# Patient Record
Sex: Male | Born: 1935 | Race: White | Hispanic: No | Smoking: Former smoker
Health system: Southern US, Community
[De-identification: ages and names within clinical notes are randomized; demographics above are authoritative.]

## PROBLEM LIST (undated history)

## (undated) DIAGNOSIS — R002 Palpitations: Secondary | ICD-10-CM

## (undated) DIAGNOSIS — Z9289 Personal history of other medical treatment: Secondary | ICD-10-CM

## (undated) DIAGNOSIS — H3530 Unspecified macular degeneration: Secondary | ICD-10-CM

## (undated) DIAGNOSIS — D509 Iron deficiency anemia, unspecified: Secondary | ICD-10-CM

## (undated) DIAGNOSIS — I10 Essential (primary) hypertension: Secondary | ICD-10-CM

## (undated) DIAGNOSIS — N529 Male erectile dysfunction, unspecified: Secondary | ICD-10-CM

## (undated) DIAGNOSIS — F0390 Unspecified dementia without behavioral disturbance: Secondary | ICD-10-CM

## (undated) DIAGNOSIS — E039 Hypothyroidism, unspecified: Secondary | ICD-10-CM

## (undated) DIAGNOSIS — R0789 Other chest pain: Secondary | ICD-10-CM

## (undated) DIAGNOSIS — Z87442 Personal history of urinary calculi: Secondary | ICD-10-CM

## (undated) DIAGNOSIS — E785 Hyperlipidemia, unspecified: Secondary | ICD-10-CM

## (undated) DIAGNOSIS — R9389 Abnormal findings on diagnostic imaging of other specified body structures: Secondary | ICD-10-CM

## (undated) DIAGNOSIS — I509 Heart failure, unspecified: Secondary | ICD-10-CM

## (undated) HISTORY — DX: Hyperlipidemia, unspecified: E78.5

## (undated) HISTORY — DX: Unspecified macular degeneration: H35.30

## (undated) HISTORY — DX: Palpitations: R00.2

## (undated) HISTORY — DX: Personal history of urinary calculi: Z87.442

## (undated) HISTORY — DX: Other chest pain: R07.89

## (undated) HISTORY — DX: Personal history of other medical treatment: Z92.89

## (undated) HISTORY — PX: CATARACT EXTRACTION: SUR2

## (undated) HISTORY — DX: Essential (primary) hypertension: I10

## (undated) HISTORY — DX: Male erectile dysfunction, unspecified: N52.9

---

## 1969-03-19 HISTORY — PX: THORACOTOMY: SUR1349

## 1969-03-19 HISTORY — PX: LOBECTOMY: SHX5089

## 1997-06-30 ENCOUNTER — Other Ambulatory Visit: Admission: RE | Admit: 1997-06-30 | Discharge: 1997-06-30 | Payer: Self-pay | Admitting: Cardiology

## 1998-08-01 ENCOUNTER — Emergency Department (HOSPITAL_COMMUNITY): Admission: EM | Admit: 1998-08-01 | Discharge: 1998-08-02 | Payer: Self-pay

## 1998-08-05 ENCOUNTER — Ambulatory Visit (HOSPITAL_COMMUNITY): Admission: RE | Admit: 1998-08-05 | Discharge: 1998-08-05 | Payer: Self-pay | Admitting: Gastroenterology

## 1998-08-05 ENCOUNTER — Encounter: Payer: Self-pay | Admitting: Gastroenterology

## 1998-08-08 ENCOUNTER — Encounter: Payer: Self-pay | Admitting: Urology

## 1998-08-08 ENCOUNTER — Ambulatory Visit (HOSPITAL_COMMUNITY): Admission: RE | Admit: 1998-08-08 | Discharge: 1998-08-08 | Payer: Self-pay | Admitting: Urology

## 1998-09-21 ENCOUNTER — Encounter: Payer: Self-pay | Admitting: Urology

## 1998-09-21 ENCOUNTER — Ambulatory Visit (HOSPITAL_COMMUNITY): Admission: RE | Admit: 1998-09-21 | Discharge: 1998-09-21 | Payer: Self-pay | Admitting: Urology

## 1998-12-20 ENCOUNTER — Ambulatory Visit (HOSPITAL_COMMUNITY): Admission: RE | Admit: 1998-12-20 | Discharge: 1998-12-20 | Payer: Self-pay | Admitting: Ophthalmology

## 2002-10-08 HISTORY — PX: CARDIOVASCULAR STRESS TEST: SHX262

## 2003-07-22 ENCOUNTER — Encounter: Admission: RE | Admit: 2003-07-22 | Discharge: 2003-07-22 | Payer: Self-pay | Admitting: Critical Care Medicine

## 2007-03-20 HISTORY — PX: COLONOSCOPY: SHX174

## 2007-11-18 ENCOUNTER — Ambulatory Visit: Payer: Self-pay | Admitting: Internal Medicine

## 2007-12-02 ENCOUNTER — Ambulatory Visit: Payer: Self-pay | Admitting: Internal Medicine

## 2007-12-02 ENCOUNTER — Encounter: Payer: Self-pay | Admitting: Internal Medicine

## 2007-12-03 ENCOUNTER — Encounter: Payer: Self-pay | Admitting: Internal Medicine

## 2009-03-19 DIAGNOSIS — F0390 Unspecified dementia without behavioral disturbance: Secondary | ICD-10-CM

## 2009-03-19 HISTORY — DX: Unspecified dementia, unspecified severity, without behavioral disturbance, psychotic disturbance, mood disturbance, and anxiety: F03.90

## 2009-12-12 ENCOUNTER — Ambulatory Visit: Payer: Self-pay | Admitting: Cardiology

## 2010-04-17 ENCOUNTER — Ambulatory Visit: Payer: Self-pay | Admitting: Cardiology

## 2010-08-08 ENCOUNTER — Other Ambulatory Visit: Payer: Self-pay | Admitting: *Deleted

## 2010-08-08 DIAGNOSIS — E039 Hypothyroidism, unspecified: Secondary | ICD-10-CM

## 2010-08-08 MED ORDER — LEVOTHYROXINE SODIUM 25 MCG PO TABS: ORAL_TABLET | ORAL | Status: DC

## 2010-08-08 NOTE — Telephone Encounter (Signed)
Refilled meds per fax request.  

## 2010-11-29 ENCOUNTER — Encounter: Payer: Self-pay | Admitting: Cardiology

## 2010-12-07 ENCOUNTER — Other Ambulatory Visit: Payer: Self-pay | Admitting: Cardiology

## 2010-12-07 ENCOUNTER — Other Ambulatory Visit (INDEPENDENT_AMBULATORY_CARE_PROVIDER_SITE_OTHER): Payer: Medicare Other | Admitting: *Deleted

## 2010-12-07 ENCOUNTER — Ambulatory Visit (INDEPENDENT_AMBULATORY_CARE_PROVIDER_SITE_OTHER): Payer: Medicare Other | Admitting: Cardiology

## 2010-12-07 ENCOUNTER — Encounter: Payer: Self-pay | Admitting: Cardiology

## 2010-12-07 VITALS — BP 120/76 | HR 70 | Wt 178.0 lb

## 2010-12-07 DIAGNOSIS — R0609 Other forms of dyspnea: Secondary | ICD-10-CM

## 2010-12-07 DIAGNOSIS — D3A090 Benign carcinoid tumor of the bronchus and lung: Secondary | ICD-10-CM | POA: Insufficient documentation

## 2010-12-07 DIAGNOSIS — R51 Headache: Secondary | ICD-10-CM

## 2010-12-07 DIAGNOSIS — R413 Other amnesia: Secondary | ICD-10-CM

## 2010-12-07 DIAGNOSIS — E785 Hyperlipidemia, unspecified: Secondary | ICD-10-CM

## 2010-12-07 DIAGNOSIS — F0390 Unspecified dementia without behavioral disturbance: Secondary | ICD-10-CM | POA: Insufficient documentation

## 2010-12-07 DIAGNOSIS — E7800 Pure hypercholesterolemia, unspecified: Secondary | ICD-10-CM | POA: Insufficient documentation

## 2010-12-07 DIAGNOSIS — R0602 Shortness of breath: Secondary | ICD-10-CM | POA: Insufficient documentation

## 2010-12-07 DIAGNOSIS — I10 Essential (primary) hypertension: Secondary | ICD-10-CM

## 2010-12-07 DIAGNOSIS — I119 Hypertensive heart disease without heart failure: Secondary | ICD-10-CM | POA: Insufficient documentation

## 2010-12-07 LAB — BASIC METABOLIC PANEL
CO2: 27 mEq/L (ref 19–32)
Chloride: 106 mEq/L (ref 96–112)
Creatinine, Ser: 1.1 mg/dL (ref 0.4–1.5)
Glucose, Bld: 91 mg/dL (ref 70–99)

## 2010-12-07 LAB — HEPATIC FUNCTION PANEL
Albumin: 4.2 g/dL (ref 3.5–5.2)
Alkaline Phosphatase: 72 U/L (ref 39–117)
Total Bilirubin: 0.7 mg/dL (ref 0.3–1.2)

## 2010-12-07 LAB — LIPID PANEL
LDL Cholesterol: 80 mg/dL (ref 0–99)
Total CHOL/HDL Ratio: 4
Triglycerides: 132 mg/dL (ref 0.0–149.0)

## 2010-12-07 NOTE — Progress Notes (Signed)
Michael Bradley Date of Birth:  Jul 12, 1925 [facility] [address]   [address] [place], [place]  [address] [phone]           Fax   [phone]  History of Present Illness: This pleasant elderly gentleman is seen for a scheduled six-month followup office visit he has a history of essential hypertension and history of hypercholesterolemia.  He does not have any history of coronary disease.  He had a normal Cardiolite stress test in 2004.  He does have some mild exertional dyspnea per his main problem has been increasing forgetfulness and his wife is very concerned about his memory.  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  He has a history of diverticulosis and had a recent colonoscopy by Dr. Lina Sar.  Current Outpatient Prescriptions  Medication Sig Dispense Refill  . aspirin 81 MG tablet Take 81 mg by mouth daily.        . cyclobenzaprine (FLEXERIL) 10 MG tablet Take 10 mg by mouth 2 (two) times daily as needed.        . galantamine (RAZADYNE) 8 MG tablet Take 8 mg by mouth daily.        . hydrochlorothiazide 25 MG tablet Take 12.5 mg by mouth daily.        Marland Kitchen levothyroxine (LEVOTHROID) 25 MCG tablet 1 tablet daily except 2 tablets on wed and sun  50 tablet  11  . lisinopril (PRINIVIL,ZESTRIL) 20 MG tablet Take 10 mg by mouth daily.        Marland Kitchen LORazepam (ATIVAN) 0.5 MG tablet Take 0.5 mg by mouth every 8 (eight) hours.        . meclizine (ANTIVERT) 25 MG tablet Take 25 mg by mouth daily. As needed      . Multiple Vitamin (MULTIVITAMIN) tablet Take 1 tablet by mouth daily.        . nitroGLYCERIN (NITROSTAT) 0.4 MG SL tablet Place 0.4 mg under the tongue every 5 (five) minutes as needed.        Marland Kitchen omeprazole (PRILOSEC) 20 MG capsule Take 20 mg by mouth daily.        . simvastatin (ZOCOR) 80 MG tablet Take 80 mg by mouth at bedtime.        Marland Kitchen terazosin (HYTRIN) 2 MG  capsule Take 2 mg by mouth at bedtime.        . vitamin C (ASCORBIC ACID) 500 MG tablet Take 500 mg by mouth daily.        . vitamin E 200 UNIT capsule Take 200 Units by mouth daily.          Allergies  Allergen Reactions  . Lisinopril Cough  . Quinolones     There is no problem list on file for this patient.   History  Smoking status  . Former Smoker  . Quit date: 11/28/1980  Smokeless tobacco  . Not on file    History  Alcohol Use No    Family History  Problem Relation Age of Onset  . Lung cancer Father   . Lung cancer Brother     Review of Systems: Constitutional: no fever chills diaphoresis or fatigue or change in weight.  Head and neck: no hearing loss, no epistaxis, no photophobia or visual disturbance. Respiratory: No cough, shortness of breath or wheezing. Cardiovascular: No chest pain peripheral edema, palpitations. Gastrointestinal: No  abdominal distention, no abdominal pain, no change in bowel habits hematochezia or melena. Genitourinary: No dysuria, no frequency, no urgency, no nocturia. Musculoskeletal:No arthralgias, no back pain, no gait disturbance or myalgias. Neurological: No dizziness, no headaches, no numbness, no seizures, no syncope, no weakness, no tremors. Hematologic: No lymphadenopathy, no easy bruising. Psychiatric: No confusion, no hallucinations, no sleep disturbance.    Physical Exam: Filed Vitals:   12/07/10 1337  BP: 120/76  Pulse: 70  The general appearance reveals a pleasant elderly gentleman in no distress.Pupils equal and reactive.   Extraocular Movements are full.  There is no scleral icterus.  The mouth and pharynx are normal.  The neck is supple.  The carotids reveal no bruits.  The jugular venous pressure is normal.  The thyroid is not enlarged.  There is no lymphadenopathy.    The chest reveals diminished breath sounds at the right base.  The heart reveals a soft systolic ejection murmur at the base.  No diastolic murmur.   No gallop or rub.The abdomen is soft and nontender. Bowel sounds are normal. The liver and spleen are not enlarged. There Are no abdominal masses. There are no bruits.  The pedal pulses are good.  There is no phlebitis or edema.  There is no cyanosis or clubbing.  Strength is normal and symmetrical in all extremities.  There is no lateralizing weakness.  There are no sensory deficits.  The skin is warm and dry.  There is no rash.     Assessment / Plan: Continue same medication.  Keep neurology appointment as scheduled.  Recheck here in 4 months for followup office visit and fasting lab work

## 2010-12-07 NOTE — Assessment & Plan Note (Signed)
The patient has a history of dementia.  His wife is very concerned about him.  We have scheduled him for a neurology consultation with Dr. Avie Echevaria In the near future.

## 2010-12-07 NOTE — Assessment & Plan Note (Signed)
Patient has a history of exertional dyspnea.  He has not been experiencing any sputum production.  He does have a past history of resection of his right middle lobe and right lower lobe because of a carcinoid lung tumor in 1971.

## 2010-12-07 NOTE — Assessment & Plan Note (Signed)
The patient has not been experiencing any exertional chest pain.  He denies any dizzy spells.  He does have occasional right-sided headaches.

## 2010-12-07 NOTE — Assessment & Plan Note (Signed)
The patient has a history of hypercholesterolemia.  He is presently on full dose simvastatin 80 mg daily a dose that he has been on for several years.His LDL cholesterol today is 80

## 2010-12-08 ENCOUNTER — Telehealth: Payer: Self-pay | Admitting: Cardiology

## 2010-12-08 ENCOUNTER — Telehealth: Payer: Self-pay | Admitting: *Deleted

## 2010-12-08 NOTE — Telephone Encounter (Signed)
Advised patient of lab results and continue his same regimen

## 2010-12-08 NOTE — Progress Notes (Signed)
Routed lab results to [facility]

## 2010-12-08 NOTE — Progress Notes (Signed)
Advised patient of lab results  

## 2010-12-08 NOTE — Telephone Encounter (Signed)
Diane calling from [facility] needing most recent office visit notes faxed over.

## 2010-12-08 NOTE — Telephone Encounter (Signed)
Message copied by Royanne Foots on Fri Dec 08, 2010 10:01 AM ------      Message from: Cassell Clement      Created: Thu Dec 07, 2010  9:38 PM       Please report.  Blood work is all normal.  Continue present regimen.

## 2010-12-08 NOTE — Telephone Encounter (Signed)
Done

## 2010-12-13 ENCOUNTER — Other Ambulatory Visit: Payer: Self-pay | Admitting: Neurology

## 2010-12-13 DIAGNOSIS — F0390 Unspecified dementia without behavioral disturbance: Secondary | ICD-10-CM

## 2010-12-13 DIAGNOSIS — E785 Hyperlipidemia, unspecified: Secondary | ICD-10-CM

## 2010-12-16 ENCOUNTER — Ambulatory Visit
Admission: RE | Admit: 2010-12-16 | Discharge: 2010-12-16 | Disposition: A | Discharge status: Home / Self Care | Payer: Medicare Other | Source: Ambulatory Visit | Attending: Neurology | Admitting: Neurology

## 2010-12-16 DIAGNOSIS — E785 Hyperlipidemia, unspecified: Secondary | ICD-10-CM

## 2010-12-16 DIAGNOSIS — F0390 Unspecified dementia without behavioral disturbance: Secondary | ICD-10-CM

## 2011-03-20 DIAGNOSIS — E785 Hyperlipidemia, unspecified: Secondary | ICD-10-CM

## 2011-03-20 HISTORY — DX: Hyperlipidemia, unspecified: E78.5

## 2011-04-03 DIAGNOSIS — F068 Other specified mental disorders due to known physiological condition: Secondary | ICD-10-CM | POA: Diagnosis not present

## 2011-04-03 DIAGNOSIS — H8110 Benign paroxysmal vertigo, unspecified ear: Secondary | ICD-10-CM | POA: Diagnosis not present

## 2011-04-10 ENCOUNTER — Encounter: Payer: Self-pay | Admitting: Cardiology

## 2011-04-10 ENCOUNTER — Other Ambulatory Visit: Payer: Medicare Other | Admitting: *Deleted

## 2011-04-10 ENCOUNTER — Ambulatory Visit (INDEPENDENT_AMBULATORY_CARE_PROVIDER_SITE_OTHER): Payer: Medicare Other | Admitting: Cardiology

## 2011-04-10 VITALS — BP 140/84 | HR 70 | Ht 62.0 in | Wt 178.0 lb

## 2011-04-10 DIAGNOSIS — F0390 Unspecified dementia without behavioral disturbance: Secondary | ICD-10-CM | POA: Diagnosis not present

## 2011-04-10 DIAGNOSIS — E7800 Pure hypercholesterolemia, unspecified: Secondary | ICD-10-CM | POA: Diagnosis not present

## 2011-04-10 DIAGNOSIS — I119 Hypertensive heart disease without heart failure: Secondary | ICD-10-CM | POA: Diagnosis not present

## 2011-04-10 LAB — HEPATIC FUNCTION PANEL
Bilirubin, Direct: 0.1 mg/dL (ref 0.0–0.3)
Total Bilirubin: 0.7 mg/dL (ref 0.3–1.2)

## 2011-04-10 LAB — LIPID PANEL: Total CHOL/HDL Ratio: 4

## 2011-04-10 LAB — BASIC METABOLIC PANEL
BUN: 16 mg/dL (ref 6–23)
Chloride: 109 mEq/L (ref 96–112)
Glucose, Bld: 82 mg/dL (ref 70–99)
Potassium: 3.7 mEq/L (ref 3.5–5.1)

## 2011-04-10 NOTE — Assessment & Plan Note (Signed)
The patient is on a large dose of simvastatin 80 mg daily which she has been on  several years without side effects.  Blood work today is pending.  He is not having any myalgias or other side effects.

## 2011-04-10 NOTE — Progress Notes (Signed)
Michael Bradley Date of Birth:  08-03-1925 [facility] [address] [place], [place]  [address] [phone]         Fax   [phone]  History of Present Illness: This pleasant 78 year old gentleman is seen for a scheduled four-month followup office visit.  He has a history of essential hypertension, hypercholesterolemia, and problems with his memory.  He has been seen neurology about his memory problems and has improved significantly since being placed on Razadyne and he is up to 24 mg daily.  The patient reports that he had a recent MRI of the brain which was satisfactory according to the patient.  The patient also has a history of carcinoid tumor of the wound which was removed from the right middle lobe and right lower lobe in 1971.  He has been experiencing some right flank pain which may be related to postoperative adhesions.  Current Outpatient Prescriptions  Medication Sig Dispense Refill  . aspirin 81 MG tablet Take 81 mg by mouth daily.        . cyclobenzaprine (FLEXERIL) 10 MG tablet Take 10 mg by mouth 2 (two) times daily as needed.        . galantamine (RAZADYNE) 8 MG tablet Take 8 mg by mouth daily.        . hydrochlorothiazide 25 MG tablet Take 12.5 mg by mouth daily.        Marland Kitchen levothyroxine (LEVOTHROID) 25 MCG tablet 1 tablet daily except 2 tablets on wed and sun  50 tablet  11  . lisinopril (PRINIVIL,ZESTRIL) 20 MG tablet Take 10 mg by mouth daily.        Marland Kitchen LORazepam (ATIVAN) 0.5 MG tablet Take 0.5 mg by mouth every 8 (eight) hours.        . meclizine (ANTIVERT) 25 MG tablet Take 25 mg by mouth daily. As needed      . nitroGLYCERIN (NITROSTAT) 0.4 MG SL tablet Place 0.4 mg under the tongue every 5 (five) minutes as needed.        Marland Kitchen omeprazole (PRILOSEC) 20 MG capsule Take 20 mg by mouth daily.        . simvastatin (ZOCOR) 80 MG tablet Take 80 mg by mouth at bedtime.        Marland Kitchen terazosin (HYTRIN) 2 MG capsule Take 2 mg by mouth at bedtime.           Allergies  Allergen Reactions  . Lisinopril Cough  . Quinolones     Patient Active Problem List  Diagnoses  . Dementia  . Hypercholesterolemia  . Benign hypertensive heart disease without heart failure  . Carcinoid tumor of lung  . Dyspnea on exertion    History  Smoking status  . Former Smoker  . Quit date: 11/28/1980  Smokeless tobacco  . Not on file    History  Alcohol Use No    Family History  Problem Relation Age of Onset  . Lung cancer Father   . Lung cancer Brother     Review of Systems: Constitutional: no fever chills diaphoresis or fatigue or change in weight.  Head and neck: no hearing loss, no epistaxis, no photophobia or visual disturbance. Respiratory: No cough, shortness of breath or wheezing. Cardiovascular: No chest pain peripheral edema, palpitations. Gastrointestinal: No abdominal distention, no abdominal pain, no change in bowel habits hematochezia or melena. Genitourinary: No dysuria, no frequency, no urgency, no nocturia. Musculoskeletal:No arthralgias, no back pain, no gait disturbance or myalgias. Neurological: No dizziness, no  headaches, no numbness, no seizures, no syncope, no weakness, no tremors. Hematologic: No lymphadenopathy, no easy bruising. Psychiatric: No confusion, no hallucinations, no sleep disturbance.    Physical Exam: Filed Vitals:   04/10/11 1154  BP: 140/84  Pulse: 70   the general appearance reveals a well-developed well-nourished elderly gentleman in no distress.The head and neck exam reveals pupils equal and reactive.  Extraocular movements are full.  There is no scleral icterus.  The mouth and pharynx are normal.  The neck is supple.  The carotids reveal no bruits.  The jugular venous pressure is normal.  The  thyroid is not enlarged.  There is no lymphadenopathy.  The chest is clear to percussion and auscultation.  There are no rales or rhonchi.  Expansion of the chest is symmetrical.  The breath sounds at the  right base are chronically diminished because of his previous surgery.   The precordium is quiet.  The first heart sound is normal.  The second heart sound is physiologically split.  There is no murmur gallop rub or click.  There is no abnormal lift or heave.  The abdomen is soft and nontender.  The bowel sounds are normal.  The liver and spleen are not enlarged.  There are no abdominal masses.  There are no abdominal bruits.  Extremities reveal good pedal pulses.  There is no phlebitis or edema.  There is no cyanosis or clubbing.  Strength is normal and symmetrical in all extremities.  There is no lateralizing weakness.  There are no sensory deficits.  The skin is warm and dry.  There is no rash.     Assessment / Plan: Continue same medication.  Try to increase aerobic activity.  He is going to take some acetaminophen for his right flank pain.  He will also try to increase his daily walk her intake.  He is concerned about kidney stones developing.  He'll be rechecked in 4 months for followup office visit and lab work.

## 2011-04-10 NOTE — Assessment & Plan Note (Signed)
The patient denies any chest pain or angina.  Is not having any increased dyspnea.  The patient has not been aware of any palpitations or tachycardia.

## 2011-04-10 NOTE — Patient Instructions (Signed)
Will obtain labs today and call you with the results  Your physician recommends that you continue on your current medications as directed. Please refer to the Current Medication list given to you today. Your physician wants you to follow-up in: 4 months  You will receive a reminder letter in the mail two months in advance. If you don't receive a letter, please call our office to schedule the follow-up appointment.  

## 2011-04-10 NOTE — Assessment & Plan Note (Signed)
The patient and his wife state that his dementia symptoms have improved.  He was able to drive by himself to the beach over the [employer] holiday weekend.

## 2011-04-13 ENCOUNTER — Telehealth: Payer: Self-pay | Admitting: *Deleted

## 2011-04-13 NOTE — Telephone Encounter (Signed)
Message copied by Burnell Blanks on Fri Apr 13, 2011  9:04 AM ------      Message from: Cassell Clement      Created: Wed Apr 11, 2011  9:22 AM       Please report.  The labs are stable.  Continue same meds.  Continue careful diet.

## 2011-04-13 NOTE — Telephone Encounter (Signed)
Advised wife and mailed copy 

## 2011-06-18 ENCOUNTER — Telehealth: Payer: Self-pay | Admitting: Cardiology

## 2011-06-18 NOTE — Telephone Encounter (Signed)
Please return call to patient wife Talbert Forest [phone]  Patient is experiencing nausea, wife would like to know what medication is safe to give him. Please return call to patient wife at [phone].

## 2011-06-18 NOTE — Telephone Encounter (Signed)
SPOKE WITH PT'S WIFE  PT HAS BEEN VOMMITTING  SINCE  3 PM YESTERDAY AND  STOMACH  IS GRUMBLING PER PT  WIFE WANTING TO KNOW IF  PT COULD TAKE  PROCHLORPERAZINE   THIS IS WIFE'S MED FOR DIVERTICULITIS INSTRUCTED  PT'S WIFE TO KEEP PT HYDRATED AND TO TRY OTC NAUSEA MED  AND LET IT RUN IT'S COURSE  WILL FORWARD TO DR Patty Sermons FOR REVIEW .Zack Seal

## 2011-06-18 NOTE — Telephone Encounter (Signed)
Agree with advice given

## 2011-07-18 DIAGNOSIS — H8110 Benign paroxysmal vertigo, unspecified ear: Secondary | ICD-10-CM | POA: Diagnosis not present

## 2011-08-16 ENCOUNTER — Ambulatory Visit (INDEPENDENT_AMBULATORY_CARE_PROVIDER_SITE_OTHER): Payer: Medicare Other | Admitting: Cardiology

## 2011-08-16 ENCOUNTER — Other Ambulatory Visit (INDEPENDENT_AMBULATORY_CARE_PROVIDER_SITE_OTHER): Payer: Medicare Other

## 2011-08-16 ENCOUNTER — Encounter: Payer: Self-pay | Admitting: Cardiology

## 2011-08-16 VITALS — BP 110/56 | HR 61 | Ht 62.0 in | Wt 176.0 lb

## 2011-08-16 DIAGNOSIS — I119 Hypertensive heart disease without heart failure: Secondary | ICD-10-CM | POA: Diagnosis not present

## 2011-08-16 DIAGNOSIS — R42 Dizziness and giddiness: Secondary | ICD-10-CM | POA: Diagnosis not present

## 2011-08-16 DIAGNOSIS — F0390 Unspecified dementia without behavioral disturbance: Secondary | ICD-10-CM

## 2011-08-16 DIAGNOSIS — E7800 Pure hypercholesterolemia, unspecified: Secondary | ICD-10-CM

## 2011-08-16 DIAGNOSIS — E039 Hypothyroidism, unspecified: Secondary | ICD-10-CM | POA: Diagnosis not present

## 2011-08-16 LAB — LIPID PANEL
HDL: 33.1 mg/dL — ABNORMAL LOW (ref >39.00)
Triglycerides: 137 mg/dL (ref 0.0–149.0)

## 2011-08-16 LAB — BASIC METABOLIC PANEL
CO2: 24 mEq/L (ref 19–32)
Calcium: 9.2 mg/dL (ref 8.4–10.5)
Creatinine, Ser: 1.5 mg/dL (ref 0.4–1.5)
Glucose, Bld: 96 mg/dL (ref 70–99)

## 2011-08-16 LAB — HEPATIC FUNCTION PANEL
Albumin: 4.2 g/dL (ref 3.5–5.2)
Alkaline Phosphatase: 67 U/L (ref 39–117)
Total Protein: 7.3 g/dL (ref 6.0–8.3)

## 2011-08-16 MED ORDER — MECLIZINE HCL 25 MG PO TABS: 25.0000 mg | ORAL_TABLET | Freq: Every day | ORAL | Status: DC

## 2011-08-16 NOTE — Patient Instructions (Signed)
Your physician recommends that you continue on your current medications as directed. Please refer to the Current Medication list given to you today. Your physician wants you to follow-up in: 4 months with fasting labs You will receive a reminder letter in the mail two months in advance. If you don't receive a letter, please call our office to schedule the follow-up appointment.  

## 2011-08-16 NOTE — Progress Notes (Signed)
Michael Bradley Date of Birth:  1925-11-29 [facility] [address] [place], [place]  [address] [phone]         Fax   [phone]  History of Present Illness: This pleasant 78 year old veteran who served in the [place] [employer] is seen for a scheduled followup office visit.  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  He has a history of progressive dementia.  Current Outpatient Prescriptions  Medication Sig Dispense Refill  . aspirin 81 MG tablet Take 81 mg by mouth daily.        . cyclobenzaprine (FLEXERIL) 10 MG tablet Take 10 mg by mouth 2 (two) times daily as needed.        . galantamine (RAZADYNE) 8 MG tablet Take 8 mg by mouth 2 (two) times daily.       . hydrochlorothiazide 25 MG tablet Take 12.5 mg by mouth daily.        Marland Kitchen levothyroxine (LEVOTHROID) 25 MCG tablet 1 tablet daily except 2 tablets on wed and sun  50 tablet  11  . lisinopril (PRINIVIL,ZESTRIL) 20 MG tablet Take 10 mg by mouth daily.        Marland Kitchen LORazepam (ATIVAN) 0.5 MG tablet Take 0.5 mg by mouth every 8 (eight) hours.        . meclizine (ANTIVERT) 25 MG tablet Take 1 tablet (25 mg total) by mouth daily. As needed  30 tablet  3  . nitroGLYCERIN (NITROSTAT) 0.4 MG SL tablet Place 0.4 mg under the tongue every 5 (five) minutes as needed.        Marland Kitchen omeprazole (PRILOSEC) 20 MG capsule Take 20 mg by mouth at bedtime.       . simvastatin (ZOCOR) 80 MG tablet Take 80 mg by mouth at bedtime.        Marland Kitchen terazosin (HYTRIN) 2 MG capsule Take 2 mg by mouth at bedtime.          Allergies  Allergen Reactions  . Lisinopril Cough  . Quinolones     Patient Active Problem List  Diagnoses  . Dementia  . Hypercholesterolemia  . Benign hypertensive heart disease without heart failure  . Carcinoid tumor of lung  . Dyspnea on exertion  . Hypothyroid    History  Smoking status    . Former Smoker  . Quit date: 11/28/1980  Smokeless tobacco  . Not on file    History  Alcohol Use No    Family History  Problem Relation Age of Onset  . Lung cancer Father   . Lung cancer Brother     Review of Systems: Constitutional: no fever chills diaphoresis or fatigue or change in weight.  Head and neck: no hearing loss, no epistaxis, no photophobia or visual disturbance. Respiratory: No cough, shortness of breath or wheezing. Cardiovascular: No chest pain peripheral edema, palpitations. Gastrointestinal: No abdominal distention, no abdominal pain, no change in bowel habits hematochezia or melena. Genitourinary: No dysuria, no frequency, no urgency, no nocturia. Musculoskeletal:No arthralgias, no back pain, no gait disturbance or myalgias. Neurological: No dizziness, no headaches, no numbness, no seizures, no syncope, no weakness, no tremors. Hematologic: No lymphadenopathy, no easy bruising. Psychiatric: No confusion, no hallucinations, no sleep disturbance.    Physical Exam: Filed Vitals:   08/16/11 1401  BP: 110/56  Pulse: 61   the general appearance reveals an  elderly gentleman in no acute distress.Pupils equal and reactive.   Extraocular Movements are full.  There is no scleral icterus.  The mouth and pharynx are normal.  The neck is supple.  The carotids reveal no bruits.  The jugular venous pressure is normal.  The thyroid is not enlarged.  There is no lymphadenopathy.  There is no nystagmus. The chest reveals decreased breath sounds at the right base. The precordium is quiet.  The first heart sound is normal.  The second heart sound is physiologically split.  There is no murmur gallop rub or click.  There is no abnormal lift or heave.  The abdomen is soft and nontender. Bowel sounds are normal. The liver and spleen are not enlarged. There Are no abdominal masses. There are no bruits.  The pedal pulses are good.  There is no phlebitis or edema.  There is no  cyanosis or clubbing. Strength is normal and symmetrical in all extremities.  There is no lateralizing weakness.  There are no sensory deficits.       Assessment / Plan: Continue same medication.  Add meclozine 25 mg 3 times a day when necessary.  Recheck in 4 months for a followup office visit lipid panel hepatic function panel and basal metabolic panel.  I gave him a written prescription for a rolling walker with a seat today

## 2011-08-16 NOTE — Assessment & Plan Note (Signed)
According to his wife his dementia is getting slowly progressively worse.  Fortunately he is still able to drive with his wife functioning as his copilot.

## 2011-08-16 NOTE — Assessment & Plan Note (Signed)
The patient has had chronic exertional dyspnea which is no worse.  He is not having any overt symptoms of CHF.  He does have decreased breath sounds at the right lower lobe secondary to his previous lung resection

## 2011-08-16 NOTE — Progress Notes (Signed)
Quick Note:  Please report to patient. The recent labs are stable. Continue same medication and careful diet. ______ 

## 2011-08-16 NOTE — Assessment & Plan Note (Signed)
Recently the patient has had worsening problems with balance and problems with vertigo.  He is requesting a prescription for meclizine which she has used successfully in the past.  He is also requesting a prescription for a rolling walker similar to what his wife has.  He has recently been in the habit of burrowing his wife's walker and he needs his own.  Patient has recently seen Dr. love, his neurologist, about his vertigo and has had some vertigo physical therapy maneuvers administered.

## 2011-08-17 ENCOUNTER — Telehealth: Payer: Self-pay | Admitting: *Deleted

## 2011-08-17 NOTE — Telephone Encounter (Signed)
Message copied by Burnell Blanks on Fri Aug 17, 2011 10:21 AM ------      Message from: Cassell Clement      Created: Thu Aug 16, 2011  8:38 PM       Please report to patient.  The recent labs are stable. Continue same medication and careful diet.

## 2011-08-17 NOTE — Telephone Encounter (Signed)
Advised wife of labs 

## 2011-08-27 ENCOUNTER — Other Ambulatory Visit (HOSPITAL_COMMUNITY): Payer: Self-pay | Admitting: *Deleted

## 2011-08-27 DIAGNOSIS — E039 Hypothyroidism, unspecified: Secondary | ICD-10-CM

## 2011-08-27 MED ORDER — LEVOTHYROXINE SODIUM 25 MCG PO TABS: ORAL_TABLET | ORAL | Status: DC

## 2011-08-27 NOTE — Telephone Encounter (Signed)
Refilled levothyroxine.

## 2011-09-13 DIAGNOSIS — H8110 Benign paroxysmal vertigo, unspecified ear: Secondary | ICD-10-CM | POA: Diagnosis not present

## 2011-09-13 DIAGNOSIS — F068 Other specified mental disorders due to known physiological condition: Secondary | ICD-10-CM | POA: Diagnosis not present

## 2011-12-14 DIAGNOSIS — Z23 Encounter for immunization: Secondary | ICD-10-CM | POA: Diagnosis not present

## 2011-12-26 DIAGNOSIS — H47329 Drusen of optic disc, unspecified eye: Secondary | ICD-10-CM | POA: Diagnosis not present

## 2011-12-26 DIAGNOSIS — H3530 Unspecified macular degeneration: Secondary | ICD-10-CM | POA: Diagnosis not present

## 2011-12-26 DIAGNOSIS — H35359 Cystoid macular degeneration, unspecified eye: Secondary | ICD-10-CM | POA: Diagnosis not present

## 2011-12-31 ENCOUNTER — Encounter (INDEPENDENT_AMBULATORY_CARE_PROVIDER_SITE_OTHER): Payer: Medicare Other | Admitting: Ophthalmology

## 2011-12-31 ENCOUNTER — Encounter: Payer: Self-pay | Admitting: Cardiology

## 2011-12-31 DIAGNOSIS — I10 Essential (primary) hypertension: Secondary | ICD-10-CM

## 2011-12-31 DIAGNOSIS — H3530 Unspecified macular degeneration: Secondary | ICD-10-CM

## 2011-12-31 DIAGNOSIS — H35039 Hypertensive retinopathy, unspecified eye: Secondary | ICD-10-CM

## 2011-12-31 DIAGNOSIS — H353290 Exudative age-related macular degeneration, unspecified eye, stage unspecified: Secondary | ICD-10-CM

## 2011-12-31 DIAGNOSIS — H43819 Vitreous degeneration, unspecified eye: Secondary | ICD-10-CM

## 2012-01-03 ENCOUNTER — Ambulatory Visit (INDEPENDENT_AMBULATORY_CARE_PROVIDER_SITE_OTHER): Payer: Medicare Other | Admitting: Ophthalmology

## 2012-01-03 DIAGNOSIS — I10 Essential (primary) hypertension: Secondary | ICD-10-CM

## 2012-01-03 DIAGNOSIS — H35039 Hypertensive retinopathy, unspecified eye: Secondary | ICD-10-CM

## 2012-01-03 DIAGNOSIS — H43819 Vitreous degeneration, unspecified eye: Secondary | ICD-10-CM

## 2012-01-03 DIAGNOSIS — H353290 Exudative age-related macular degeneration, unspecified eye, stage unspecified: Secondary | ICD-10-CM

## 2012-01-03 DIAGNOSIS — H3530 Unspecified macular degeneration: Secondary | ICD-10-CM

## 2012-01-14 DIAGNOSIS — H8110 Benign paroxysmal vertigo, unspecified ear: Secondary | ICD-10-CM | POA: Diagnosis not present

## 2012-01-14 DIAGNOSIS — F068 Other specified mental disorders due to known physiological condition: Secondary | ICD-10-CM | POA: Diagnosis not present

## 2012-01-14 DIAGNOSIS — H3530 Unspecified macular degeneration: Secondary | ICD-10-CM | POA: Diagnosis not present

## 2012-01-25 ENCOUNTER — Encounter (INDEPENDENT_AMBULATORY_CARE_PROVIDER_SITE_OTHER): Payer: Medicare Other | Admitting: Ophthalmology

## 2012-01-25 DIAGNOSIS — H43819 Vitreous degeneration, unspecified eye: Secondary | ICD-10-CM

## 2012-01-25 DIAGNOSIS — H353290 Exudative age-related macular degeneration, unspecified eye, stage unspecified: Secondary | ICD-10-CM

## 2012-01-25 DIAGNOSIS — H3530 Unspecified macular degeneration: Secondary | ICD-10-CM

## 2012-01-25 DIAGNOSIS — I10 Essential (primary) hypertension: Secondary | ICD-10-CM

## 2012-01-25 DIAGNOSIS — H35039 Hypertensive retinopathy, unspecified eye: Secondary | ICD-10-CM

## 2012-02-26 ENCOUNTER — Encounter (INDEPENDENT_AMBULATORY_CARE_PROVIDER_SITE_OTHER): Payer: Medicare Other | Admitting: Ophthalmology

## 2012-02-26 DIAGNOSIS — H35039 Hypertensive retinopathy, unspecified eye: Secondary | ICD-10-CM

## 2012-02-26 DIAGNOSIS — H3530 Unspecified macular degeneration: Secondary | ICD-10-CM

## 2012-02-26 DIAGNOSIS — I10 Essential (primary) hypertension: Secondary | ICD-10-CM

## 2012-02-26 DIAGNOSIS — H43819 Vitreous degeneration, unspecified eye: Secondary | ICD-10-CM

## 2012-02-26 DIAGNOSIS — H353290 Exudative age-related macular degeneration, unspecified eye, stage unspecified: Secondary | ICD-10-CM

## 2012-04-15 ENCOUNTER — Encounter (INDEPENDENT_AMBULATORY_CARE_PROVIDER_SITE_OTHER): Payer: Medicare Other | Admitting: Ophthalmology

## 2012-04-15 DIAGNOSIS — H43819 Vitreous degeneration, unspecified eye: Secondary | ICD-10-CM

## 2012-04-15 DIAGNOSIS — H35039 Hypertensive retinopathy, unspecified eye: Secondary | ICD-10-CM

## 2012-04-15 DIAGNOSIS — H353290 Exudative age-related macular degeneration, unspecified eye, stage unspecified: Secondary | ICD-10-CM

## 2012-04-15 DIAGNOSIS — I10 Essential (primary) hypertension: Secondary | ICD-10-CM

## 2012-04-15 DIAGNOSIS — H3530 Unspecified macular degeneration: Secondary | ICD-10-CM

## 2012-04-22 ENCOUNTER — Other Ambulatory Visit: Payer: Medicare Other

## 2012-04-22 ENCOUNTER — Other Ambulatory Visit (INDEPENDENT_AMBULATORY_CARE_PROVIDER_SITE_OTHER): Payer: Medicare Other

## 2012-04-22 DIAGNOSIS — I119 Hypertensive heart disease without heart failure: Secondary | ICD-10-CM

## 2012-04-22 DIAGNOSIS — E7800 Pure hypercholesterolemia, unspecified: Secondary | ICD-10-CM

## 2012-04-22 LAB — HEPATIC FUNCTION PANEL
Alkaline Phosphatase: 70 U/L (ref 39–117)
Bilirubin, Direct: 0.1 mg/dL (ref 0.0–0.3)
Total Protein: 7.4 g/dL (ref 6.0–8.3)

## 2012-04-22 LAB — BASIC METABOLIC PANEL
BUN: 18 mg/dL (ref 6–23)
CO2: 28 mEq/L (ref 19–32)
Chloride: 109 mEq/L (ref 96–112)
Creatinine, Ser: 1.5 mg/dL (ref 0.4–1.5)

## 2012-04-22 LAB — LIPID PANEL
Cholesterol: 130 mg/dL (ref 0–200)
LDL Cholesterol: 83 mg/dL (ref 0–99)

## 2012-04-22 NOTE — Progress Notes (Signed)
Quick Note:  Please make copy of labs for patient visit. ______ 

## 2012-04-23 ENCOUNTER — Other Ambulatory Visit: Payer: Medicare Other

## 2012-04-25 ENCOUNTER — Encounter: Payer: Self-pay | Admitting: Cardiology

## 2012-04-25 ENCOUNTER — Ambulatory Visit (INDEPENDENT_AMBULATORY_CARE_PROVIDER_SITE_OTHER): Payer: Medicare Other | Admitting: Cardiology

## 2012-04-25 VITALS — BP 130/80 | HR 88 | Resp 18 | Ht 66.0 in | Wt 173.8 lb

## 2012-04-25 DIAGNOSIS — I119 Hypertensive heart disease without heart failure: Secondary | ICD-10-CM | POA: Diagnosis not present

## 2012-04-25 DIAGNOSIS — D3A090 Benign carcinoid tumor of the bronchus and lung: Secondary | ICD-10-CM

## 2012-04-25 DIAGNOSIS — E7800 Pure hypercholesterolemia, unspecified: Secondary | ICD-10-CM | POA: Diagnosis not present

## 2012-04-25 NOTE — Assessment & Plan Note (Signed)
The patient is currently on simvastatin 80 mg daily which he has been on for a long time.  We are checking lab work.  He is not having any myalgias or side effects from the simvastatin

## 2012-04-25 NOTE — Assessment & Plan Note (Signed)
The patient has not been expressing any symptoms to suggest congestive heart failure.  He's not having any dizziness or syncope.

## 2012-04-25 NOTE — Progress Notes (Signed)
Michael Bradley Date of Birth:  22-Jun-1925 [facility] [address] [place], [place]  [address] [phone]         Fax   [phone]  History of Present Illness: This pleasant 79 year old veteran who served in the [place] [employer] is seen for a scheduled followup office visit. 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 He has a history of mild dementia.  He has a past history of hypercholesterolemia and a history of hypothyroidism.  Since his last visit he has had no new medical problems or complaints   Current Outpatient Prescriptions  Medication Sig Dispense Refill  . aspirin 81 MG tablet Take 81 mg by mouth daily.        . cyclobenzaprine (FLEXERIL) 10 MG tablet Take 10 mg by mouth 2 (two) times daily as needed.        . galantamine (RAZADYNE) 8 MG tablet Take 8 mg by mouth 2 (two) times daily.       . hydrochlorothiazide 25 MG tablet Take 12.5 mg by mouth daily.        Marland Kitchen levothyroxine (LEVOTHROID) 25 MCG tablet 1 tablet daily except 2 tablets on wed and sun  50 tablet  11  . lisinopril (PRINIVIL,ZESTRIL) 20 MG tablet Take 10 mg by mouth daily.        Marland Kitchen LORazepam (ATIVAN) 0.5 MG tablet Take 0.5 mg by mouth every 8 (eight) hours.        . meclizine (ANTIVERT) 25 MG tablet Take 1 tablet (25 mg total) by mouth daily. As needed  30 tablet  3  . nitroGLYCERIN (NITROSTAT) 0.4 MG SL tablet Place 0.4 mg under the tongue every 5 (five) minutes as needed.        Marland Kitchen omeprazole (PRILOSEC) 20 MG capsule Take 20 mg by mouth at bedtime.       . simvastatin (ZOCOR) 80 MG tablet Take 80 mg by mouth at bedtime.        Marland Kitchen terazosin (HYTRIN) 2 MG capsule Take 2 mg by mouth at bedtime.          Allergies  Allergen Reactions  . Lisinopril Cough  . Quinolones     Patient Active Problem List  Diagnosis  . Dementia  . Hypercholesterolemia  . Benign  hypertensive heart disease without heart failure  . Carcinoid tumor of lung  . Dyspnea on exertion  . Hypothyroid  . Vertigo    History  Smoking status  . Former Smoker  . Quit date: 11/28/1980  Smokeless tobacco  . Not on file    History  Alcohol Use No    Family History  Problem Relation Age of Onset  . Lung cancer Father   . Lung cancer Brother     Review of Systems: Constitutional: no fever chills diaphoresis or fatigue or change in weight.  Head and neck: no hearing loss, no epistaxis, no photophobia or visual disturbance. Respiratory: No cough, shortness of breath or wheezing. Cardiovascular: No chest pain peripheral edema, palpitations. Gastrointestinal: No abdominal distention, no abdominal pain, no change in bowel habits hematochezia or melena. Genitourinary: No dysuria, no frequency, no urgency, no nocturia. Musculoskeletal:No arthralgias, no back pain, no gait disturbance or myalgias. Neurological: No dizziness, no headaches, no numbness, no seizures, no syncope, no weakness, no tremors. Hematologic: No lymphadenopathy, no easy bruising. Psychiatric: No confusion, no  hallucinations, no sleep disturbance.    Physical Exam: Filed Vitals:   04/25/12 1551  BP: 130/80  Pulse: 88  Resp: 18   the general appearance reveals a well-developed well-nourished alert gentleman in no distress.The head and neck exam reveals pupils equal and reactive.  Extraocular movements are full.  There is no scleral icterus.  The mouth and pharynx are normal.  The neck is supple.  The carotids reveal no bruits.  The jugular venous pressure is normal.  The  thyroid is not enlarged.  There is no lymphadenopathy.  The chest is clear to percussion and auscultation.  There are no rales or rhonchi.  Expansion of the chest is symmetrical.  There are decreased breath sounds at the right base because of his previous thoracotomy.The precordium is quiet.  The first heart sound is normal.  The second  heart sound is physiologically split.  There is no murmur gallop rub or click.  There is no abnormal lift or heave.  The abdomen is soft and nontender.  The bowel sounds are normal.  The liver and spleen are not enlarged.  There are no abdominal masses.  There are no abdominal bruits.  Extremities reveal good pedal pulses.  There is no phlebitis or edema.  There is no cyanosis or clubbing.  Strength is normal and symmetrical in all extremities.  There is no lateralizing weakness.  There are no sensory deficits.  The skin is warm and dry.  There is no rash.     Assessment / Plan: Continue same medication.  Lab work today is satisfactory including normal liver function studies and his LDL cholesterol is 83 and his renal function is normal.  Today we filled out papers for him to enter [employer]. Recheck in 6 months for followup office visit and fasting lab work

## 2012-04-25 NOTE — Patient Instructions (Addendum)
Your physician recommends that you continue on your current medications as directed. Please refer to the Current Medication list given to you today.  Your physician wants you to follow-up in: 6 months with fasting labs (lp/bmet/hfp)  You will receive a reminder letter in the mail two months in advance. If you don't receive a letter, please call our office to schedule the follow-up appointment.  

## 2012-04-25 NOTE — Assessment & Plan Note (Signed)
He has had a remote history of carcinoid tumor of the right lower lobe resulting in her colostomy and surgery in the 1970s.  He is not having any significant dyspnea or residual from the previous surgery.

## 2012-05-16 ENCOUNTER — Ambulatory Visit: Payer: Medicare Other | Admitting: Cardiology

## 2012-06-10 ENCOUNTER — Encounter (INDEPENDENT_AMBULATORY_CARE_PROVIDER_SITE_OTHER): Payer: Medicare Other | Admitting: Ophthalmology

## 2012-06-10 DIAGNOSIS — H353290 Exudative age-related macular degeneration, unspecified eye, stage unspecified: Secondary | ICD-10-CM

## 2012-06-10 DIAGNOSIS — H35039 Hypertensive retinopathy, unspecified eye: Secondary | ICD-10-CM

## 2012-06-10 DIAGNOSIS — H3530 Unspecified macular degeneration: Secondary | ICD-10-CM

## 2012-06-10 DIAGNOSIS — H43819 Vitreous degeneration, unspecified eye: Secondary | ICD-10-CM

## 2012-06-10 DIAGNOSIS — I10 Essential (primary) hypertension: Secondary | ICD-10-CM

## 2012-07-11 ENCOUNTER — Telehealth: Payer: Self-pay | Admitting: Cardiology

## 2012-07-11 DIAGNOSIS — F0390 Unspecified dementia without behavioral disturbance: Secondary | ICD-10-CM

## 2012-07-11 NOTE — Telephone Encounter (Signed)
Spoke with wife and she has tried multiple times to get an appointment since Dr Sandria Manly retired. Sent over another referral and advised if she didn't hear from them to call back

## 2012-07-11 NOTE — Telephone Encounter (Signed)
New Problem:    Patient's wife called in because Dr. Sandria Manly retired and they need a referral to a new neurologist.  Please call back.

## 2012-07-24 ENCOUNTER — Encounter: Payer: Self-pay | Admitting: Neurology

## 2012-07-24 ENCOUNTER — Ambulatory Visit (INDEPENDENT_AMBULATORY_CARE_PROVIDER_SITE_OTHER): Payer: Medicare Other | Admitting: Neurology

## 2012-07-24 ENCOUNTER — Telehealth: Payer: Self-pay | Admitting: Neurology

## 2012-07-24 VITALS — BP 140/82 | HR 58 | Temp 98.0°F | Ht 64.5 in | Wt 170.0 lb

## 2012-07-24 DIAGNOSIS — F329 Major depressive disorder, single episode, unspecified: Secondary | ICD-10-CM | POA: Diagnosis not present

## 2012-07-24 DIAGNOSIS — F3289 Other specified depressive episodes: Secondary | ICD-10-CM

## 2012-07-24 DIAGNOSIS — F0390 Unspecified dementia without behavioral disturbance: Secondary | ICD-10-CM

## 2012-07-24 MED ORDER — ESCITALOPRAM OXALATE 5 MG PO TABS: 5.0000 mg | ORAL_TABLET | Freq: Every day | ORAL | Status: DC

## 2012-07-24 NOTE — Patient Instructions (Addendum)
  I think overall you are doing fairly well but I do want to suggest a few things today:  Remember to drink plenty of fluid, eat healthy meals and do not skip any meals. Try to eat protein with a every meal and eat a healthy snack such as fruit or nuts in between meals. Try to keep a regular sleep-wake schedule and try to exercise daily, particularly in the form of walking, 20-30 minutes a day, if you can.   Engage in social activities in your community and with your family and try to keep up with current events by reading the newspaper or watching the news.   As far as your medications are concerned, I would like to suggest we may consider a second drug next time. For now I am starting you on a low dose antidepressant.    As far as diagnostic testing: no new test  I would like to see you back in 3 months, sooner if we need to. Please call us with any interim questions, concerns, problems, updates or refill requests.  Please also call us for any test results so we can go over those with you on the phone. Michael Bradley is my clinical assistant and will answer any of your questions and relay your messages to me and also relay most of my messages to you.  Our phone number is [phone]. We also have an after hours call service for urgent matters and there is a physician on-call for urgent questions. For any emergencies you know to call 911 or go to the nearest emergency room.

## 2012-07-24 NOTE — Progress Notes (Signed)
Subjective:    Patient ID: Michael Bradley is a 79 y.o. male.  HPI  Interim history:   Michael Bradley is a very pleasant 79 year old right-handed gentleman who presents for followup consultation of his dementia. The patient is accompanied by his wife today. This is his first visit with me in a previously followed with Dr. Fayrene Fearing love and was last seen by him on 01/14/2012, at which time Dr. love talked to him and his wife about his driving. He had concerned about his abilities to drive and he suggested followup for his macular degeneration. The patient's fall assessment full sore at the time is 13. 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 His current medications are simvastatin, nitroglycerin as needed, meclizine as needed, cyclobenzaprine as needed, Razadyne ER 24 mg once daily, hydrochlorothiazide 25 mg strength half a tablet daily, lorazepam 0.5 mg 3 times a day, lisinopril 20 mg half a tablet daily, low-dose aspirin, generic Synthroid, omeprazole, terazosin. I reviewed Dr. Imagene Gurney notes and the patient's records and below is a summary of that review:  43 year old right-handed gentleman with a 7 year history of memory loss, initially [facility] at the [facility] with Aricept and then change to Razadyne. There is no family history of memory loss. He has no history of head trauma drug or alcohol abuse but does drink alcohol on a daily basis. He has never had a stroke. In September 2012 his MMSE was 23, clock drawing was 2, and normal fluency was 10. His wife is under hospice care. He continues to drive. In January 2013 his MMSE was 22, clock drawing was 2, and normal fluency was 8. In September 2012 TSH was 5.26, RPR nonreactive, B12 334. MRI brain without contrast in September 2012 showed atrophy and chronic microvascular ischemia. He started having dizziness  in April 2013 and noted loss of hearing. He was diagnosed with positional vertigo. In May 2013 his MMSE was 23, clock drawing was 4, animal fluency was 10.   He reports getting a little worse with his memory. He gets agitated easily and frustrated because he cannot remember. He sleeps well. He is not frankly depressed, but there is some anxiety for which Lorazepam is helpful. No hallucinations or delusions. They moved to [place] [employer] in indep. Living.   His Past Medical History Is Significant For: Past Medical History  Diagnosis Date  . Hypertension   . Hyperlipidemia   . Chest pain, atypical   . Fatigue   . Blurred vision   . Palpitations   . Forgetfulness   . ED (erectile dysfunction)   . History of kidney stones   . 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    His Past Surgical History Is Significant For: Past Surgical History  Procedure Laterality Date  . Colonoscopy    . 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 Cardiovascular stress test  10/08/2002    EF 63%  . Lobectomy  1979  . Cataract extraction      os 2006-od 2010    His Family History Is Significant For: Family History  Problem Relation Age of Onset  . Lung cancer Father   . Emphysema Father   . Lung cancer Brother   . Leukemia Mother   . Heart attack Brother   .  Kidney cancer Brother     His Social History Is Significant For: History   Social History  . Marital Status: Married    Spouse Name: N/A    Number of Children: 0  . Years of Education: 12   Occupational History  .     Social History Main Topics  . Smoking status: Former Smoker    Quit date: 11/28/1980  . Smokeless tobacco: None  . Alcohol Use: No     Comment: 2 alcohol beverages nightly  . Drug Use: No  . Sexually Active: None   Other Topics Concern  . None   Social History Narrative   Pt lives at home with his spouse.   Caffeine Use: 2 cups of caffeine per day.      His Allergies Are:  Allergies  Allergen Reactions  . Lisinopril Cough  . Quinolones   :   His Current Medications Are:  Outpatient Encounter Prescriptions as of 07/24/2012  Medication Sig Dispense Refill  . aspirin 81 MG tablet Take 81 mg by mouth daily.        . cyclobenzaprine (FLEXERIL) 10 MG tablet Take 10 mg by mouth 2 (two) times daily as needed.        . galantamine (RAZADYNE) 8 MG tablet Take 8 mg by mouth 2 (two) times daily.       . hydrochlorothiazide 25 MG tablet Take 12.5 mg by mouth daily.        Marland Kitchen levothyroxine (LEVOTHROID) 25 MCG tablet 1 tablet daily except 2 tablets on wed and sun  50 tablet  11  . lisinopril (PRINIVIL,ZESTRIL) 20 MG tablet Take 10 mg by mouth daily.        Marland Kitchen LORazepam (ATIVAN) 0.5 MG tablet Take 0.5 mg by mouth every 8 (eight) hours.        . meclizine (ANTIVERT) 25 MG tablet Take 1 tablet (25 mg total) by mouth daily. As needed  30 tablet  3  . nitroGLYCERIN (NITROSTAT) 0.4 MG SL tablet Place 0.4 mg under the tongue every 5 (five) minutes as needed.        Marland Kitchen omeprazole (PRILOSEC) 20 MG capsule Take 20 mg by mouth at bedtime.       . simvastatin (ZOCOR) 80 MG tablet Take 80 mg by mouth at bedtime.        Marland Kitchen terazosin (HYTRIN) 2 MG capsule Take 2 mg by mouth at bedtime.        Marland Kitchen escitalopram (LEXAPRO) 5 MG tablet Take 1 tablet (5 mg total) by mouth daily.  30 tablet  5   No facility-administered encounter medications on file as of 07/24/2012.    Review of Systems  HENT: Positive for hearing loss.   Musculoskeletal:       Cramps  Neurological:       Memory loss, confusion    Objective:  Neurologic Exam  Physical Exam Physical Examination:   Filed Vitals:   07/24/12 1333  BP: 140/82  Pulse: 58  Temp: 98 F (36.7 C)    General Examination: The patient is a very pleasant 79 y.o. male in no acute distress. He is calm and cooperative with the exam. He denies Auditory Hallucinations and Visual Hallucinations.   HEENT: Normocephalic,  atraumatic, pupils are equal, round and reactive to light and accommodation. Funduscopic exam is normal with sharp disc margins noted. Extraocular tracking shows mild saccadic breakdown without nystagmus noted. Hearing is impaired. Tympanic membranes are clear bilaterally. Face is symmetric with no facial  masking and normal facial sensation. There is no lip, neck or jaw tremor. Neck is not rigid with intact passive ROM. There are no carotid bruits on auscultation. Oropharynx exam reveals mild mouth dryness. No significant airway crowding is noted. Mallampati is class II. Tongue protrudes centrally and palate elevates symmetrically.    Chest: is clear to auscultation without wheezing, rhonchi or crackles noted.  Heart: sounds are regular and normal without murmurs, rubs or gallops noted.   Abdomen: is soft, non-tender and non-distended with normal bowel sounds appreciated on auscultation.  Extremities: There is no pitting edema in the distal lower extremities bilaterally. Pedal pulses are intact.  Skin: is warm and dry with no trophic changes noted.  Musculoskeletal: exam reveals no obvious joint deformities, tenderness or joint swelling or erythema.  Neurologically:  Mental status: The patient is awake and alert, paying fair  attention. He is able to to partially provide the history. His wife provides details. He is oriented to: person, place, situation and day of week. His memory, attention, language and knowledge are impaired. There is no aphasia, agnosia, apraxia or anomia. There is a mild degree of bradyphrenia. Speech is mildly hypophonic with no dysarthria noted. Mood is congruent and affect is blunted and constricted.  His MMSE score is 19/30. CDT is 2/4. AFT (Animal Fluency Test) score is 7.  Cranial nerves are as described above under HEENT exam. In addition, shoulder shrug is normal with equal shoulder height noted.  Motor exam: Normal bulk, and strength for age is noted. Tone is not  rigid with absence of cogwheeling in the extremities. There is overall no significant bradykinesia. There is no drift or rebound. There is no tremor. Romberg is negative. Reflexes are 1+ in the upper extremities and 1+ in the lower extremities. Toes are downgoing bilaterally. Fine motor skills: Finger taps, hand movements, and rapid alternating patting are not significantly impaired bilaterally. Foot taps and foot agility are mildly impaired bilaterally.   Cerebellar testing shows no dysmetria or intention tremor on finger to nose testing. There is no truncal or gait ataxia.   Sensory exam is intact to light touch, pinprick, vibration, temperature sense and proprioception in the upper and lower extremities.   Gait, station and balance: He stands up from the seated position with mild difficulty and needs to push himself up. No veering to one side is noted. No leaning to one side. Posture is mildly stooped, but age appropriate. Stance is wide-based. He turns in 3 steps. Tandem walk is not possible. Balance is mildly impaired.     Assessment and Plan:    In summary, Michael Bradley is a very pleasant 79 y.o.-year old male with a history of advanced dementia. He has a MMSE of 19/30 today and has had more mood related issues. His wife tells me he no longer drives. He is hard of hearing and had that checked and his wife says they told him a hearing aid will not help him. I will keep his Galantamine the same but will add low dose Lexapro. At his next visit I will consider adding Namenda.I had a long chat with the patient and his wife about my findings and the diagnosis of dementia, the prognosis and treatment options. We talked about medical treatments and non-pharmacological approaches. We talked about maintaining a healthy lifestyle in general. I encouraged the patient to eat healthy, exercise daily and keep well hydrated, to keep a scheduled bedtime and wake time routine, to not skip any meals and  eat  healthy snacks in between meals and to have protein with every meal.  As far as medications are concerned, I recommended the following at this time: Continue with galantamine and add Lexapro 5 mg strength once daily.  I answered all their questions today and the patient and his wife were in agreement with the above outlined plan. I would like to see the patient back in 3 months, sooner if the need arises and encouraged them to call with any interim questions, concerns, problems or updates and refill requests.

## 2012-09-02 ENCOUNTER — Encounter (INDEPENDENT_AMBULATORY_CARE_PROVIDER_SITE_OTHER): Payer: Medicare Other | Admitting: Ophthalmology

## 2012-09-02 DIAGNOSIS — H3530 Unspecified macular degeneration: Secondary | ICD-10-CM

## 2012-09-02 DIAGNOSIS — H43819 Vitreous degeneration, unspecified eye: Secondary | ICD-10-CM

## 2012-09-02 DIAGNOSIS — H35039 Hypertensive retinopathy, unspecified eye: Secondary | ICD-10-CM

## 2012-09-02 DIAGNOSIS — H353290 Exudative age-related macular degeneration, unspecified eye, stage unspecified: Secondary | ICD-10-CM

## 2012-09-02 DIAGNOSIS — I10 Essential (primary) hypertension: Secondary | ICD-10-CM

## 2012-09-24 ENCOUNTER — Telehealth: Payer: Self-pay | Admitting: Cardiology

## 2012-09-24 DIAGNOSIS — E039 Hypothyroidism, unspecified: Secondary | ICD-10-CM

## 2012-09-24 MED ORDER — LEVOTHYROXINE SODIUM 25 MCG PO TABS: ORAL_TABLET | ORAL | Status: DC

## 2012-09-24 NOTE — Telephone Encounter (Signed)
°

## 2012-09-24 NOTE — Telephone Encounter (Signed)
Refilled thyroid medication as requested  

## 2012-10-03 ENCOUNTER — Telehealth: Payer: Self-pay | Admitting: Neurology

## 2012-10-06 NOTE — Telephone Encounter (Signed)
It was my understanding that the patient has not been driving and we talked about it during our visit. Pls find out what the question is. What is Silver Alerts?

## 2012-10-06 NOTE — Telephone Encounter (Signed)
Returned call. No answer. Left vmail. Will fwd messge. To Dr. Frances Furbish.

## 2012-10-07 NOTE — Telephone Encounter (Signed)
Spoke to [employer] (niece). Advised to contact PCP ([facility]), DMV, or local Police Station who advised family would report Silver Alerts to [facility]. Niece agreed.

## 2012-10-21 ENCOUNTER — Ambulatory Visit (INDEPENDENT_AMBULATORY_CARE_PROVIDER_SITE_OTHER): Payer: Medicare Other | Admitting: Cardiology

## 2012-10-21 ENCOUNTER — Other Ambulatory Visit: Payer: Medicare Other

## 2012-10-21 ENCOUNTER — Encounter: Payer: Self-pay | Admitting: Cardiology

## 2012-10-21 VITALS — BP 122/68 | HR 60 | Ht 65.0 in | Wt 169.1 lb

## 2012-10-21 DIAGNOSIS — F0390 Unspecified dementia without behavioral disturbance: Secondary | ICD-10-CM | POA: Diagnosis not present

## 2012-10-21 DIAGNOSIS — I119 Hypertensive heart disease without heart failure: Secondary | ICD-10-CM | POA: Diagnosis not present

## 2012-10-21 DIAGNOSIS — E7800 Pure hypercholesterolemia, unspecified: Secondary | ICD-10-CM

## 2012-10-21 DIAGNOSIS — E039 Hypothyroidism, unspecified: Secondary | ICD-10-CM

## 2012-10-21 LAB — T4, FREE: Free T4: 0.84 ng/dL (ref 0.60–1.60)

## 2012-10-21 LAB — TSH: TSH: 3.23 u[IU]/mL (ref 0.35–5.50)

## 2012-10-21 NOTE — Progress Notes (Signed)
Caren Griffins Date of Birth:  1926-03-02 [facility] [address] [place], [place]  [address] [phone]         Fax   [phone]  History of Present Illness: This pleasant 79 year old veteran who served in the [place] [employer] is seen for a scheduled followup office visit. 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 He has a history of mild dementia. He has a past history of hypercholesterolemia and a history of hypothyroidism. Since his last visit he has had no new medical problems or complaints   Current Outpatient Prescriptions  Medication Sig Dispense Refill  . aspirin 81 MG tablet Take 81 mg by mouth daily.        Marland Kitchen atorvastatin (LIPITOR) 80 MG tablet Take 40 mg by mouth daily.      . chlorpheniramine (CHLOR-TRIMETON) 4 MG tablet Take 4 mg by mouth 2 (two) times daily as needed for allergies.      . cyclobenzaprine (FLEXERIL) 10 MG tablet Take 10 mg by mouth 2 (two) times daily as needed.        Marland Kitchen escitalopram (LEXAPRO) 5 MG tablet Take 1 tablet (5 mg total) by mouth daily.  30 tablet  5  . galantamine (RAZADYNE ER) 24 MG 24 hr capsule Take 24 mg by mouth daily with breakfast.      . hydrochlorothiazide 25 MG tablet Take 12.5 mg by mouth daily.        Marland Kitchen levothyroxine (LEVOTHROID) 25 MCG tablet 1 tablet daily except 2 tablets on wed and sun  50 tablet  11  . lisinopril (PRINIVIL,ZESTRIL) 20 MG tablet Take 10 mg by mouth daily.        Marland Kitchen LORazepam (ATIVAN) 0.5 MG tablet Take 0.5 mg by mouth every 8 (eight) hours.        . nitroGLYCERIN (NITROSTAT) 0.4 MG SL tablet Place 0.4 mg under the tongue every 5 (five) minutes as needed.        Marland Kitchen omeprazole (PRILOSEC) 20 MG capsule Take 20 mg by mouth at bedtime.       Marland Kitchen terazosin (HYTRIN) 2 MG capsule Take 2 mg by mouth at bedtime.         No current facility-administered medications for this  visit.    Allergies  Allergen Reactions  . Lisinopril Cough  . Quinolones     Patient Active Problem List   Diagnosis Date Noted  . Hypothyroid 08/16/2011  . Vertigo 08/16/2011  . Dementia 12/07/2010  . Hypercholesterolemia 12/07/2010  . Benign hypertensive heart disease without heart failure 12/07/2010  . 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 Dyspnea on exertion 12/07/2010    History  Smoking status  . Former Smoker  . Quit date: 11/28/1980  Smokeless tobacco  . Not on file    History  Alcohol Use No    Comment: 2 alcohol beverages nightly    Family History  Problem Relation Age of Onset  . Lung cancer Father   . Emphysema Father   . Lung cancer Brother   . Leukemia Mother   . Heart attack Brother   . Kidney cancer Brother     Review of Systems: Constitutional: no fever chills diaphoresis or fatigue or change in weight.  Head and neck: no hearing loss, no epistaxis, no photophobia or visual disturbance. Respiratory: No cough, shortness of breath or  wheezing. Cardiovascular: No chest pain peripheral edema, palpitations. Gastrointestinal: No abdominal distention, no abdominal pain, no change in bowel habits hematochezia or melena. Genitourinary: No dysuria, no frequency, no urgency, no nocturia. Musculoskeletal:No arthralgias, no back pain, no gait disturbance or myalgias. Neurological: No dizziness, no headaches, no numbness, no seizures, no syncope, no weakness, no tremors. Hematologic: No lymphadenopathy, no easy bruising. Psychiatric: No confusion, no hallucinations, no sleep disturbance.    Physical Exam: Filed Vitals:   10/21/12 1050  BP: 122/68  Pulse: 60   the general appearance reveals a well-developed well-nourished elderly gentleman in no distress.The head and neck exam reveals pupils equal and reactive.  Extraocular movements are full.  There is no scleral icterus.  The mouth and pharynx are normal.  The neck is supple.  The carotids reveal no  bruits.  The jugular venous pressure is normal.  The  thyroid is not enlarged.  There is no lymphadenopathy.  The chest is clear to percussion and auscultation.  There are no rales or rhonchi.  Expansion of the chest is symmetrical.  There are decreased breath sounds at the right base chronically .The precordium is quiet.  The first heart sound is normal.  The second heart sound is physiologically split.  There is no murmur gallop rub or click.  There is no abnormal lift or heave.  The abdomen is soft and nontender.  The bowel sounds are normal.  The liver and spleen are not enlarged.  There are no abdominal masses.  There are no abdominal bruits.  Extremities reveal good pedal pulses.  There is no phlebitis or edema.  There is no cyanosis or clubbing.  Strength is normal and symmetrical in all extremities.  There is no lateralizing weakness.  There are no sensory deficits.  The skin is warm and dry.  There is no rash.     Assessment / Plan: Continue on same medication.  Blood work today pending.  Recheck in 4 months for office visit lipid panel hepatic function panel and basal metabolic panel

## 2012-10-21 NOTE — Assessment & Plan Note (Signed)
The patient has a history of hypercholesterolemia.  He is on Lipitor 80 mg daily.  He is not having any myalgias.  We will check fasting lab work today.

## 2012-10-21 NOTE — Patient Instructions (Addendum)
Your physician recommends that you schedule a follow-up appointment in: 4 months   Your physician recommends that you return for lab work in: today   Your physician recommends that you continue on your current medications as directed. Please refer to the Current Medication list given to you today.

## 2012-10-21 NOTE — Assessment & Plan Note (Signed)
Blood pressure is remaining stable on current therapy.  No dizziness or syncope.  No palpitations. 

## 2012-10-21 NOTE — Assessment & Plan Note (Signed)
The patient's wife thinks that his dementia has improved since he was placed on his new galantamine medication by his neurologist.

## 2012-10-22 LAB — HEPATIC FUNCTION PANEL
AST: 28 U/L (ref 0–37)
Albumin: 4.3 g/dL (ref 3.5–5.2)
Alkaline Phosphatase: 69 U/L (ref 39–117)
Bilirubin, Direct: 0.1 mg/dL (ref 0.0–0.3)
Total Bilirubin: 0.9 mg/dL (ref 0.3–1.2)

## 2012-10-22 LAB — BASIC METABOLIC PANEL
CO2: 24 mEq/L (ref 19–32)
Chloride: 106 mEq/L (ref 96–112)
Creatinine, Ser: 1.5 mg/dL (ref 0.4–1.5)
Glucose, Bld: 90 mg/dL (ref 70–99)

## 2012-10-22 LAB — LIPID PANEL
LDL Cholesterol: 102 mg/dL — ABNORMAL HIGH (ref 0–99)
Total CHOL/HDL Ratio: 5
Triglycerides: 132 mg/dL (ref 0.0–149.0)

## 2012-10-22 NOTE — Progress Notes (Signed)
Quick Note:  Please report to patient. The recent labs are stable. Continue same medication and careful diet. Thyroid function is normal. ______ 

## 2012-10-27 ENCOUNTER — Telehealth: Payer: Self-pay | Admitting: Cardiology

## 2012-10-27 NOTE — Telephone Encounter (Signed)
New problem   Michael Bradley is needing  a prescription for pt to continue taking Ativan. Please call [facility]

## 2012-10-27 NOTE — Telephone Encounter (Signed)
Michael Bradley gone for the day, will call back tomorrow

## 2012-10-28 ENCOUNTER — Encounter: Payer: Self-pay | Admitting: Neurology

## 2012-10-28 ENCOUNTER — Ambulatory Visit (INDEPENDENT_AMBULATORY_CARE_PROVIDER_SITE_OTHER): Payer: Medicare Other | Admitting: Neurology

## 2012-10-28 ENCOUNTER — Ambulatory Visit: Payer: Medicare Other | Admitting: Neurology

## 2012-10-28 VITALS — BP 128/78 | HR 65 | Temp 97.2°F | Ht 64.5 in | Wt 171.0 lb

## 2012-10-28 DIAGNOSIS — F329 Major depressive disorder, single episode, unspecified: Secondary | ICD-10-CM | POA: Diagnosis not present

## 2012-10-28 DIAGNOSIS — F0390 Unspecified dementia without behavioral disturbance: Secondary | ICD-10-CM | POA: Diagnosis not present

## 2012-10-28 DIAGNOSIS — F411 Generalized anxiety disorder: Secondary | ICD-10-CM | POA: Diagnosis not present

## 2012-10-28 DIAGNOSIS — F3289 Other specified depressive episodes: Secondary | ICD-10-CM

## 2012-10-28 DIAGNOSIS — F419 Anxiety disorder, unspecified: Secondary | ICD-10-CM

## 2012-10-28 MED ORDER — ESCITALOPRAM OXALATE 5 MG PO TABS: 5.0000 mg | ORAL_TABLET | Freq: Every day | ORAL | Status: DC

## 2012-10-28 NOTE — Telephone Encounter (Signed)
Will fax to [facility] [phone]

## 2012-10-28 NOTE — Patient Instructions (Addendum)
I think overall you are doing fairly well and are stable at this point.   I do have some generic suggestions for you today:  Please make sure that you drink plenty of fluids. I would like for you to exercise daily for example in the form of walking 20-30 minutes every day, if you can. Please keep a regular sleep-wake schedule, keep regular meal times, do not skip any meals, eat  healthy snacks in between meals, such as fruit or nuts. Try to eat protein with every meal.   As far as your medications are concerned, I would like to suggest: continue galantamine, and start Lexapro.  As far as diagnostic testing, I recommend: no new test at this time.  Engage in social activities in your community and with your family and try to keep up with current events by reading the newspaper or watching the news.  Brett Canales is my clinical assistant and will answer any of your questions and relay your messages to me and will give you my messages.   Our phone number is [phone]. We also have an after hours call service for urgent matters and there is a physician on-call for urgent questions. For any emergencies you know to call 911 or go to the nearest emergency room.

## 2012-10-28 NOTE — Progress Notes (Signed)
Subjective:    Patient ID: Michael Bradley is a 79 y.o. male.  HPI  Interim history:   Mr. Michael Bradley is a very pleasant 79 year old right-handed gentleman who presents for followup consultation of his dementia. The patient is accompanied by his wife again today. I first met him on 07/24/2012, at which time his MMSE was 19, CDT was 2/4 and AFT was 7. He had more mood related issues recently. He no longer drives. I added low-dose Lexapro and continued his galantamine. I suggested adding Namenda at his next visit with me. He previously followed with Dr. Fayrene Fearing love and was last seen by him on 01/14/2012, at which time Dr. love talked to him and his wife about his driving, esp. in light of his macular degeneration. The patient's fall assessment full sore at the time is 13. 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 His current medications are simvastatin, nitroglycerin as needed, meclizine as needed, cyclobenzaprine as needed, Razadyne ER 24 mg once daily, hydrochlorothiazide 25 mg strength half a tablet daily, lorazepam 0.5 mg 3 times a day, lisinopril 20 mg half a tablet daily, low-dose aspirin, generic Synthroid, omeprazole, terazosin. He has a 7+ year history of memory loss, initially treated at the [facility] with Aricept and then changed to Razadyne. There is no family history of memory loss. He has no history of head trauma drug or alcohol abuse but does drink alcohol on a daily basis. He has never had a stroke. In September 2012 his MMSE was 23, clock drawing was 2, and animal fluency was 10. In January 2013 his MMSE was 22, clock drawing was 2, and AFT was 8. In September 2012 TSH was 5.26, RPR nonreactive, B12 334. MRI brain without contrast in September 2012 showed atrophy and chronic microvascular ischemia. He started having dizziness in April 2013 and  noted loss of hearing. He was diagnosed with positional vertigo. In May 2013 his MMSE was 23, clock drawing was 4, animal fluency was 10.   He reports being a little worse with his memory. His wife is still concerned about him staying "angry all the time". They reside at [facility] in the [facility]isted [facility] and they get their medicine dispensed. He is not sure if he has been given the new medicine I prescribed last time, namely Lexapro. I was able to review the [facility] and the lexapro 5 mg is not on the list.  His Past Medical History Is Significant For: Past Medical History  Diagnosis Date  . Hypertension   . Hyperlipidemia   . Chest pain, atypical   . Fatigue   . Blurred vision   . Palpitations   . Forgetfulness   . ED (erectile dysfunction)   . History of kidney stones   . 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    His Past Surgical History Is Significant For: Past Surgical History  Procedure Laterality Date  . Colonoscopy    . 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 Cardiovascular stress test  10/08/2002    EF 63%  . Lobectomy  1979  . Cataract extraction      os 2006-od 2010    His Family History Is Significant For: Family History  Problem Relation Age of Onset  . Lung cancer Father   .  Emphysema Father   . Lung cancer Brother   . Leukemia Mother   . Heart attack Brother   . Kidney cancer Brother     His Social History Is Significant For: History   Social History  . Marital Status: Married    Spouse Name: N/A    Number of Children: 0  . Years of Education: 12   Occupational History  .     Social History Main Topics  . Smoking status: Former Smoker    Quit date: 11/28/1980  . Smokeless tobacco: None  . Alcohol Use: No     Comment: 2 alcohol beverages nightly  . Drug Use: No  . Sexually Active: None   Other Topics Concern  . None   Social History Narrative   Pt lives at home with his spouse.    Caffeine Use: 2 cups of caffeine per day.     His Allergies Are:  Allergies  Allergen Reactions  . Lisinopril Cough  . Quinolones   :   His Current Medications Are:  Outpatient Encounter Prescriptions as of 10/28/2012  Medication Sig Dispense Refill  . aspirin 81 MG tablet Take 81 mg by mouth daily.        Marland Kitchen atorvastatin (LIPITOR) 80 MG tablet Take 40 mg by mouth daily.      . chlorpheniramine (CHLOR-TRIMETON) 4 MG tablet Take 4 mg by mouth 2 (two) times daily as needed for allergies.      . cyclobenzaprine (FLEXERIL) 10 MG tablet Take 10 mg by mouth 2 (two) times daily as needed.        Marland Kitchen escitalopram (LEXAPRO) 5 MG tablet Take 1 tablet (5 mg total) by mouth daily.  30 tablet  5  . galantamine (RAZADYNE ER) 24 MG 24 hr capsule Take 24 mg by mouth daily with breakfast.      . hydrochlorothiazide 25 MG tablet Take 12.5 mg by mouth daily.        Marland Kitchen levothyroxine (LEVOTHROID) 25 MCG tablet 1 tablet daily except 2 tablets on wed and sun  50 tablet  11  . lisinopril (PRINIVIL,ZESTRIL) 20 MG tablet Take 10 mg by mouth daily.        Marland Kitchen LORazepam (ATIVAN) 0.5 MG tablet Take 0.5 mg by mouth every 8 (eight) hours.        . nitroGLYCERIN (NITROSTAT) 0.4 MG SL tablet Place 0.4 mg under the tongue every 5 (five) minutes as needed.        Marland Kitchen omeprazole (PRILOSEC) 20 MG capsule Take 20 mg by mouth at bedtime.       Marland Kitchen terazosin (HYTRIN) 2 MG capsule Take 2 mg by mouth at bedtime.         No facility-administered encounter medications on file as of 10/28/2012.    Review of Systems  HENT: Positive for hearing loss.   Eyes: Positive for visual disturbance (macular degeneration).  Respiratory: Positive for cough.   Allergic/Immunologic: Positive for environmental allergies.  Neurological:       Memory loss  Psychiatric/Behavioral: Positive for confusion, sleep disturbance (insomnia) and dysphoric mood. The patient is nervous/anxious.     Objective:  Neurologic Exam  Physical Exam Physical  Examination:   Filed Vitals:   10/28/12 1201  BP: 128/78  Pulse: 65  Temp: 97.2 F (36.2 C)    General Examination: The patient is a very pleasant 79 y.o. male in no acute distress. He is calm and cooperative with the exam. He denies Auditory Hallucinations and Visual Hallucinations.  HEENT: Normocephalic, atraumatic, pupils are equal, round and reactive to light and accommodation. Extraocular tracking shows mild saccadic breakdown without nystagmus noted. Hearing is impaired. Face is symmetric with no facial masking and normal facial sensation. There is no lip, neck or jaw tremor. Neck is not rigid with intact passive ROM. There are no carotid bruits on auscultation. Oropharynx exam reveals mild mouth dryness. No significant airway crowding is noted. Mallampati is class II. Tongue protrudes centrally and palate elevates symmetrically.    Chest: is clear to auscultation without wheezing, rhonchi or crackles noted.  Heart: sounds are regular and normal without murmurs, rubs or gallops noted.   Abdomen: is soft, non-tender and non-distended with normal bowel sounds appreciated on auscultation.  Extremities: There is no pitting edema in the distal lower extremities bilaterally. Pedal pulses are intact.  Skin: is warm and dry with no trophic changes noted.  Musculoskeletal: exam reveals no obvious joint deformities, tenderness or joint swelling or erythema.  Neurologically:  Mental status: The patient is awake and alert, paying fair  attention. He is able to to partially provide the history. His wife provides details. He is oriented to: person, not place, but aware of the situation and day of week. His memory, attention, language and knowledge are moderately impaired. There is no aphasia, agnosia, apraxia or anomia. There is a mild degree of bradyphrenia. Speech is mildly hypophonic with no dysarthria noted. Mood is congruent and affect is blunted and constricted.  His last MMSE score from  07/19/12: 19/30, CDT is 2/4, AFT (Animal Fluency Test) score is 7.  Cranial nerves are as described above under HEENT exam. In addition, shoulder shrug is normal with equal shoulder height noted.  Motor exam: Normal bulk, and strength for age is noted. Tone is not rigid with absence of cogwheeling in the extremities. There is overall no significant bradykinesia. There is no drift or rebound. There is no tremor. Romberg is negative. Reflexes are 1+ in the upper extremities and 1+ in the lower extremities. Toes are downgoing bilaterally. Fine motor skills: Finger taps, hand movements, and rapid alternating patting are not significantly impaired bilaterally. Foot taps and foot agility are mildly impaired bilaterally.   Cerebellar testing shows no dysmetria or intention tremor on finger to nose testing. There is no truncal or gait ataxia.   Sensory exam is intact to light touch.   Gait, station and balance: He stands up from the seated position with mild difficulty and needs to push himself up. No veering to one side is noted. No leaning to one side. Posture is mildly stooped, but age appropriate. Stance is wide-based. He turns in 3 steps. Tandem walk is not possible. Balance is mildly impaired.    Assessment and Plan:    In summary, VRAJ DENARDO is a very pleasant 79 y.o.-year old male with a history of advanced dementia. He had a MMSE of 19/30 3 months ago and seems stable in that regard, but has not yet initiated Lexapro for his mood related issues. He no longer drives. He is hard of hearing. I will keep his Galantamine the same but will again try to add low dose Lexapro. At his next visit I will consider adding Namenda. I do not want to make too many changes at once. We talked about medical treatments and non-pharmacological approaches. We talked about maintaining a healthy lifestyle in general. I encouraged the patient to eat healthy, exercise daily and keep well hydrated, to keep a scheduled  bedtime and wake  time routine, to not skip any meals and eat healthy snacks in between meals and to have protein with every meal.  As far as medications are concerned, I recommended the following at this time: Continue with galantamine and add Lexapro 5 mg strength once daily. Consider Namenda at next visit.  I answered all their questions today and the patient and his wife were in agreement with the above outlined plan. I would like to see the patient back in 4 months, sooner if the need arises and encouraged them to call with any interim questions, concerns, problems or updates and refill requests.

## 2012-10-30 ENCOUNTER — Telehealth: Payer: Self-pay | Admitting: Neurology

## 2012-10-30 NOTE — Telephone Encounter (Signed)
Pt needs Dr. Teofilo Pod nurse call pt's wife back concerning some medication that pt is taking. Please call pt concerning this matter. Thanks

## 2012-10-31 ENCOUNTER — Telehealth: Payer: Self-pay | Admitting: *Deleted

## 2012-10-31 ENCOUNTER — Telehealth: Payer: Self-pay | Admitting: Neurology

## 2012-10-31 NOTE — Telephone Encounter (Signed)
Message copied by Burnell Blanks on Fri Oct 31, 2012  5:33 PM ------      Message from: Cassell Clement      Created: Wed Oct 22, 2012 12:34 PM       Please report to patient.  The recent labs are stable. Continue same medication and careful diet.  Thyroid function is normal. ------

## 2012-10-31 NOTE — Telephone Encounter (Signed)
Mailed copy of labs and left message to call if any questions  

## 2012-10-31 NOTE — Telephone Encounter (Signed)
Called patient his wife answered she stated her husband has stopped taking his Ativan 0.5 MG every 8 hours.  And started his Lexapro she states he says he feel better with this medication and wants to know if he should take both or D/C Lexapro.       

## 2012-11-03 NOTE — Telephone Encounter (Signed)
Called patient his wife answered she stated her husband has stopped taking his Ativan 0.5 MG every 8 hours.  And started his Lexapro she states he says he feel better with this medication and wants to know if he should take both or D/C Lexapro.

## 2012-11-03 NOTE — Telephone Encounter (Signed)
Please advise patient or his wife to continue to take the Lexapro which is the new medicine and continue to stay off the Ativan which she stopped altogether already. Both together could be too sedating. The Lexapro may take a while to kick in however. He can take several weeks to feel better when taking a new antidepressant. As long as he does not have any side effects he should continue that daily.

## 2012-11-04 NOTE — Telephone Encounter (Signed)
Called patient wife and and let her know to keep her husband off the Ativan. She states he has not been on it every since he started the last one. And lexapro is working for him and her.

## 2012-12-09 ENCOUNTER — Encounter (INDEPENDENT_AMBULATORY_CARE_PROVIDER_SITE_OTHER): Payer: Medicare Other | Admitting: Ophthalmology

## 2012-12-09 DIAGNOSIS — H353290 Exudative age-related macular degeneration, unspecified eye, stage unspecified: Secondary | ICD-10-CM | POA: Diagnosis not present

## 2012-12-09 DIAGNOSIS — H43819 Vitreous degeneration, unspecified eye: Secondary | ICD-10-CM

## 2012-12-09 DIAGNOSIS — H3530 Unspecified macular degeneration: Secondary | ICD-10-CM | POA: Diagnosis not present

## 2012-12-09 DIAGNOSIS — H35039 Hypertensive retinopathy, unspecified eye: Secondary | ICD-10-CM

## 2012-12-09 DIAGNOSIS — I10 Essential (primary) hypertension: Secondary | ICD-10-CM

## 2012-12-31 DIAGNOSIS — Z23 Encounter for immunization: Secondary | ICD-10-CM | POA: Diagnosis not present

## 2013-02-04 ENCOUNTER — Other Ambulatory Visit: Payer: Self-pay

## 2013-02-04 MED ORDER — TERAZOSIN HCL 2 MG PO CAPS: 2.0000 mg | ORAL_CAPSULE | Freq: Every day | ORAL | Status: DC

## 2013-02-23 ENCOUNTER — Ambulatory Visit (INDEPENDENT_AMBULATORY_CARE_PROVIDER_SITE_OTHER): Payer: Medicare Other | Admitting: Cardiology

## 2013-02-23 ENCOUNTER — Encounter: Payer: Self-pay | Admitting: Cardiology

## 2013-02-23 ENCOUNTER — Other Ambulatory Visit (INDEPENDENT_AMBULATORY_CARE_PROVIDER_SITE_OTHER): Payer: Medicare Other

## 2013-02-23 VITALS — BP 134/78 | HR 59 | Ht 64.5 in | Wt 173.1 lb

## 2013-02-23 DIAGNOSIS — I119 Hypertensive heart disease without heart failure: Secondary | ICD-10-CM

## 2013-02-23 DIAGNOSIS — E039 Hypothyroidism, unspecified: Secondary | ICD-10-CM

## 2013-02-23 DIAGNOSIS — F0390 Unspecified dementia without behavioral disturbance: Secondary | ICD-10-CM

## 2013-02-23 DIAGNOSIS — D1431 Benign neoplasm of right bronchus and lung: Secondary | ICD-10-CM

## 2013-02-23 DIAGNOSIS — R42 Dizziness and giddiness: Secondary | ICD-10-CM | POA: Diagnosis not present

## 2013-02-23 DIAGNOSIS — D3A090 Benign carcinoid tumor of the bronchus and lung: Secondary | ICD-10-CM

## 2013-02-23 DIAGNOSIS — E7800 Pure hypercholesterolemia, unspecified: Secondary | ICD-10-CM

## 2013-02-23 LAB — LIPID PANEL
Cholesterol: 145 mg/dL (ref 0–200)
HDL: 30.7 mg/dL — ABNORMAL LOW (ref >39.00)
Triglycerides: 219 mg/dL — ABNORMAL HIGH (ref 0.0–149.0)

## 2013-02-23 LAB — HEPATIC FUNCTION PANEL
Albumin: 4.1 g/dL (ref 3.5–5.2)
Alkaline Phosphatase: 72 U/L (ref 39–117)
Bilirubin, Direct: 0 mg/dL (ref 0.0–0.3)
Total Protein: 7.5 g/dL (ref 6.0–8.3)

## 2013-02-23 LAB — BASIC METABOLIC PANEL
BUN: 24 mg/dL — ABNORMAL HIGH (ref 6–23)
CO2: 26 mEq/L (ref 19–32)
Calcium: 9.5 mg/dL (ref 8.4–10.5)
GFR: 50.77 mL/min — ABNORMAL LOW (ref >60.00)
Glucose, Bld: 90 mg/dL (ref 70–99)
Sodium: 140 mEq/L (ref 135–145)

## 2013-02-23 LAB — LDL CHOLESTEROL, DIRECT: Direct LDL: 92 mg/dL

## 2013-02-23 NOTE — Progress Notes (Signed)
Michael Bradley Date of Birth:  1925-12-23 [address] [place], [place]  [address] [phone]         Fax   [phone]  History of Present Illness: This pleasant 79 year old veteran who served in the [place] [employer] is seen for a scheduled followup office visit. 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 He has a history of mild dementia. He has a past history of hypercholesterolemia and a history of hypothyroidism. Since his last visit he has had no new medical problems or complaints.  He lives with his [facility] in [facility].  His wife suffered a recent fall and was unable to come with him to his appointment today.  His niece brought him instead.   Current Outpatient Prescriptions  Medication Sig Dispense Refill  . aspirin 81 MG tablet Take 81 mg by mouth daily.        Marland Kitchen atorvastatin (LIPITOR) 80 MG tablet Take 40 mg by mouth daily.      . chlorpheniramine (CHLOR-TRIMETON) 4 MG tablet Take 4 mg by mouth 2 (two) times daily as needed for allergies.      Marland Kitchen escitalopram (LEXAPRO) 5 MG tablet Take 1 tablet (5 mg total) by mouth daily.  30 tablet  5  . galantamine (RAZADYNE ER) 24 MG 24 hr capsule Take 24 mg by mouth daily with breakfast.      . hydrochlorothiazide 25 MG tablet Take 12.5 mg by mouth daily.        Marland Kitchen levothyroxine (LEVOTHROID) 25 MCG tablet 1 tablet daily except 2 tablets on wed and sun  50 tablet  11  . lisinopril (PRINIVIL,ZESTRIL) 20 MG tablet Take 10 mg by mouth daily.        Marland Kitchen LORazepam (ATIVAN) 0.5 MG tablet Take 0.5 mg by mouth every 8 (eight) hours.        Marland Kitchen omeprazole (PRILOSEC) 20 MG capsule Take 20 mg by mouth at bedtime.       Marland Kitchen terazosin (HYTRIN) 2 MG capsule Take 4 mg by mouth at bedtime.       No current facility-administered medications for this visit.    Allergies  Allergen Reactions  .  Lisinopril Cough  . Quinolones     Patient Active Problem List   Diagnosis Date Noted  . Hypothyroid 08/16/2011  . Vertigo 08/16/2011  . Dementia 12/07/2010  . Hypercholesterolemia 12/07/2010  . Benign hypertensive heart disease without heart failure 12/07/2010  . 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 Dyspnea on exertion 12/07/2010    History  Smoking status  . Former Smoker  . Quit date: 11/28/1980  Smokeless tobacco  . Not on file    History  Alcohol Use No    Comment: 2 alcohol beverages nightly    Family History  Problem Relation Age of Onset  . Lung cancer Father   . Emphysema Father   . Lung cancer Brother   . Leukemia Mother   . Heart attack Brother   . Kidney cancer Brother     Review of Systems: Constitutional: no fever chills diaphoresis or fatigue or change in weight.  Head and neck: no hearing loss, no epistaxis, no photophobia or visual disturbance. Respiratory: No cough, shortness of breath or wheezing. Cardiovascular: No chest pain peripheral edema, palpitations. Gastrointestinal: No abdominal distention, no abdominal pain, no change in  bowel habits hematochezia or melena. Genitourinary: No dysuria, no frequency, no urgency, no nocturia. Musculoskeletal:No arthralgias, no back pain, no gait disturbance or myalgias. Neurological: No dizziness, no headaches, no numbness, no seizures, no syncope, no weakness, no tremors. Hematologic: No lymphadenopathy, no easy bruising. Psychiatric: No confusion, no hallucinations, no sleep disturbance.    Physical Exam: Filed Vitals:   02/23/13 1109  BP: 134/78  Pulse: 59   the general appearance reveals a well-developed well-nourished elderly gentleman in no distress.The head and neck exam reveals pupils equal and reactive.  Extraocular movements are full.  There is no scleral icterus.  The mouth and pharynx are normal.  The neck is supple.  The carotids reveal no bruits.  The jugular venous pressure is normal.   The  thyroid is not enlarged.  There is no lymphadenopathy.  The chest is clear to percussion and auscultation.  There are no rales or rhonchi.  Expansion of the chest is symmetrical.  There are decreased breath sounds at the right base chronically .The precordium is quiet.  The first heart sound is normal.  The second heart sound is physiologically split.  There is no murmur gallop rub or click.  There is no abnormal lift or heave.  The abdomen is soft and nontender.  The bowel sounds are normal.  The liver and spleen are not enlarged.  There are no abdominal masses.  There are no abdominal bruits.  Extremities reveal good pedal pulses.  There is no phlebitis or edema.  There is no cyanosis or clubbing.  Strength is normal and symmetrical in all extremities.  There is no lateralizing weakness.  There are no sensory deficits.  The skin is warm and dry.  There is no rash.   EKG today shows sinus bradycardia with left axis deviation and incomplete right bundle branch block.  There are no prior tracings available for comparison.   Assessment / Plan: Continue on same medication.  Get more regular exercise by joining the exercise classes at [facility].  Blood work today pending.  Recheck in 4 months for office visit lipid panel hepatic function panel and basal metabolic panel and TSH

## 2013-02-23 NOTE — Assessment & Plan Note (Signed)
The patient denies any cough or sputum production.  No chills or fever.

## 2013-02-23 NOTE — Assessment & Plan Note (Signed)
The patient has not been having any chest pain or shortness of breath.  He does complain of his legs feeling weak at times.  He does not get any regular exercise.  I encouraged him to join the group exercise classes which are available to him at [facility].

## 2013-02-23 NOTE — Patient Instructions (Signed)
Will obtain labs today and call you with the results (lp/bmet/hfp)  Your physician recommends that you continue on your current medications as directed. Please refer to the Current Medication list given to you today.  Your physician wants you to follow-up in: 4 months with fasting labs (lp/bmet/hfp/tsh)  You will receive a reminder letter in the mail two months in advance. If you don't receive a letter, please call our office to schedule the follow-up appointment.

## 2013-02-23 NOTE — Progress Notes (Signed)
Quick Note:  Please report to patient. The recent labs are stable. Continue same medication and careful diet. TG are higher. Watch sweets. ______

## 2013-02-23 NOTE — Assessment & Plan Note (Signed)
The patient is clinically euthyroid on current therapy.  We checked his TSH at his last visit.  His appetite is good.  His weight is up 4 pounds since last visit.

## 2013-02-23 NOTE — Assessment & Plan Note (Signed)
The patient has a history of intermittent vertigo and is followed for this by his neurologist

## 2013-02-24 ENCOUNTER — Telehealth: Payer: Self-pay | Admitting: *Deleted

## 2013-02-24 NOTE — Telephone Encounter (Signed)
Message copied by Burnell Blanks on Tue Feb 24, 2013  8:56 AM ------      Message from: Cassell Clement      Created: Mon Feb 23, 2013  9:15 PM       Please report to patient.  The recent labs are stable. Continue same medication and careful diet. TG are higher. Watch sweets. ------

## 2013-02-24 NOTE — Telephone Encounter (Signed)
Mailed copy of labs and left message to call if any questions Highlighted  Dr. Yevonne Pax comments

## 2013-03-03 ENCOUNTER — Encounter: Payer: Self-pay | Admitting: Neurology

## 2013-03-03 ENCOUNTER — Ambulatory Visit (INDEPENDENT_AMBULATORY_CARE_PROVIDER_SITE_OTHER): Payer: Medicare Other | Admitting: Neurology

## 2013-03-03 VITALS — BP 108/60 | HR 64 | Temp 97.7°F | Ht 64.5 in | Wt 172.0 lb

## 2013-03-03 DIAGNOSIS — F329 Major depressive disorder, single episode, unspecified: Secondary | ICD-10-CM | POA: Diagnosis not present

## 2013-03-03 DIAGNOSIS — F411 Generalized anxiety disorder: Secondary | ICD-10-CM

## 2013-03-03 DIAGNOSIS — F419 Anxiety disorder, unspecified: Secondary | ICD-10-CM

## 2013-03-03 DIAGNOSIS — F0390 Unspecified dementia without behavioral disturbance: Secondary | ICD-10-CM

## 2013-03-03 MED ORDER — GALANTAMINE HYDROBROMIDE ER 24 MG PO CP24: 24.0000 mg | ORAL_CAPSULE | Freq: Every day | ORAL | Status: DC

## 2013-03-03 NOTE — Patient Instructions (Signed)
We will continue with the same medications, namely Galantamine SR 24 mg daily and the lexapro 5 mg daily. Follow up routinely in 4 months, sooner if necessary.

## 2013-03-03 NOTE — Progress Notes (Signed)
Subjective:    Patient ID: Michael Bradley is a 79 y.o. male.  HPI    Interim history:   Mr. Michael Bradley is a very pleasant 79 year old right-handed gentleman who presents for followup consultation of his dementia. The patient is accompanied by his wife's niece, Michael Bradley, today, as his wife had a fall and is in [place]. I last saw him on 10/28/2012, at which time I started him on low-dose Lexapro for his depression and I suggested staying with the same dose of galantamine. We discussed initiation of Namenda in the future. His MMSE score from 07/24/2012 was 19/30, clock drawing was 2/4, animal fluency was 7 per minute. In the interim, his wife called back and reported that he stopped taking the Ativan and that he was taking Lexapro when she felt that it was working well for him. Today, Michael Bradley reports that he has been doing well. He has been coping well, what with his wife not with him. She is going to go to [employer]. He has no new complaints. He is asking, whether he can drive again.  I first met him on 07/24/2012, at which time his MMSE was 19, CDT was 2/4 and AFT was 7. He started having more mood related issues recently. I continued his galantamine and suggested adding Namenda. He previously followed with Dr. Avie Echevaria and was last seen by him on 01/14/2012, at which time Dr. Sandria Manly talked to him and his wife about his driving. He now no longer drives.  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  He has a 7+ year history of memory loss, initially treated at the [facility] with Aricept and then changed to Razadyne. There is no family history of memory loss. He has no history of head trauma, drug or alcohol abuse but does drink alcohol on a daily basis. He has never had a stroke. In September 2012 his MMSE was 23, clock drawing was 2, and  animal fluency was 10. In January 2013 his MMSE was 22, clock drawing was 2, and AFT was 8. In September 2012 TSH was 5.26, RPR nonreactive, B12 334. MRI brain without contrast in September 2012 showed atrophy and chronic microvascular ischemia. He started having dizziness in April 2013 and noted loss of hearing. He was diagnosed with positional vertigo. In May 2013 his MMSE was 23, clock drawing was 4, animal fluency was 10.  They reside at [facility] and they get their medicine dispensed.   His Past Medical History Is Significant For: Past Medical History  Diagnosis Date  . Hypertension   . Hyperlipidemia   . Chest pain, atypical   . Fatigue   . Blurred vision   . Palpitations   . Forgetfulness   . ED (erectile dysfunction)   . History of kidney stones   . 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   His Past Surgical History Is Significant For: Past Surgical History  Procedure Laterality Date  . Colonoscopy    . 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 Cardiovascular stress test  10/08/2002    EF 63%  . Lobectomy  1979  . Cataract extraction      os 2006-od 2010   His Family History Is Significant For: Family History  Problem  Relation Age of Onset  . Lung cancer Father   . Emphysema Father   . Lung cancer Brother   . Leukemia Mother   . Heart attack Brother   . Kidney cancer Brother    His Social History Is Significant For: History   Social History  . Marital Status: Married    Spouse Name: N/A    Number of Children: 0  . Years of Education: 12   Occupational History  .     Social History Main Topics  . Smoking status: Former Smoker    Quit date: 11/28/1980  . Smokeless tobacco: None  . Alcohol Use: No     Comment: 2 alcohol beverages nightly  . Drug Use: No  . Sexual Activity: None   Other Topics Concern  . None   Social History Narrative   Pt lives at home with his spouse.    Caffeine Use: 2 cups of caffeine per day.    His Allergies Are:  Allergies  Allergen Reactions  . Lisinopril Cough  . Quinolones   :  His Current Medications Are:  Outpatient Encounter Prescriptions as of 03/03/2013  Medication Sig  . aspirin 81 MG tablet Take 81 mg by mouth daily.    Marland Kitchen atorvastatin (LIPITOR) 80 MG tablet Take 40 mg by mouth daily.  . chlorpheniramine (CHLOR-TRIMETON) 4 MG tablet Take 4 mg by mouth 2 (two) times daily as needed for allergies.  Marland Kitchen escitalopram (LEXAPRO) 5 MG tablet Take 1 tablet (5 mg total) by mouth daily.  Marland Kitchen galantamine (RAZADYNE ER) 24 MG 24 hr capsule Take 1 capsule (24 mg total) by mouth daily with breakfast.  . hydrochlorothiazide 25 MG tablet Take 12.5 mg by mouth daily.    Marland Kitchen levothyroxine (LEVOTHROID) 25 MCG tablet 1 tablet daily except 2 tablets on wed and sun  . lisinopril (PRINIVIL,ZESTRIL) 20 MG tablet Take 10 mg by mouth daily.    Marland Kitchen LORazepam (ATIVAN) 0.5 MG tablet Take 0.5 mg by mouth every 8 (eight) hours.    Marland Kitchen omeprazole (PRILOSEC) 20 MG capsule Take 20 mg by mouth at bedtime.   Marland Kitchen terazosin (HYTRIN) 2 MG capsule Take 4 mg by mouth at bedtime.  . [DISCONTINUED] galantamine (RAZADYNE ER) 24 MG 24 hr capsule Take 24 mg by mouth daily with breakfast.  : Review of Systems:  Out of a complete 14 point review of systems, all are reviewed and negative with the exception of these symptoms as listed below:  Review of Systems  Constitutional: Negative.   HENT: Negative.   Eyes:       Loss of vision  Respiratory: Negative.   Cardiovascular: Negative.   Gastrointestinal: Negative.   Endocrine: Negative.   Genitourinary: Negative.   Musculoskeletal: Positive for arthralgias.  Skin: Negative.   Allergic/Immunologic: Negative.   Neurological:       Memory loss  Hematological: Negative.   Psychiatric/Behavioral: Positive for confusion, dysphoric mood and agitation. The patient is nervous/anxious.     Objective:  Neurologic  Exam  Physical Exam Physical Examination:   Filed Vitals:   03/03/13 1153  BP: 108/60  Pulse: 64  Temp: 97.7 F (36.5 C)    General Examination: The patient is a very pleasant 79 y.o. male in no acute distress. He is calm and cooperative with the exam. He denies Auditory Hallucinations and Visual Hallucinations.   HEENT: Normocephalic, atraumatic, pupils are equal, round and reactive to light and accommodation. Extraocular tracking shows mild saccadic breakdown without nystagmus noted. Funduscopic exam  is difficult. Hearing is impaired. Face is symmetric with no facial masking and normal facial sensation. There is no lip, neck or jaw tremor. Neck is not rigid with intact passive ROM. There are no carotid bruits on auscultation. Oropharynx exam reveals mild mouth dryness. No significant airway crowding is noted. Mallampati is class II. Tongue protrudes centrally and palate elevates symmetrically.    Chest: is clear to auscultation without wheezing, rhonchi or crackles noted.  Heart: sounds are regular and normal without murmurs, rubs or gallops noted.   Abdomen: is soft, non-tender and non-distended with normal bowel sounds appreciated on auscultation.  Extremities: There is no pitting edema in the distal lower extremities bilaterally. Pedal pulses are intact.  Skin: is warm and dry with no trophic changes noted.  Musculoskeletal: exam reveals no obvious joint deformities, tenderness or joint swelling or erythema.  Neurologically:  Mental status: The patient is awake and alert, paying fair  attention. He is able to to partially provide the history, but most of the history is provided by Michael Bradley. He is oriented to: person, not place, but aware of the situation and day of week. His memory, attention, language and knowledge are moderately impaired. There is no aphasia, agnosia, apraxia or anomia. There is a mild degree of bradyphrenia. Speech is mildly hypophonic with no dysarthria noted.  Mood is congruent and affect is blunted and constricted.  His last MMSE score from 07/19/12: 19/30, CDT was 2/4, AFT (Animal Fluency Test) score was 7.  Cranial nerves are as described above under HEENT exam. In addition, shoulder shrug is normal with equal shoulder height noted.  Motor exam: Normal bulk, and strength for age is noted. Tone is not rigid with absence of cogwheeling in the extremities. There is overall no significant bradykinesia. There is no drift or rebound. There is no tremor. Romberg is negative. Reflexes are 1+ in the upper extremities and 1+ in the lower extremities. Toes are downgoing bilaterally. Fine motor skills: Finger taps, hand movements, and rapid alternating patting are not significantly impaired bilaterally. Foot taps and foot agility are mildly impaired bilaterally.   Cerebellar testing shows no dysmetria or intention tremor on finger to nose testing. There is no truncal or gait ataxia.   Sensory exam is intact to light touch.   Gait, station and balance: He stands up from the seated position with mild difficulty and needs to push himself up. No veering to one side is noted. No leaning to one side. Posture is mildly stooped, but age appropriate. Stance is wide-based. He turns in 3 steps. Tandem walk is not possible. Balance is mildly impaired.   Assessment and Plan:   In summary, GUTHRIE LEMME is a very pleasant 79 year old male with a history of advanced dementia, associated with depression. He had a MMSE of 19/30 in 5/14 and he has been on Lexapro since 8/14. He has remained stable. He no longer drives, but asked, whether he could resume driving at this time. He is hard of hearing, he has vision loss, and because of his memory loss I advised him no longer to drive. He is clearly disappointed in that regard. He is advised to continue with the same medications, namely galantamine and Lexapro from my end of things. We talked about adding Namenda at some point but at  this juncture, he seems stable and I do not want to rock the boat too much. We talked about maintaining a healthy lifestyle in general. I encouraged the patient to eat healthy,  exercise daily and keep well hydrated, to keep a scheduled bedtime and wake time routine, to not skip any meals and eat healthy snacks in between meals and to have protein with every meal.   I answered all their questions today and the patient and his wife's niece were in agreement with the above outlined plan. I would like to see the patient back in 4 months, sooner if the need arises and encouraged them to call with any interim questions, concerns, problems or updates and refill requests. I did provide them with a 90 day prescription for galantamine.

## 2013-03-31 ENCOUNTER — Encounter (INDEPENDENT_AMBULATORY_CARE_PROVIDER_SITE_OTHER): Payer: Medicare Other | Admitting: Ophthalmology

## 2013-03-31 DIAGNOSIS — H3530 Unspecified macular degeneration: Secondary | ICD-10-CM

## 2013-03-31 DIAGNOSIS — I10 Essential (primary) hypertension: Secondary | ICD-10-CM | POA: Diagnosis not present

## 2013-03-31 DIAGNOSIS — H35039 Hypertensive retinopathy, unspecified eye: Secondary | ICD-10-CM

## 2013-03-31 DIAGNOSIS — H43819 Vitreous degeneration, unspecified eye: Secondary | ICD-10-CM

## 2013-04-07 ENCOUNTER — Other Ambulatory Visit: Payer: Self-pay | Admitting: *Deleted

## 2013-04-07 DIAGNOSIS — F419 Anxiety disorder, unspecified: Secondary | ICD-10-CM

## 2013-04-07 NOTE — Telephone Encounter (Signed)
Left message  Dr. Mare Ferrari not in the office. Will forward to  Dr. Mare Ferrari for review

## 2013-04-07 NOTE — Telephone Encounter (Signed)
Michael Bradley from [facility] called to see if Dr Mare Ferrari would increase frequency of lorazepam for the patient. She stated that his wife is not expected to live much longer and for this she believes he needs a more frequent dose. She would like it called in to [facility] and then a voided hard copy faxed to [phone]. Michael Bradley can be reached at [phone]. Thanks, MI

## 2013-04-07 NOTE — Telephone Encounter (Signed)
Okay to increase frequency to 4x a day as needed.

## 2013-04-08 ENCOUNTER — Other Ambulatory Visit: Payer: Self-pay

## 2013-04-08 MED ORDER — LORAZEPAM 0.5 MG PO TABS: 0.5000 mg | ORAL_TABLET | Freq: Four times a day (QID) | ORAL | Status: DC | PRN

## 2013-04-08 MED ORDER — HYDROCHLOROTHIAZIDE 25 MG PO TABS: 12.5000 mg | ORAL_TABLET | Freq: Every day | ORAL | Status: DC

## 2013-04-08 NOTE — Telephone Encounter (Signed)
Will fax note, called to [facility]

## 2013-04-22 ENCOUNTER — Telehealth: Payer: Self-pay | Admitting: Neurology

## 2013-04-22 NOTE — Telephone Encounter (Signed)
Patient's wife called to state that she has noticed a change in patient, that he has become more paranoid and doesn't trust people at all. Patient's wife states she noticed that it started about 3 weeks ago.Patient was last seen on [phone] for a office visit best contact is [phone] her name is shirley

## 2013-04-22 NOTE — Telephone Encounter (Signed)
Patient's wife called to state that she has noticed a change in patient, that he has become more paranoid and doesn't trust people at all. Patient's wife states she noticed that it started about 3 weeks ago. Please call and advise.

## 2013-04-24 NOTE — Telephone Encounter (Signed)
Please advise patient's wife that changes in personality and mental state are often seen in the context of memory loss. However, if this is a significant change from his baseline and has started fairly recently, it is best to have him checked out for any other medical issues such as electrolyte disturbance, dehydration, upper respiratory infection or urinary tract infection. Has he been seen by his primary care physician yet, if not, this is where I would like for him to start.

## 2013-04-24 NOTE — Telephone Encounter (Signed)
Dr. Rexene Alberts please see note below

## 2013-04-24 NOTE — Telephone Encounter (Signed)
Shared message from Dr Rexene Alberts with patient's wife, she verbalized understanding.

## 2013-05-15 ENCOUNTER — Other Ambulatory Visit: Payer: Self-pay

## 2013-05-15 MED ORDER — ATORVASTATIN CALCIUM 80 MG PO TABS: 40.0000 mg | ORAL_TABLET | Freq: Every day | ORAL | Status: AC

## 2013-05-22 ENCOUNTER — Telehealth: Payer: Self-pay | Admitting: Cardiology

## 2013-05-22 NOTE — Telephone Encounter (Signed)
Ivin Booty med tech per wife

## 2013-05-22 NOTE — Telephone Encounter (Signed)
Wife states blood pressure was elevated at [facility] today but was told not to worry about it. She was not sure exactly what number was 170/?. Patient only gets monthly checks. Called [facility] [phone]) and left message to call back.

## 2013-05-22 NOTE — Telephone Encounter (Signed)
New message     Pt is at [facility].  They said his bp is high but for the patient not to worry.  Wife is concerned and want to talk to a nurse

## 2013-07-07 ENCOUNTER — Encounter (INDEPENDENT_AMBULATORY_CARE_PROVIDER_SITE_OTHER): Payer: Medicare Other | Admitting: Ophthalmology

## 2013-07-08 ENCOUNTER — Encounter (INDEPENDENT_AMBULATORY_CARE_PROVIDER_SITE_OTHER): Payer: Medicare Other | Admitting: Ophthalmology

## 2013-07-08 DIAGNOSIS — H3530 Unspecified macular degeneration: Secondary | ICD-10-CM | POA: Diagnosis not present

## 2013-07-08 DIAGNOSIS — I10 Essential (primary) hypertension: Secondary | ICD-10-CM

## 2013-07-08 DIAGNOSIS — H43819 Vitreous degeneration, unspecified eye: Secondary | ICD-10-CM | POA: Diagnosis not present

## 2013-07-08 DIAGNOSIS — H35039 Hypertensive retinopathy, unspecified eye: Secondary | ICD-10-CM

## 2013-07-09 NOTE — Telephone Encounter (Signed)
Never heard back from facility, patient has ov next week.

## 2013-07-15 ENCOUNTER — Ambulatory Visit (INDEPENDENT_AMBULATORY_CARE_PROVIDER_SITE_OTHER): Payer: Medicare Other | Admitting: Cardiology

## 2013-07-15 ENCOUNTER — Encounter: Payer: Self-pay | Admitting: Cardiology

## 2013-07-15 VITALS — BP 140/68 | HR 76 | Ht 64.5 in | Wt 167.0 lb

## 2013-07-15 DIAGNOSIS — F0390 Unspecified dementia without behavioral disturbance: Secondary | ICD-10-CM

## 2013-07-15 DIAGNOSIS — E039 Hypothyroidism, unspecified: Secondary | ICD-10-CM | POA: Diagnosis not present

## 2013-07-15 DIAGNOSIS — E7800 Pure hypercholesterolemia, unspecified: Secondary | ICD-10-CM | POA: Diagnosis not present

## 2013-07-15 DIAGNOSIS — I119 Hypertensive heart disease without heart failure: Secondary | ICD-10-CM | POA: Diagnosis not present

## 2013-07-15 LAB — HEPATIC FUNCTION PANEL
ALBUMIN: 4.2 g/dL (ref 3.5–5.2)
ALT: 19 U/L (ref 0–53)
AST: 26 U/L (ref 0–37)
Alkaline Phosphatase: 73 U/L (ref 39–117)
Bilirubin, Direct: 0 mg/dL (ref 0.0–0.3)
TOTAL PROTEIN: 7.5 g/dL (ref 6.0–8.3)
Total Bilirubin: 0.4 mg/dL (ref 0.3–1.2)

## 2013-07-15 LAB — BASIC METABOLIC PANEL
BUN: 23 mg/dL (ref 6–23)
CO2: 26 mEq/L (ref 19–32)
Calcium: 9.4 mg/dL (ref 8.4–10.5)
Chloride: 105 mEq/L (ref 96–112)
Creatinine, Ser: 1.4 mg/dL (ref 0.4–1.5)
GFR: 51.14 mL/min — AB (ref >60.00)
Glucose, Bld: 95 mg/dL (ref 70–99)
POTASSIUM: 3.8 meq/L (ref 3.5–5.1)
SODIUM: 139 meq/L (ref 135–145)

## 2013-07-15 LAB — LIPID PANEL
Cholesterol: 127 mg/dL (ref 0–200)
HDL: 32.3 mg/dL — ABNORMAL LOW (ref >39.00)
LDL Cholesterol: 77 mg/dL (ref 0–99)
Total CHOL/HDL Ratio: 4
Triglycerides: 87 mg/dL (ref 0.0–149.0)
VLDL: 17.4 mg/dL (ref 0.0–40.0)

## 2013-07-15 LAB — TSH: TSH: 2.48 u[IU]/mL (ref 0.35–5.50)

## 2013-07-15 NOTE — Patient Instructions (Signed)
Will obtain labs today and call you with the results (lp/bmet/hfp/tsh)  Your physician recommends that you continue on your current medications as directed. Please refer to the Current Medication list given to you today.  Your physician wants you to follow-up in: 6 months with fasting labs (lp/bmet/hfptsh) and ekg You will receive a reminder letter in the mail two months in advance. If you don't receive a letter, please call our office to schedule the follow-up appointment.

## 2013-07-15 NOTE — Assessment & Plan Note (Signed)
The patient is clinically euthyroid.  We're checking a TSH today.

## 2013-07-15 NOTE — Progress Notes (Signed)
Quick Note:  Please report to patient. The recent labs are stable. Continue same medication and careful diet. ______ 

## 2013-07-15 NOTE — Assessment & Plan Note (Signed)
His dementia appears to be progressively worse.  He is unable to update me on his wife's condition today.

## 2013-07-15 NOTE — Progress Notes (Signed)
Michael Bradley Date of Birth:  November 05, 1925 [place] [address] [place], [place]  [address] [phone]        Fax   [phone]   History of Present Illness: This pleasant 80 year old veteran who served in the [employer] is seen for a scheduled followup office visit. 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 He has a history of mild dementia. He has a past history of hypercholesterolemia and a history of hypothyroidism. Since his last visit he has had no new medical problems or complaints.  He lives in the memory care portion of [facility].   Current Outpatient Prescriptions  Medication Sig Dispense Refill  . aspirin 81 MG tablet Take 81 mg by mouth daily.        Marland Kitchen atorvastatin (LIPITOR) 80 MG tablet Take 0.5 tablets (40 mg total) by mouth daily.  45 tablet  2  . chlorpheniramine (CHLOR-TRIMETON) 4 MG tablet Take 4 mg by mouth 2 (two) times daily as needed for allergies.      Marland Kitchen escitalopram (LEXAPRO) 5 MG tablet Take 1 tablet (5 mg total) by mouth daily.  30 tablet  5  . galantamine (RAZADYNE ER) 24 MG 24 hr capsule Take 1 capsule (24 mg total) by mouth daily with breakfast.  90 capsule  3  . hydrochlorothiazide (HYDRODIURIL) 25 MG tablet Take 0.5 tablets (12.5 mg total) by mouth daily.  30 tablet  6  . levothyroxine (LEVOTHROID) 25 MCG tablet 1 tablet daily except 2 tablets on wed and sun  50 tablet  11  . lisinopril (PRINIVIL,ZESTRIL) 20 MG tablet Take 10 mg by mouth daily.        Marland Kitchen LORazepam (ATIVAN) 0.5 MG tablet Take 1 tablet (0.5 mg total) by mouth 4 (four) times daily as needed for anxiety.  120 tablet  5  . omeprazole (PRILOSEC) 20 MG capsule Take 20 mg by mouth at bedtime.       Marland Kitchen terazosin (HYTRIN) 2 MG capsule Take 4 mg by mouth at bedtime.       No current facility-administered medications for this visit.     Allergies  Allergen Reactions  . Lisinopril Cough  . Quinolones     Patient Active Problem List   Diagnosis Date Noted  . Hypothyroid 08/16/2011  . Vertigo 08/16/2011  . Dementia 12/07/2010  . Hypercholesterolemia 12/07/2010  . Benign hypertensive heart disease without heart failure 12/07/2010  . 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 Dyspnea on exertion 12/07/2010    History  Smoking status  . Former Smoker  . Quit date: 11/28/1980  Smokeless tobacco  . Not on file    History  Alcohol Use No    Comment: 2 alcohol beverages nightly    Family History  Problem Relation Age of Onset  . Lung cancer Father   . Emphysema Father   . Lung cancer Brother   . Leukemia Mother   . Heart attack Brother   . Kidney cancer Brother     Review of Systems: Constitutional: no fever chills diaphoresis or fatigue or change in weight.  Head and neck: no hearing loss, no epistaxis, no photophobia or visual disturbance. Respiratory: No cough, shortness of breath or wheezing. Cardiovascular: No chest pain peripheral edema, palpitations. Gastrointestinal: No abdominal distention, no abdominal pain, no change in bowel habits hematochezia  or melena. Genitourinary: No dysuria, no frequency, no urgency, no nocturia. Musculoskeletal:No arthralgias, no back pain, no gait disturbance or myalgias. Neurological: No dizziness, no headaches, no numbness, no seizures, no syncope, no weakness, no tremors. Hematologic: No lymphadenopathy, no easy bruising. Psychiatric: No confusion, no hallucinations, no sleep disturbance.    Physical Exam: Filed Vitals:   07/15/13 1338  BP: 140/68  Pulse: 76   the general appearance reveals a well-developed well-nourished gentleman in no distress.  He is demented.The head and neck exam reveals pupils equal and reactive.  Extraocular movements are full.  There is no scleral icterus.  The mouth and pharynx are normal.  The neck is supple.  The carotids reveal  no bruits.  The jugular venous pressure is normal.  The  thyroid is not enlarged.  There is no lymphadenopathy.  The chest is clear to percussion and auscultation.  Decreased breath sounds at the right base which is chronic. There are no rales or rhonchi.  Expansion of the chest is symmetrical.  The precordium is quiet.  The first heart sound is normal.  The second heart sound is physiologically split.  There is no murmur gallop rub or click.  There is no abnormal lift or heave.  The abdomen is soft and nontender.  The bowel sounds are normal.  The liver and spleen are not enlarged.  There are no abdominal masses.  There are no abdominal bruits.  Extremities reveal good pedal pulses.  There is no phlebitis or edema.  There is no cyanosis or clubbing.  Strength is normal and symmetrical in all extremities.  There is no lateralizing weakness.  There are no sensory deficits.  The skin is warm and dry.  There is no rash.     Assessment / Plan: 1.  Essential hypertension 2. Hypercholesterolemia 3.  Dementia 4. Hypothyroidism 5. history of carcinoid tumor of right lung with prior surgical resection of right middle lobe and right lower lobe in 1971.  Plan: Continue same medication.  Recheck in 6 months for followup office visit EKG lipid panel hepatic function panel basal metabolic panel and TSH. Blood work drawn today is pending.

## 2013-07-15 NOTE — Assessment & Plan Note (Signed)
His blood pressure has been remaining stable.  He denies any chest pain or shortness of breath or palpitations.

## 2013-07-21 ENCOUNTER — Ambulatory Visit (INDEPENDENT_AMBULATORY_CARE_PROVIDER_SITE_OTHER): Payer: Medicare Other | Admitting: Neurology

## 2013-07-21 ENCOUNTER — Encounter: Payer: Self-pay | Admitting: Neurology

## 2013-07-21 VITALS — BP 143/81 | HR 59 | Temp 97.1°F | Ht 64.5 in | Wt 168.0 lb

## 2013-07-21 DIAGNOSIS — F329 Major depressive disorder, single episode, unspecified: Secondary | ICD-10-CM

## 2013-07-21 DIAGNOSIS — F419 Anxiety disorder, unspecified: Secondary | ICD-10-CM

## 2013-07-21 DIAGNOSIS — F411 Generalized anxiety disorder: Secondary | ICD-10-CM | POA: Diagnosis not present

## 2013-07-21 DIAGNOSIS — H3530 Unspecified macular degeneration: Secondary | ICD-10-CM

## 2013-07-21 DIAGNOSIS — F32A Depression, unspecified: Secondary | ICD-10-CM

## 2013-07-21 DIAGNOSIS — F3289 Other specified depressive episodes: Secondary | ICD-10-CM

## 2013-07-21 DIAGNOSIS — F0390 Unspecified dementia without behavioral disturbance: Secondary | ICD-10-CM

## 2013-07-21 DIAGNOSIS — H9190 Unspecified hearing loss, unspecified ear: Secondary | ICD-10-CM

## 2013-07-21 MED ORDER — GALANTAMINE HYDROBROMIDE ER 24 MG PO CP24: 24.0000 mg | ORAL_CAPSULE | Freq: Every day | ORAL | Status: DC

## 2013-07-21 NOTE — Progress Notes (Signed)
Subjective:    Patient ID: Michael Bradley is a 80 y.o. male.  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 The patient is accompanied by his wife's niece, Michael Bradley, again today, as his wife is at [facility]. I last saw him on 03/03/2013, at which time I felt he was fairly stable. He started Lexapro in August 2014. He did ask whether she could resume driving and I felt that he was not safe to drive because of his memory loss, hearing loss, and impaired vision. I continued his galantamine and considered adding Namenda in the near future. He resides at [facility], in assisted living.   Today, he reports feeling stable. His niece feels, he has been doing well, considering, that he has been by himself. She says, the staff has told her, he needs little assistance.   I saw him on 10/28/2012, at which time I started him on low-dose Lexapro for his depression and I suggested staying with the same dose of galantamine. We discussed initiation of Namenda in the future. His MMSE score from 07/24/2012 was 19/30, clock drawing was 2/4, animal fluency was 7 per minute. In the interim, his wife called back and reported that he stopped taking the Ativan and that he was taking Lexapro when she felt that it was working well for him.  I first met him on 07/24/2012, at which time his MMSE was 19, CDT was 2/4 and AFT was 7. He started having more mood related issues recently. I continued his galantamine and suggested adding Namenda.  He previously followed with Dr. Morene Antu and was last seen by him on 01/14/2012, at which time Dr. Erling Cruz talked to him and his wife about his driving. He now no longer drives.  He  has a 8+ year history of memory loss, initially treated at the [facility] with Aricept and then changed to [place]. There is no family history of memory loss. He has no history of head trauma, drug or alcohol abuse but does drink alcohol on a daily basis. He has never had a stroke. In September 2012 his MMSE was 23, clock drawing was 2, and animal fluency was 10. In January 2013 his MMSE was 22, clock drawing was 2, and AFT was 8. In September 2012 TSH was 5.26, RPR nonreactive, B12 334. MRI brain without contrast in September 2012 showed atrophy and chronic microvascular ischemia. He started having dizziness in April 2013 and noted loss of hearing. He was diagnosed with positional vertigo. In May 2013 his MMSE was 23, clock drawing was 4, animal fluency was 10.   His Past Medical History Is Significant For: Past Medical History  Diagnosis Date  . Hypertension   . Hyperlipidemia   . Chest pain, atypical   . Fatigue   . Blurred vision   . Palpitations   . Forgetfulness   . ED (erectile dysfunction)   . History of kidney stones   . 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    His Past Surgical History Is Significant For: Past Surgical History  Procedure Laterality Date  . Colonoscopy    . Thoracotomy  1971    WITH REMOVAL  OF CARCINOID TUMOR  . Cardiovascular stress test  10/08/2002    EF 63%  . Lobectomy  1979  . Cataract extraction      os 2006-od 2010    His Family History Is Significant For: Family History  Problem Relation Age of Onset  . Lung cancer Father   . Emphysema Father   . Lung cancer Brother   . Leukemia Mother   . Heart attack Brother   . Kidney cancer Brother     His Social History Is Significant For: History   Social History  . Marital Status: Married    Spouse Name: N/A    Number of Children: 0  . Years of Education: 12   Occupational History  .     Social History Main Topics  . Smoking status: Former Smoker     Quit date: 11/28/1980  . Smokeless tobacco: None  . Alcohol Use: No     Comment: 2 alcohol beverages nightly  . Drug Use: No  . Sexual Activity: None   Other Topics Concern  . None   Social History Narrative   Pt lives at home with his spouse.   Caffeine Use: 2 cups of caffeine per day.     His Allergies Are:  Allergies  Allergen Reactions  . Lisinopril Cough  . Quinolones   :   His Current Medications Are:  Outpatient Encounter Prescriptions as of 07/21/2013  Medication Sig  . aspirin 81 MG tablet Take 81 mg by mouth daily.    Marland Kitchen atorvastatin (LIPITOR) 80 MG tablet Take 0.5 tablets (40 mg total) by mouth daily.  . chlorpheniramine (CHLOR-TRIMETON) 4 MG tablet Take 4 mg by mouth 2 (two) times daily as needed for allergies.  Marland Kitchen escitalopram (LEXAPRO) 5 MG tablet Take 1 tablet (5 mg total) by mouth daily.  Marland Kitchen galantamine (RAZADYNE ER) 24 MG 24 hr capsule Take 1 capsule (24 mg total) by mouth daily with breakfast.  . hydrochlorothiazide (HYDRODIURIL) 25 MG tablet Take 0.5 tablets (12.5 mg total) by mouth daily.  Marland Kitchen levothyroxine (LEVOTHROID) 25 MCG tablet 1 tablet daily except 2 tablets on wed and sun  . lisinopril (PRINIVIL,ZESTRIL) 20 MG tablet Take 10 mg by mouth daily.    Marland Kitchen LORazepam (ATIVAN) 0.5 MG tablet Take 1 tablet (0.5 mg total) by mouth 4 (four) times daily as needed for anxiety.  Marland Kitchen omeprazole (PRILOSEC) 20 MG capsule Take 20 mg by mouth at bedtime.   Marland Kitchen terazosin (HYTRIN) 2 MG capsule Take 4 mg by mouth at bedtime.  . [DISCONTINUED] galantamine (RAZADYNE ER) 24 MG 24 hr capsule Take 1 capsule (24 mg total) by mouth daily with breakfast.  :  Review of Systems:  Out of a complete 14 point review of systems, all are reviewed and negative with the exception of these symptoms as listed below:  Review of Systems  Constitutional: Negative.   HENT: Positive for hearing loss.   Eyes: Negative.   Respiratory: Negative.   Cardiovascular: Negative.   Gastrointestinal:  Negative.   Endocrine: Negative.   Genitourinary: Negative.   Musculoskeletal: Negative.   Skin: Negative.   Allergic/Immunologic: Positive for environmental allergies.  Neurological: Positive for dizziness (When a passenger in a moving car.).  Hematological: Negative.   Psychiatric/Behavioral: Negative.     Objective:  Neurologic Exam  Physical Exam Physical Examination:   Filed Vitals:   07/21/13 1442  BP: 143/81  Bradley: 59  Temp: 97.1 F (36.2 C)    General Examination: The patient is  a very pleasant 80 y.o. male in no acute distress. He is calm and cooperative with the exam. He denies Auditory Hallucinations and Visual Hallucinations.   HEENT: Normocephalic, atraumatic, pupils are equal, round and reactive to light and accommodation. Extraocular tracking shows mild saccadic breakdown without nystagmus noted. Funduscopic exam is difficult. Hearing is impaired, and seems worse than last time. Face is symmetric with no facial masking and normal facial sensation. There is no lip, neck or jaw tremor. Neck is not rigid with intact passive ROM. There are no carotid bruits on auscultation. Oropharynx exam reveals mild mouth dryness. No significant airway crowding is noted. Mallampati is class II. Tongue protrudes centrally and palate elevates symmetrically.    Chest: is clear to auscultation without wheezing, rhonchi or crackles noted.  Heart: sounds are regular and normal without murmurs, rubs or gallops noted.   Abdomen: is soft, non-tender and non-distended with normal bowel sounds appreciated on auscultation.  Extremities: There is no pitting edema in the distal lower extremities bilaterally. Pedal pulses are intact.  Skin: is warm and dry with no trophic changes noted.  Musculoskeletal: exam reveals no obvious joint deformities, tenderness or joint swelling or erythema.  Neurologically:  Mental status: The patient is awake and alert, paying fair  attention. He is able to to  partially provide the history, but most of the history is provided by Michael Bradley. He is oriented to: person, not place, but aware of the situation and day of week. His memory, attention, language and knowledge are moderately impaired. There is no aphasia, agnosia, apraxia or anomia, but he has word finding difficulty. There is a mild degree of bradyphrenia, worse from last time. Speech is mildly hypophonic with no dysarthria noted. Mood is congruent and affect is blunted and constricted.  His last MMSE score from 07/19/12: 19/30, CDT was 2/4, AFT (Animal Fluency Test) score was 7. On 07/21/13: MMSE was 19/30, CDT was 2/4, AFT 4/minute.  Cranial nerves are as described above under HEENT exam. In addition, shoulder shrug is normal with equal shoulder height noted.  Motor exam: Normal bulk, and strength for age is noted. Tone is not rigid with absence of cogwheeling in the extremities. There is overall no significant bradykinesia. There is no drift or rebound. There is no tremor. Romberg is negative. Reflexes are 1+ in the upper extremities and 1+ in the lower extremities. Toes are downgoing bilaterally. Fine motor skills: Finger taps, hand movements, and rapid alternating patting are mildly impaired bilaterally. Foot taps and foot agility are mildly impaired bilaterally.   Cerebellar testing shows no dysmetria or intention tremor on finger to nose testing. There is no truncal or gait ataxia.   Sensory exam is intact to light touch, PP and temperature in the UEs and LEs.   Gait, station and balance: He stands up from the seated position with mild difficulty and needs to push himself up. No veering to one side is noted. No leaning to one side. Posture is mildly stooped, but age appropriate. Stance is wide-based. He turns in 3 steps. Tandem walk is not possible. Balance is mildly impaired.   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 He had a MMSE of 19/30 in 5/14 and he has been on Lexapro since 8/14. While his MMSE and clock drawing of this time, he does have more issues achieving the same number scores today. He no longer drives. I would like to introduce Namenda XR 7 mg strength once daily in the morning today and continues galantamine long-acting. I prescribed Namenda SR and also provided him with written instructions as well as potential side effects to look out for. His hearing loss seems to be worse. His knee he states that he was checked at the [place] and his hearing was apparently fine. I encouraged the patient to eat healthy, exercise daily and keep well hydrated, to keep a scheduled bedtime and wake time routine, to not skip any meals and eat healthy snacks in between meals and to have protein with every meal.   I answered all their questions today and the patient and his wife's niece were in agreement with the above outlined plan. I would like to see the patient back in 4 months, sooner if the need arises and encouraged them to call with any interim questions, concerns, problems or updates and refill requests. I did provide him with refills on the galantamine and a new prescription for Namenda XR 7 mg once daily.

## 2013-07-21 NOTE — Patient Instructions (Signed)
We will continue your galantamine for your memory and  I would like to suggest starting you on a second memory medication, Namenda XR, starting at 7 mg once daily with gradual build up. Side effects include: nausea, confusion, hallucination, personality changes. If you are having mild side effects, try to stick with the treatment as these initial side effects may go away after the first 10-14 days.

## 2013-07-29 ENCOUNTER — Telehealth: Payer: Self-pay | Admitting: Neurology

## 2013-07-29 MED ORDER — MEMANTINE HCL ER 7 MG PO CP24: 7.0000 mg | ORAL_CAPSULE | Freq: Every day | ORAL | Status: DC

## 2013-07-29 NOTE — Telephone Encounter (Signed)
I must have not done the prescription it looks like. Please start Namenda XR 7 mg strength, one pill once daily #30 with 2 refills, we will do a gradual titration. Please encourage wife to call in about 3 weeks so we can consider an increase to the 14 mg strength at the time. thx

## 2013-07-29 NOTE — Telephone Encounter (Signed)
Rx has been added and sent.  I called the patient back.  Spoke with Michael Bradley.  She is aware Rx has been sent and says they will call back with update after a couple weeks.

## 2013-07-29 NOTE — Telephone Encounter (Signed)
Patient is requesting Rx for Namenda XR 7 mg.  OV notes said he can start at this dose and gradually build up.  I will be happy to send the Rx, however, could you please verify how long you would like the patient to remain on 7mg  before titration to the next dose?  Please advise.  Thank you.

## 2013-07-29 NOTE — Telephone Encounter (Signed)
Pt called wanting to find out about his prescription for Namenda XR 7 mg once daily, this prescription was not called in to the pharmacy and per Dr. Guadelupe Sabin notes this was going to be prescribed for pt. Please call Enid Derry to advise once this has been called into the pharmacy. Thanks

## 2013-07-30 ENCOUNTER — Telehealth: Payer: Self-pay | Admitting: *Deleted

## 2013-07-30 NOTE — Telephone Encounter (Signed)
Message copied by Earvin Hansen on Thu Jul 30, 2013 12:37 PM ------      Message from: Darlin Coco      Created: Wed Jul 15, 2013  8:20 PM       Please report to patient.  The recent labs are stable. Continue same medication and careful diet. ------

## 2013-07-30 NOTE — Telephone Encounter (Signed)
Advised wife of labs 

## 2013-08-06 ENCOUNTER — Other Ambulatory Visit: Payer: Self-pay

## 2013-08-06 MED ORDER — MEMANTINE HCL ER 7 MG PO CP24: 7.0000 mg | ORAL_CAPSULE | Freq: Every day | ORAL | Status: DC

## 2013-08-06 NOTE — Telephone Encounter (Signed)
Michael Bradley is requesting a written Rx for Namenda.  They are unable to accept E-Rx's.  Thank you.

## 2013-08-06 NOTE — Telephone Encounter (Signed)
Rx signed and faxed.

## 2013-08-29 DIAGNOSIS — N390 Urinary tract infection, site not specified: Secondary | ICD-10-CM | POA: Diagnosis not present

## 2013-09-04 ENCOUNTER — Telehealth: Payer: Self-pay | Admitting: *Deleted

## 2013-09-04 MED ORDER — NITROFURANTOIN MONOHYD MACRO 100 MG PO CAPS: ORAL_CAPSULE | ORAL | Status: DC

## 2013-09-04 NOTE — Telephone Encounter (Signed)
Left message for pt to call, recent labs show the patient has a UTI. Per dr Mare Ferrari the patient will need generic macrobid 50 mg every 6 hours x 7 days #30 no refills.

## 2013-09-04 NOTE — Telephone Encounter (Signed)
Left message for patient, script sent to the pharm.

## 2013-09-08 NOTE — Telephone Encounter (Signed)
Faxed UA with  Dr. Sherryl Barters recommendations to [facility]

## 2013-10-13 ENCOUNTER — Other Ambulatory Visit: Payer: Self-pay | Admitting: Neurology

## 2013-10-13 NOTE — Telephone Encounter (Signed)
Star Age, MD at 11/03/2012 1:17 PM     Status: Signed        Please advise patient or his wife to continue to take the Lexapro which is the new medicine

## 2013-10-21 ENCOUNTER — Other Ambulatory Visit: Payer: Self-pay | Admitting: Cardiology

## 2013-10-28 ENCOUNTER — Encounter (INDEPENDENT_AMBULATORY_CARE_PROVIDER_SITE_OTHER): Payer: Medicare Other | Admitting: Ophthalmology

## 2013-10-28 DIAGNOSIS — H3530 Unspecified macular degeneration: Secondary | ICD-10-CM

## 2013-10-28 DIAGNOSIS — H43819 Vitreous degeneration, unspecified eye: Secondary | ICD-10-CM | POA: Diagnosis not present

## 2013-10-28 DIAGNOSIS — H35039 Hypertensive retinopathy, unspecified eye: Secondary | ICD-10-CM | POA: Diagnosis not present

## 2013-10-28 DIAGNOSIS — I10 Essential (primary) hypertension: Secondary | ICD-10-CM

## 2013-11-03 ENCOUNTER — Other Ambulatory Visit: Payer: Self-pay | Admitting: Neurology

## 2013-12-17 ENCOUNTER — Encounter (INDEPENDENT_AMBULATORY_CARE_PROVIDER_SITE_OTHER): Payer: Self-pay

## 2013-12-17 ENCOUNTER — Ambulatory Visit (INDEPENDENT_AMBULATORY_CARE_PROVIDER_SITE_OTHER): Payer: Medicare Other | Admitting: Neurology

## 2013-12-17 ENCOUNTER — Encounter: Payer: Self-pay | Admitting: Neurology

## 2013-12-17 VITALS — BP 131/69 | HR 63 | Temp 97.2°F | Ht 60.0 in | Wt 165.6 lb

## 2013-12-17 DIAGNOSIS — H3530 Unspecified macular degeneration: Secondary | ICD-10-CM | POA: Diagnosis not present

## 2013-12-17 DIAGNOSIS — F0390 Unspecified dementia without behavioral disturbance: Secondary | ICD-10-CM

## 2013-12-17 DIAGNOSIS — H9193 Unspecified hearing loss, bilateral: Secondary | ICD-10-CM | POA: Diagnosis not present

## 2013-12-17 MED ORDER — GALANTAMINE HYDROBROMIDE ER 24 MG PO CP24: 24.0000 mg | ORAL_CAPSULE | Freq: Every day | ORAL | Status: DC

## 2013-12-17 MED ORDER — MEMANTINE HCL ER 14 MG PO CP24: 14.0000 mg | ORAL_CAPSULE | Freq: Every day | ORAL | Status: DC

## 2013-12-17 NOTE — Patient Instructions (Addendum)
I think overall you are doing fairly well but I do want to suggest a few things today:  Remember to drink plenty of fluid, eat healthy meals and do not skip any meals. Try to eat protein with a every meal and eat a healthy snack such as fruit or nuts in between meals. Try to keep a regular sleep-wake schedule and try to exercise daily, particularly in the form of walking, 20-30 minutes a day, if you can. Good nutrition, proper sleep and exercise can help her cognitive function.  Engage in social activities in your community and with your family and try to keep up with current events by reading the newspaper or watching the news. If you have computer and can go online, try [email or web address]. Also, you may like to do word finding puzzles or crossword puzzles.  As far as your medications are concerned, I would like to suggest that we increase your namenda XR to 14 mg daily.    As far as diagnostic testing: no new test needed from my end of things.   I would like to see you suggest, that you come back in 3 months to see Ms. Lam Nurse practitioner and I will see you back after that.   Our phone number is [phone]. We also have an after hours call service for urgent matters and there is a physician on-call for urgent questions. For any emergencies you know to call 911 or go to the nearest emergency room.

## 2013-12-17 NOTE — Progress Notes (Signed)
Subjective:    Patient ID: Michael Bradley is a 80 y.o. male.  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 The patient is accompanied by his wife's nephew, Michael Bradley (Michael Bradley's brother) today. His wife unfortunately passed away 2 months ago. I last saw him on 07/21/2013, at which time I felt he had declined clinically, he felt stable and his niece reported, that he was doing well considering, he was by himself. Staff had told her, he needed little assistance. I suggested we start him on Namenda long-acting 7 mg strength and continued Razadyne ER.  Today, he reports feeling fair. He misses his wife. He has a dog. His appetite is good. He feels, the [place] has helped. He has had no falls. He is in assisted living at [facility].   03/03/2013, at which time I felt he was fairly stable. He started Lexapro in August 2014. He did ask whether she could resume driving and I felt that he was not safe to drive because of his memory loss, hearing loss, and impaired vision. I continued his galantamine and considered adding Namenda in the near future. He resides at [facility], in assisted living.  I saw him on 10/28/2012, at which time I started him on low-dose Lexapro for his depression and I suggested staying with the same dose of galantamine. We discussed initiation of Namenda in the future. His MMSE score from 07/24/2012 was 19/30, clock drawing was 2/4, animal fluency was 7 per minute. In the interim, his wife called back and reported that he stopped taking the Ativan and that he was taking Lexapro when she felt that it was working well for him.  I first met him on 07/24/2012, at which time his MMSE was  19, CDT was 2/4 and AFT was 7. He started having more mood related issues recently. I continued his galantamine and suggested adding Namenda.  He previously followed with Dr. Morene Antu and was last seen by him on 01/14/2012, at which time Dr. Erling Cruz talked to him and his wife about his driving. He now no longer drives.  He has a several (9+) year history of memory loss, initially treated at the [facility] with Aricept and then changed to [place]. There is no family history of memory loss. He has no history of head trauma, drug or alcohol abuse but does drink alcohol on a daily basis. He has never had a stroke. In September 2012 his MMSE was 23, clock drawing was 2, and animal fluency was 10. In January 2013 his MMSE was 22, clock drawing was 2, and AFT was 8. In September 2012 TSH was 5.26, RPR nonreactive, B12 334. MRI brain without contrast in September 2012 showed atrophy and chronic microvascular ischemia. He started having dizziness in April 2013 and noted loss of hearing. He was diagnosed with positional vertigo. In May 2013 his MMSE was 23, clock drawing was 4, animal fluency was 10.   His Past Medical History Is Significant For: Past Medical History  Diagnosis Date  . Hypertension   . Hyperlipidemia   . Chest pain, atypical   . Fatigue   . Blurred vision   . Palpitations   . Forgetfulness   . ED (erectile dysfunction)   .  History of kidney stones   . 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    His Past Surgical History Is Significant For: Past Surgical History  Procedure Laterality Date  . Colonoscopy    . 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 Cardiovascular stress test  10/08/2002    EF 63%  . Lobectomy  1979  . Cataract extraction      os 2006-od 2010    His Family History Is Significant For: Family History  Problem Relation Age of Onset  . Lung cancer Father   . Emphysema Father   . Lung cancer Brother   .  Leukemia Mother   . Heart attack Brother   . Kidney cancer Brother     His Social History Is Significant For: History   Social History  . Marital Status: Married    Spouse Name: N/A    Number of Children: 0  . Years of Education: 12   Occupational History  .     Social History Main Topics  . Smoking status: Former Smoker    Quit date: 11/28/1980  . Smokeless tobacco: Never Used  . Alcohol Use: No     Comment: 2 alcohol beverages nightly  . Drug Use: No  . Sexual Activity: None   Other Topics Concern  . None   Social History Narrative   Pt lives at home with his spouse.   Caffeine Use: 2 cups of caffeine per day.     His Allergies Are:  Allergies  Allergen Reactions  . Lisinopril Cough  . Quinolones   :  His Current Medications Are:  Outpatient Encounter Prescriptions as of 12/17/2013  Medication Sig  . aspirin 81 MG tablet Take 81 mg by mouth daily.    Marland Kitchen atorvastatin (LIPITOR) 80 MG tablet Take 0.5 tablets (40 mg total) by mouth daily.  . chlorpheniramine (CHLOR-TRIMETON) 4 MG tablet Take 4 mg by mouth 2 (two) times daily as needed for allergies.  Marland Kitchen escitalopram (LEXAPRO) 5 MG tablet TAKE 1 TABLET EACH DAY.  Marland Kitchen galantamine (RAZADYNE ER) 24 MG 24 hr capsule Take 1 capsule (24 mg total) by mouth daily with breakfast.  . hydrochlorothiazide (HYDRODIURIL) 25 MG tablet Take 0.5 tablets (12.5 mg total) by mouth daily.  Marland Kitchen levothyroxine (SYNTHROID, LEVOTHROID) 25 MCG tablet TAKE 1 TABLET ONCE DAILY EXCEPT 2 TABLETS ON WEDNESDAY AND ON SUNDAY.  Marland Kitchen lisinopril (PRINIVIL,ZESTRIL) 20 MG tablet Take 10 mg by mouth daily.    Marland Kitchen LORazepam (ATIVAN) 0.5 MG tablet Take 1 tablet (0.5 mg total) by mouth 4 (four) times daily as needed for anxiety.  Marland Kitchen NAMENDA XR 7 MG CP24 TAKE (1) CAPSULE DAILY.  . nitrofurantoin, macrocrystal-monohydrate, (MACROBID) 100 MG capsule 1/2 tablet every 6 hours x 7 days  . omeprazole (PRILOSEC) 20 MG capsule Take 20 mg by mouth at bedtime.   Marland Kitchen terazosin  (HYTRIN) 2 MG capsule Take 4 mg by mouth at bedtime.  :  Review of Systems:  Out of a complete 14 point review of systems, all are reviewed and negative with the exception of these symptoms as listed below:   Review of Systems  All other systems reviewed and are negative.   Objective:  Neurologic Exam  Physical Exam Physical Examination:   Filed Vitals:   12/17/13 1533  BP: 131/69  Pulse: 63  Temp: 97.2 F (36.2 C)    General Examination: The  patient is a very pleasant 80 y.o. male in no acute distress. He is calm and cooperative with the exam. He denies Auditory Hallucinations and Visual Hallucinations.   HEENT: Normocephalic, atraumatic, pupils are equal, round and reactive to light and accommodation. Extraocular tracking shows mild saccadic breakdown without nystagmus noted. Funduscopic exam is difficult. Hearing is impaired, and seems worse than last time. Face is symmetric with no facial masking and normal facial sensation. There is no lip, neck or jaw tremor. Neck is not rigid with intact passive ROM. There are no carotid bruits on auscultation. Oropharynx exam reveals mild mouth dryness. No significant airway crowding is noted. Mallampati is class II. Tongue protrudes centrally and palate elevates symmetrically.    Chest: is clear to auscultation without wheezing, rhonchi or crackles noted.  Heart: sounds are regular and normal without murmurs, rubs or gallops noted.   Abdomen: is soft, non-tender and non-distended with normal bowel sounds appreciated on auscultation.  Extremities: There is no pitting edema in the distal lower extremities bilaterally. Pedal pulses are intact.  Skin: is warm and dry with no trophic changes noted.  Musculoskeletal: exam reveals no obvious joint deformities, tenderness or joint swelling or erythema.  Neurologically:  Mental status: The patient is awake and alert, paying fair attention. He is able to to partially provide the history, but  most of the history is provided by [name]. He is oriented to: person, not place, but aware of the situation and day of week. His memory, attention, language and knowledge are moderately impaired. There is no aphasia, agnosia, apraxia or anomia, but he has word finding difficulty. There is a mild degree of bradyphrenia, worse from last time. Speech is mildly hypophonic with no dysarthria noted. Mood is congruent and affect is blunted and constricted.  On 07/19/12: MMSE 19/30, CDT was 2/4, AFT (Animal Fluency Test) score was 7.  On 07/21/13: MMSE was 19/30, CDT was 2/4, AFT 4/minute.  On 12/17/2013: MMSE was 13/30, CDT was 1/4, AFT 10/minute.   Cranial nerves are as described above under HEENT exam. In addition, shoulder shrug is normal with equal shoulder height noted.  Motor exam: Normal bulk, and strength for age is noted. Tone is not rigid with absence of cogwheeling in the extremities. There is overall no significant bradykinesia. There is no drift or rebound. There is no tremor. Romberg is negative. Reflexes are 1+ in the upper extremities and 1+ in the lower extremities. Toes are downgoing bilaterally. Fine motor skills: Finger taps, hand movements, and rapid alternating patting are mildly impaired bilaterally. Foot taps and foot agility are mildly impaired bilaterally.   Cerebellar testing shows no dysmetria or intention tremor on finger to nose testing. There is no truncal or gait ataxia.   Sensory exam is intact to light touch, PP and temperature in the UEs and LEs.   Gait, station and balance: He stands up from the seated position with mild difficulty and needs to push himself up. No veering to one side is noted. No leaning to one side. Posture is mildly stooped, but age appropriate. Stance is wide-based. He turns in 3 steps. Tandem walk is not possible. Balance is mildly impaired.   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 There is mild progression. He has been on Namenda XR 7 mg strength once daily in the morning and  has been tolerating it well and feels that he has improved. I would like to increase this to 14 md daily today and continue galantamine long-acting at the current strength. I prescribed Namenda XR 14 mg and renewed the Razadyne ER prescription. I provided him with written instructions as well as a hard copy of the prescriptions. I encouraged the patient to eat healthy, exercise daily and keep well hydrated, to keep a scheduled bedtime and wake time routine, to not skip any meals and eat healthy snacks in between meals and to have protein with every meal.  I did encourage him to drink more water. He indicated that he drinks one beer and one shot of liquor daily. I asked him to limit himself to just one alcoholic beverage per day.  I answered all their questions today and the patient and Michael Bradley were in agreement with the above outlined plan. Sadly, his wife passed away a couple of months ago. He seems to be coping well. I suggested a 3 month follow up with Ms. Lam and I will see the patient back after that. I encouraged them to call with any interim questions, concerns, problems or updates and refill requests.

## 2013-12-21 ENCOUNTER — Other Ambulatory Visit: Payer: Self-pay

## 2013-12-21 DIAGNOSIS — F0390 Unspecified dementia without behavioral disturbance: Secondary | ICD-10-CM

## 2013-12-21 MED ORDER — MEMANTINE HCL ER 14 MG PO CP24: 14.0000 mg | ORAL_CAPSULE | Freq: Every day | ORAL | Status: DC

## 2013-12-22 ENCOUNTER — Telehealth: Payer: Self-pay | Admitting: Nurse Practitioner

## 2013-12-22 NOTE — Telephone Encounter (Signed)
Andrell from [facility] calling to get clarification about patient's Namenda dosage increase per his after visit summary, please return call and advise.

## 2013-12-22 NOTE — Telephone Encounter (Signed)
Spoke with Nurse Silva Bandy, other nurses have gone home, read over the AVS summary and she said that they are straight concerning the namenda, and the razadyne, questions answered from what was on the AVS.

## 2013-12-29 ENCOUNTER — Telehealth: Payer: Self-pay | Admitting: Cardiology

## 2013-12-29 DIAGNOSIS — E039 Hypothyroidism, unspecified: Secondary | ICD-10-CM

## 2013-12-29 DIAGNOSIS — E7800 Pure hypercholesterolemia, unspecified: Secondary | ICD-10-CM

## 2013-12-29 DIAGNOSIS — Z79899 Other long term (current) drug therapy: Secondary | ICD-10-CM

## 2013-12-29 NOTE — Telephone Encounter (Signed)
Advised fasting labs needed

## 2013-12-29 NOTE — Telephone Encounter (Signed)
New problem    Pt want to know if he need labs during his f/u visit. Please call.

## 2013-12-30 ENCOUNTER — Other Ambulatory Visit: Payer: Self-pay | Admitting: Cardiology

## 2014-01-08 DIAGNOSIS — Z23 Encounter for immunization: Secondary | ICD-10-CM | POA: Diagnosis not present

## 2014-01-28 ENCOUNTER — Encounter: Payer: Self-pay | Admitting: Neurology

## 2014-02-02 DIAGNOSIS — L602 Onychogryphosis: Secondary | ICD-10-CM | POA: Diagnosis not present

## 2014-02-17 ENCOUNTER — Telehealth: Payer: Self-pay | Admitting: *Deleted

## 2014-02-17 NOTE — Telephone Encounter (Signed)
New Message  Pt was going to call the office back with appropriate flu shot information

## 2014-02-24 ENCOUNTER — Ambulatory Visit (INDEPENDENT_AMBULATORY_CARE_PROVIDER_SITE_OTHER): Payer: Medicare Other | Admitting: Cardiology

## 2014-02-24 ENCOUNTER — Other Ambulatory Visit (INDEPENDENT_AMBULATORY_CARE_PROVIDER_SITE_OTHER): Payer: Medicare Other | Admitting: *Deleted

## 2014-02-24 ENCOUNTER — Encounter: Payer: Self-pay | Admitting: Cardiology

## 2014-02-24 ENCOUNTER — Ambulatory Visit: Payer: Medicare Other | Admitting: Cardiology

## 2014-02-24 VITALS — BP 114/72 | HR 64 | Ht 60.0 in | Wt 160.0 lb

## 2014-02-24 DIAGNOSIS — E7800 Pure hypercholesterolemia, unspecified: Secondary | ICD-10-CM

## 2014-02-24 DIAGNOSIS — Z79899 Other long term (current) drug therapy: Secondary | ICD-10-CM | POA: Diagnosis not present

## 2014-02-24 DIAGNOSIS — I119 Hypertensive heart disease without heart failure: Secondary | ICD-10-CM

## 2014-02-24 DIAGNOSIS — F0390 Unspecified dementia without behavioral disturbance: Secondary | ICD-10-CM | POA: Diagnosis not present

## 2014-02-24 DIAGNOSIS — E039 Hypothyroidism, unspecified: Secondary | ICD-10-CM

## 2014-02-24 DIAGNOSIS — D1431 Benign neoplasm of right bronchus and lung: Secondary | ICD-10-CM

## 2014-02-24 LAB — BASIC METABOLIC PANEL
BUN: 24 mg/dL — ABNORMAL HIGH (ref 6–23)
CO2: 25 meq/L (ref 19–32)
Calcium: 9 mg/dL (ref 8.4–10.5)
Chloride: 105 mEq/L (ref 96–112)
Creatinine, Ser: 1.4 mg/dL (ref 0.4–1.5)
GFR: 52.36 mL/min — AB (ref >60.00)
GLUCOSE: 102 mg/dL — AB (ref 70–99)
POTASSIUM: 3.8 meq/L (ref 3.5–5.1)
Sodium: 138 mEq/L (ref 135–145)

## 2014-02-24 LAB — HEPATIC FUNCTION PANEL
ALT: 18 U/L (ref 0–53)
AST: 21 U/L (ref 0–37)
Albumin: 4 g/dL (ref 3.5–5.2)
Alkaline Phosphatase: 78 U/L (ref 39–117)
BILIRUBIN TOTAL: 0.7 mg/dL (ref 0.2–1.2)
Bilirubin, Direct: 0.1 mg/dL (ref 0.0–0.3)
Total Protein: 7.2 g/dL (ref 6.0–8.3)

## 2014-02-24 LAB — LIPID PANEL
Cholesterol: 145 mg/dL (ref 0–200)
HDL: 34.4 mg/dL — ABNORMAL LOW (ref >39.00)
LDL Cholesterol: 95 mg/dL (ref 0–99)
NonHDL: 110.6
Total CHOL/HDL Ratio: 4
Triglycerides: 79 mg/dL (ref 0.0–149.0)
VLDL: 15.8 mg/dL (ref 0.0–40.0)

## 2014-02-24 LAB — TSH: TSH: 2.83 u[IU]/mL (ref 0.35–4.50)

## 2014-02-24 NOTE — Patient Instructions (Signed)
Will obtain labs today and call you with the results (lp/bmet/hfp)  Your physician recommends that you continue on your current medications as directed. Please refer to the Current Medication list given to you today.  Your physician wants you to follow-up in: 6 month ov/tsh/cbc/bmet You will receive a reminder letter in the mail two months in advance. If you don't receive a letter, please call our office to schedule the follow-up appointment.

## 2014-02-24 NOTE — Assessment & Plan Note (Signed)
Blood pressure has been stable on current therapy.

## 2014-02-24 NOTE — Assessment & Plan Note (Signed)
His dementia has been stable on current therapy which is Namenda and Lexapro.

## 2014-02-24 NOTE — Assessment & Plan Note (Signed)
No cough or sputum production.  No hemoptysis.  No pleuritic pain.

## 2014-02-24 NOTE — Progress Notes (Signed)
Quick Note:  Please report to patient. The recent labs are stable. Continue same medication and careful diet. ______ 

## 2014-02-24 NOTE — Progress Notes (Signed)
Michael Bradley Date of Birth:  1925/11/09 [place] [address] [place], [place]  [address] [phone]        Fax   [phone]   History of Present Illness: This pleasant 80 year old veteran who served in the [employer] is seen for a scheduled followup office visit.  He is now a widower.  His wife died 25-Sep-2013.  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 He has a history of mild dementia. He has a past history of hypercholesterolemia and a history of hypothyroidism. Since his last visit he has had no new medical problems or complaints.  He lives in the memory care portion of [facility].   Current Outpatient Prescriptions  Medication Sig Dispense Refill  . aspirin 81 MG tablet Take 81 mg by mouth daily.      Marland Kitchen atorvastatin (LIPITOR) 80 MG tablet Take 0.5 tablets (40 mg total) by mouth daily. 45 tablet 2  . chlorpheniramine (CHLOR-TRIMETON) 4 MG tablet Take 4 mg by mouth 2 (two) times daily as needed for allergies.    Marland Kitchen escitalopram (LEXAPRO) 5 MG tablet TAKE 1 TABLET EACH DAY. 30 tablet 6  . galantamine (RAZADYNE ER) 24 MG 24 hr capsule Take 1 capsule (24 mg total) by mouth daily with breakfast. 90 capsule 3  . hydrochlorothiazide (HYDRODIURIL) 25 MG tablet Take 0.5 tablets (12.5 mg total) by mouth daily. 30 tablet 6  . levothyroxine (SYNTHROID, LEVOTHROID) 25 MCG tablet TAKE 1 TABLET ONCE DAILY EXCEPT 2 TABLETS ON WEDNESDAY AND ON SUNDAY. 39 tablet 5  . lisinopril (PRINIVIL,ZESTRIL) 20 MG tablet Take 10 mg by mouth daily.      Marland Kitchen LORazepam (ATIVAN) 0.5 MG tablet Take 1 tablet (0.5 mg total) by mouth 4 (four) times daily as needed for anxiety. 120 tablet 5  . Memantine HCl ER (NAMENDA XR) 14 MG CP24 Take 1 tablet (14 mg total) by mouth daily. 30 capsule 5  . nitrofurantoin, macrocrystal-monohydrate, (MACROBID) 100 MG capsule 1/2  tablet every 6 hours x 7 days 14 capsule 0  . omeprazole (PRILOSEC) 20 MG capsule Take 20 mg by mouth at bedtime.     Marland Kitchen terazosin (HYTRIN) 2 MG capsule Take 4 mg by mouth at bedtime.     No current facility-administered medications for this visit.    Allergies  Allergen Reactions  . Lisinopril Cough  . Quinolones     Patient Active Problem List   Diagnosis Date Noted  . Hypothyroid 08/16/2011  . Vertigo 08/16/2011  . Dementia 12/07/2010  . Hypercholesterolemia 12/07/2010  . Benign hypertensive heart disease without heart failure 12/07/2010  . 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 Dyspnea on exertion 12/07/2010    History  Smoking status  . Former Smoker  . Quit date: 11/28/1980  Smokeless tobacco  . Never Used    History  Alcohol Use No    Comment: 2 alcohol beverages nightly    Family History  Problem Relation Age of Onset  . Lung cancer Father   . Emphysema Father   . Lung cancer Brother   . Leukemia Mother   . Heart attack Brother   . Kidney cancer Brother     Review of Systems: Constitutional: no fever chills diaphoresis or fatigue or change in weight.  Head and neck: no hearing loss, no epistaxis, no photophobia or  visual disturbance. Respiratory: No cough, shortness of breath or wheezing. Cardiovascular: No chest pain peripheral edema, palpitations. Gastrointestinal: No abdominal distention, no abdominal pain, no change in bowel habits hematochezia or melena. Genitourinary: No dysuria, no frequency, no urgency, no nocturia. Musculoskeletal:No arthralgias, no back pain, no gait disturbance or myalgias. Neurological: No dizziness, no headaches, no numbness, no seizures, no syncope, no weakness, no tremors. Hematologic: No lymphadenopathy, no easy bruising. Psychiatric: No confusion, no hallucinations, no sleep disturbance.    Physical Exam: Filed Vitals:   02/24/14 0959  BP: 114/72  Pulse: 64   the general appearance reveals a well-developed  well-nourished gentleman in no distress.  He is demented.The head and neck exam reveals pupils equal and reactive.  Extraocular movements are full.  There is no scleral icterus.  The mouth and pharynx are normal.  The neck is supple.  The carotids reveal no bruits.  The jugular venous pressure is normal.  The  thyroid is not enlarged.  There is no lymphadenopathy.  The chest is clear to percussion and auscultation.  Decreased breath sounds at the right base which is chronic. There are no rales or rhonchi.  Expansion of the chest is symmetrical.  The precordium is quiet.  The first heart sound is normal.  The second heart sound is physiologically split.  There is no murmur gallop rub or click.  There is no abnormal lift or heave.  The abdomen is soft and nontender.  The bowel sounds are normal.  The liver and spleen are not enlarged.  There are no abdominal masses.  There are no abdominal bruits.  Extremities reveal good pedal pulses.  There is no phlebitis or edema.  There is no cyanosis or clubbing.  Strength is normal and symmetrical in all extremities.  There is no lateralizing weakness.  There are no sensory deficits.  The skin is warm and dry.  There is no rash.  EKG today shows normal sinus rhythm with left axis deviation and incomplete right bundle branch block and minimal voltage criteria for LVH.  Since previous tracing of 02/23/13, no significant change   Assessment / Plan: 1.  Essential hypertension 2. Hypercholesterolemia 3.  Dementia 4. Hypothyroidism 5. history of carcinoid tumor of right lung with prior surgical resection of right middle lobe and right lower lobe in 1971.  Plan: Continue same medication.  Recheck in 6 months for followup office visit CBC, TSH, and basal metabolic panel Blood work drawn today is pending.

## 2014-02-24 NOTE — Assessment & Plan Note (Signed)
The patient has a history of hypercholesterolemia.  He does not have much control over the diet that he is on at the nursing home.  We are checking lab work today.

## 2014-03-02 ENCOUNTER — Ambulatory Visit (INDEPENDENT_AMBULATORY_CARE_PROVIDER_SITE_OTHER): Payer: Medicare Other | Admitting: Ophthalmology

## 2014-03-02 DIAGNOSIS — H3531 Nonexudative age-related macular degeneration: Secondary | ICD-10-CM | POA: Diagnosis not present

## 2014-03-02 DIAGNOSIS — H35033 Hypertensive retinopathy, bilateral: Secondary | ICD-10-CM | POA: Diagnosis not present

## 2014-03-02 DIAGNOSIS — H43813 Vitreous degeneration, bilateral: Secondary | ICD-10-CM | POA: Diagnosis not present

## 2014-03-02 DIAGNOSIS — I10 Essential (primary) hypertension: Secondary | ICD-10-CM

## 2014-03-22 ENCOUNTER — Encounter: Payer: Self-pay | Admitting: Adult Health

## 2014-03-22 ENCOUNTER — Ambulatory Visit (INDEPENDENT_AMBULATORY_CARE_PROVIDER_SITE_OTHER): Payer: Medicare Other | Admitting: Adult Health

## 2014-03-22 ENCOUNTER — Ambulatory Visit: Payer: Medicare Other | Admitting: Nurse Practitioner

## 2014-03-22 VITALS — BP 114/62 | HR 72 | Ht 66.0 in | Wt 165.0 lb

## 2014-03-22 DIAGNOSIS — F0390 Unspecified dementia without behavioral disturbance: Secondary | ICD-10-CM

## 2014-03-22 NOTE — Patient Instructions (Signed)
Overall your memory has remained stable.  Continue Namenda XR, galantamine and Lexapro.  If your symptoms worsen or you develop new symptoms please let us know.

## 2014-03-22 NOTE — Progress Notes (Addendum)
PATIENT: Michael Bradley DOB: 18-Jan-1926  REASON FOR VISIT: follow up HISTORY FROM: patient  HISTORY OF PRESENT ILLNESS: Michael Bradley is a 81 year old male with a history of dementia. He returns today for follow-up. He is currently taking Namenda XR 14 mg, galantamine and Lexapro. He reports that his memory has gotten better. The patient's niece feels that his memory has remained the same. He lives at [facility]. Patient denies having to give up anything due to his memory. He is able to complete all ADLs independently. He has a little dog that he takes care of. [facility] provides all meals for the patient. He sleeps well at night. Patient reports that his mood has been "good." Denies any agitation or aggression.     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 The patient is accompanied by his wife's nephew, Michael Bradley (Michael Bradley) today. His wife unfortunately passed away 2 months ago. I last saw him on 07/21/2013, at which time I felt he had declined clinically, he felt stable and his niece reported, that he was doing well considering, he was by himself. Staff had told her, he needed little assistance. I suggested we start him on Namenda long-acting 7 mg strength and continued [place] ER. Today, he reports feeling fair. He misses his wife. He has a dog. His appetite is good. He feels, the Michael Bradley has helped. He has had no falls. He is in assisted living at [facility].03/03/2013, at which time I felt he was fairly stable. He started Lexapro in August 2014. He did ask whether she could resume driving and I felt that he was not safe to drive because of his memory loss, hearing loss, and  impaired vision. I continued his galantamine and considered adding Namenda in the near future. He resides at [facility], in assisted living. I saw him on 10/28/2012, at which time I started him on low-dose Lexapro for his depression and I suggested staying with the same dose of galantamine. We discussed initiation of Namenda in the future. His MMSE score from 07/24/2012 was 19/30, clock drawing was 2/4, animal fluency was 7 per minute. In the interim, his wife called back and reported that he stopped taking the Ativan and that he was taking Lexapro when she felt that it was working well for him. I first met him on 07/24/2012, at which time his MMSE was 19, CDT was 2/4 and AFT was 7. He started having more mood related issues recently. I continued his galantamine and suggested adding Namenda.  He previously followed with Michael Bradley and was last seen by him on 01/14/2012, at which time Michael Bradley talked to him and his wife about his driving. He now no longer drives.  He has a several (9+) year history of memory loss, initially treated at the [facility] with Aricept and then changed to [place]. There is no family history of memory loss. He has no history of head trauma, drug or alcohol abuse but does drink alcohol on a daily basis. He has never had a stroke. In September 2012 his MMSE was 23, clock drawing was 2, and animal fluency was 10. In January 2013 his MMSE was 22, clock drawing was 2, and AFT was 8. In September 2012 TSH  was 5.26, RPR nonreactive, B12 334. MRI brain without contrast in September 2012 showed atrophy and chronic microvascular ischemia. He started having dizziness in April 2013 and noted loss of hearing. He was diagnosed with positional vertigo. In May 2013 his MMSE was 23, clock drawing was 4, animal fluency was 10.   REVIEW OF SYSTEMS: Out of a complete 14 system review of symptoms, the patient complains only of the following symptoms, and all other reviewed systems are  negative.  ALLERGIES: Allergies  Allergen Reactions  . Lisinopril Cough  . Quinolones     HOME MEDICATIONS: Outpatient Prescriptions Prior to Visit  Medication Sig Dispense Refill  . aspirin 81 MG tablet Take 81 mg by mouth daily.      Marland Kitchen atorvastatin (LIPITOR) 80 MG tablet Take 0.5 tablets (40 mg total) by mouth daily. 45 tablet 2  . chlorpheniramine (CHLOR-TRIMETON) 4 MG tablet Take 4 mg by mouth 2 (two) times daily as needed for allergies.    Marland Kitchen escitalopram (LEXAPRO) 5 MG tablet TAKE 1 TABLET EACH DAY. 30 tablet 6  . galantamine (RAZADYNE ER) 24 MG 24 hr capsule Take 1 capsule (24 mg total) by mouth daily with breakfast. 90 capsule 3  . hydrochlorothiazide (HYDRODIURIL) 25 MG tablet Take 0.5 tablets (12.5 mg total) by mouth daily. 30 tablet 6  . levothyroxine (SYNTHROID, LEVOTHROID) 25 MCG tablet TAKE 1 TABLET ONCE DAILY EXCEPT 2 TABLETS ON WEDNESDAY AND ON SUNDAY. 39 tablet 5  . lisinopril (PRINIVIL,ZESTRIL) 20 MG tablet Take 10 mg by mouth daily.      Marland Kitchen LORazepam (ATIVAN) 0.5 MG tablet Take 1 tablet (0.5 mg total) by mouth 4 (four) times daily as needed for anxiety. 120 tablet 5  . Memantine HCl ER (NAMENDA XR) 14 MG CP24 Take 1 tablet (14 mg total) by mouth daily. 30 capsule 5  . nitrofurantoin, macrocrystal-monohydrate, (MACROBID) 100 MG capsule 1/2 tablet every 6 hours x 7 days 14 capsule 0  . omeprazole (PRILOSEC) 20 MG capsule Take 20 mg by mouth at bedtime.     Marland Kitchen terazosin (HYTRIN) 2 MG capsule Take 4 mg by mouth at bedtime.     No facility-administered medications prior to visit.    PAST MEDICAL HISTORY: Past Medical History  Diagnosis Date  . Hypertension   . Hyperlipidemia   . Chest pain, atypical   . Fatigue   . Blurred vision   . Palpitations   . Forgetfulness   . ED (erectile dysfunction)   . History of kidney stones   . 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    PAST SURGICAL HISTORY: Past Surgical History   Procedure Laterality Date  . Colonoscopy    . 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 Cardiovascular stress test  10/08/2002    EF 63%  . Lobectomy  1979  . Cataract extraction      os 2006-od 2010    FAMILY HISTORY: Family History  Problem Relation Age of Onset  . Lung cancer Father   . Emphysema Father   . Lung cancer Bradley   . Leukemia Mother   . Heart attack Bradley   . Kidney cancer Bradley     SOCIAL HISTORY: History   Social History  . Marital Status: Married    Spouse Name: N/A    Number of Children: 0  . Years of Education: 12   Occupational History  .  Social History Main Topics  . Smoking status: Former Smoker    Quit date: 11/28/1980  . Smokeless tobacco: Never Used  . Alcohol Use: No     Comment: 2 alcohol beverages nightly  . Drug Use: No  . Sexual Activity: Not on file   Other Topics Concern  . Not on file   Social History Narrative   Pt lives at home with his spouse.   Caffeine Use: 2 cups of caffeine per day.       PHYSICAL EXAM  Filed Vitals:   03/22/14 1126  BP: 114/62  Pulse: 72  Height: $Remove'5\' 6"'hnMAzBP$  (1.676 m)  Weight: 165 lb (74.844 kg)   Body mass index is 26.64 kg/(m^2).  Generalized: Well developed, in no acute distress   Neurological examination  Mentation: Alert. Follows all commands speech and language fluent. MMSE 15/30 Cranial nerve II-XII: Pupils were equal round reactive to light. Extraocular movements were full, visual field were full on confrontational test. Facial sensation and strength were normal. Uvula tongue midline. Head turning and shoulder shrug  were normal and symmetric. Motor: The motor testing reveals 5 over 5 strength of all 4 extremities. Good symmetric motor tone is noted throughout.  Sensory: Sensory testing is intact to soft touch on all 4 extremities. No evidence of extinction is noted.  Coordination: Cerebellar testing reveals good finger-nose-finger and heel-to-shin bilaterally.   Gait and station: Gait is normal. Tandem gait is unsteady. Romberg is negative. No drift is seen.  Reflexes: Deep tendon reflexes are symmetric and normal bilaterally.    DIAGNOSTIC DATA (LABS, IMAGING, TESTING) - I reviewed patient records, labs, notes, testing and imaging myself where available.  No results found for: WBC, HGB, HCT, MCV, PLT    Component Value Date/Time   NA 138 02/24/2014 0939   K 3.8 02/24/2014 0939   CL 105 02/24/2014 0939   CO2 25 02/24/2014 0939   GLUCOSE 102* 02/24/2014 0939   BUN 24* 02/24/2014 0939   CREATININE 1.4 02/24/2014 0939   CALCIUM 9.0 02/24/2014 0939   PROT 7.2 02/24/2014 0939   ALBUMIN 4.0 02/24/2014 0939   AST 21 02/24/2014 0939   ALT 18 02/24/2014 0939   ALKPHOS 78 02/24/2014 0939   BILITOT 0.7 02/24/2014 0939   Lab Results  Component Value Date   CHOL 145 02/24/2014   HDL 34.40* 02/24/2014   LDLCALC 95 02/24/2014   LDLDIRECT 92.0 02/23/2013   TRIG 79.0 02/24/2014   CHOLHDL 4 02/24/2014    Lab Results  Component Value Date   TSH 2.83 02/24/2014      ASSESSMENT AND PLAN 81 y.o. year old male  has a past medical history of Hypertension; Hyperlipidemia; Chest pain, atypical; Fatigue; Blurred vision; Palpitations; Forgetfulness; ED (erectile dysfunction); History of kidney stones; and Macular degeneration of both eyes. here with:  1. Dementia  Overall memory has remained stable. His MMSE today is 15/30. He should continue the Namenda, galantamine and Lexapro. No refills needed today. If his symptoms worsen or he develops new symptoms he should let us know. Otherwise he will follow-up in 6 months or sooner if needed.   Ward Givens, MSN, NP-C 03/22/2014, 11:20 AM [facility] [address], [place], [place] [address] [phone]  Note: This document was prepared with digital dictation and possible smart phrase technology. Any transcriptional errors that result from this process are  unintentional.  I reviewed the above note and documentation by the Nurse Practitioner and agree with the history, physical exam,  assessment and plan as outlined above. I was immediately available for face-to-face consultation. Star Age, MD, PhD [facility])

## 2014-03-26 ENCOUNTER — Ambulatory Visit: Payer: Medicare Other | Admitting: Neurology

## 2014-06-01 ENCOUNTER — Encounter: Payer: Self-pay | Admitting: Cardiology

## 2014-06-01 ENCOUNTER — Ambulatory Visit (INDEPENDENT_AMBULATORY_CARE_PROVIDER_SITE_OTHER): Payer: Medicare Other | Admitting: Cardiology

## 2014-06-01 VITALS — BP 100/68 | HR 64 | Ht 66.0 in | Wt 162.2 lb

## 2014-06-01 DIAGNOSIS — I119 Hypertensive heart disease without heart failure: Secondary | ICD-10-CM | POA: Diagnosis not present

## 2014-06-01 DIAGNOSIS — E7800 Pure hypercholesterolemia, unspecified: Secondary | ICD-10-CM

## 2014-06-01 DIAGNOSIS — E039 Hypothyroidism, unspecified: Secondary | ICD-10-CM

## 2014-06-01 NOTE — Progress Notes (Signed)
Cardiology Office Note   Date:  06/01/2014   ID:  Michael Bradley, DOB Sep 15, 1925, MRN [id number]  PCP:  Darlin Coco, MD  Cardiologist:   Darlin Coco, MD   No chief complaint on file.     History of Present Illness: Michael Bradley is a 81 y.o. male who presents for routine follow-up office visit.  This pleasant 81 year old veteran who served in the [employer] is seen for a scheduled followup office visit. He is now a widower. His wife died 2013/08/31. 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 He has a history of mild dementia. He has a past history of hypercholesterolemia and a history of hypothyroidism. Since his last visit he has had no new medical problems or complaints. He lives in the memory care portion of [facility].  He has been complaining of some chronic low back pain worse on the right side.  No history of trauma.  Past Medical History  Diagnosis Date  . Hypertension   . Hyperlipidemia   . Chest pain, atypical   . Fatigue   . Blurred vision   . Palpitations   . Forgetfulness   . ED (erectile dysfunction)   . History of kidney stones   . 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    Past Surgical History  Procedure Laterality Date  . Colonoscopy    . 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 Cardiovascular stress test  10/08/2002    EF 63%  . Lobectomy  1979  . Cataract extraction      os 2006-od 2010     Current Outpatient Prescriptions  Medication Sig Dispense Refill  . acetaminophen (TYLENOL) 325 MG tablet Take 325 mg by mouth as directed. 1-2 every 6 hours as needed for back pain    . aspirin 81 MG tablet Take 81 mg by mouth daily.      Marland Kitchen atorvastatin (LIPITOR) 80 MG tablet Take 0.5 tablets (40 mg total) by mouth daily. 45 tablet 2  . chlorpheniramine  (CHLOR-TRIMETON) 4 MG tablet Take 4 mg by mouth 2 (two) times daily as needed for allergies.    Marland Kitchen escitalopram (LEXAPRO) 5 MG tablet TAKE 1 TABLET EACH DAY. 30 tablet 6  . galantamine (RAZADYNE ER) 24 MG 24 hr capsule Take 1 capsule (24 mg total) by mouth daily with breakfast. 90 capsule 3  . hydrochlorothiazide (HYDRODIURIL) 25 MG tablet Take 0.5 tablets (12.5 mg total) by mouth daily. 30 tablet 6  . levothyroxine (SYNTHROID, LEVOTHROID) 25 MCG tablet TAKE 1 TABLET ONCE DAILY EXCEPT 2 TABLETS ON WEDNESDAY AND ON SUNDAY. 39 tablet 5  . lisinopril (PRINIVIL,ZESTRIL) 20 MG tablet Take 10 mg by mouth daily.      Marland Kitchen LORazepam (ATIVAN) 0.5 MG tablet Take 1 tablet (0.5 mg total) by mouth 4 (four) times daily as needed for anxiety. 120 tablet 5  . Memantine HCl ER (NAMENDA XR) 14 MG CP24 Take 1 tablet (14 mg total) by mouth daily. 30 capsule 5  . nitrofurantoin, macrocrystal-monohydrate, (MACROBID) 100 MG capsule 1/2 tablet every 6 hours x 7 days 14 capsule 0  . omeprazole (PRILOSEC) 20 MG capsule Take 20 mg by mouth at bedtime.     Marland Kitchen terazosin (HYTRIN) 2 MG capsule  Take 4 mg by mouth at bedtime.     No current facility-administered medications for this visit.    Allergies:   Lisinopril and Quinolones    Social History:  The patient  reports that he quit smoking about 33 years ago. He has never used smokeless tobacco. He reports that he does not drink alcohol or use illicit drugs.   Family History:  The patient's family history includes Emphysema in his father; Heart attack in his brother; Kidney cancer in his brother; Leukemia in his mother; Lung cancer in his brother and father.    ROS:  Please see the history of present illness.   Otherwise, review of systems are positive for none.   All other systems are reviewed and negative.    PHYSICAL EXAM: VS:  BP 100/68 mmHg  Pulse 64  Ht 5\' 6"  (1.676 m)  Wt 162 lb 3.2 oz (73.573 kg)  BMI 26.19 kg/m2  SpO2 94% , BMI Body mass index is 26.19  kg/(m^2). GEN: Well nourished, well developed, in no acute distress HEENT: normal Neck: no JVD, carotid bruits, or masses Cardiac: RRR; there is a soft systolic ejection murmur at the base Respiratory:  clear to auscultation bilaterally, normal work of breathing.  The breath sounds at the right base are diminished secondary to prior lung surgery GI: soft, nontender, nondistended, + BS MS: no deformity or atrophy Skin: warm and dry, no rash Neuro:  Strength and sensation are intact Psych: euthymic mood, full affect   EKG:  EKG is not ordered today.    Recent Labs: 02/24/2014: ALT 18; BUN 24*; Creatinine 1.4; Potassium 3.8; Sodium 138; TSH 2.83    Lipid Panel    Component Value Date/Time   CHOL 145 02/24/2014 0939   TRIG 79.0 02/24/2014 0939   HDL 34.40* 02/24/2014 0939   CHOLHDL 4 02/24/2014 0939   VLDL 15.8 02/24/2014 0939   LDLCALC 95 02/24/2014 0939   LDLDIRECT 92.0 02/23/2013 1202      Wt Readings from Last 3 Encounters:  06/01/14 162 lb 3.2 oz (73.573 kg)  03/22/14 165 lb (74.844 kg)  02/24/14 160 lb (72.576 kg)       ASSESSMENT AND PLAN:  Essential hypertension.  Blood pressure is remaining normal.  No side effects from his medicines. 2. Hypercholesterolemia.  He denies any myalgias of his legs. 3. Dementia.  Patient is in a good mood.  Dementia and does not appear to have progressed since last visit. 4. Hypothyroidism.  Clinically hypothyroid.  We checked a TSH in December and it was 5. history of carcinoid tumor of right lung with prior surgical resection of right middle lobe and right lower lobe in 1971. 6.  Chronic low back pain worse on the right.  This may be related to scar tissue from his previous lung surgery.  We will order him some generic Tylenol to use on a when necessary basis 325 mg one or 2 every 6 hours when necessary maximum of 8 per day.  Plan: Continue same medication. Recheck in 6 months for followup office visit CBC, TSH, and basal  metabolic panel Blood work drawn today is pending.   Current medicines are reviewed at length with the patient today.  The patient does not have concerns regarding medicines.  The following changes have been made:  no change  Labs/ tests ordered today include:   Orders Placed This Encounter  Procedures  . Lipid panel  . Hepatic function panel  . Basic metabolic panel  .  TSH  . CBC with Differential/Platelet     Disposition:   FU with her Brackbill6 in 6 months for office visit EKG CBC lipid panel hepatic function panel basal metabolic panel and TSH   Signed, Darlin Coco, MD  06/01/2014 4:03 PM    [facility] [address], [place], [place]  [address] Phone: [phone]; Fax: [phone]

## 2014-06-01 NOTE — Patient Instructions (Addendum)
Try Tylenol 325 mg 1-2 every 6 hours as needed for back pain, maximum of 8 tablets in 24 hours  Your physician wants you to follow-up in: 6 months with fasting labs (lp/bmet/hfp/tsh/cbc)  You will receive a reminder letter in the mail two months in advance. If you don't receive a letter, please call our office to schedule the follow-up appointment.

## 2014-06-18 DIAGNOSIS — R9389 Abnormal findings on diagnostic imaging of other specified body structures: Secondary | ICD-10-CM

## 2014-06-18 HISTORY — DX: Abnormal findings on diagnostic imaging of other specified body structures: R93.89

## 2014-06-21 ENCOUNTER — Encounter: Payer: Self-pay | Admitting: Neurology

## 2014-06-21 ENCOUNTER — Ambulatory Visit (INDEPENDENT_AMBULATORY_CARE_PROVIDER_SITE_OTHER): Payer: Medicare Other | Admitting: Neurology

## 2014-06-21 ENCOUNTER — Ambulatory Visit: Payer: Medicare Other | Admitting: Nurse Practitioner

## 2014-06-21 ENCOUNTER — Telehealth: Payer: Self-pay | Admitting: Cardiology

## 2014-06-21 VITALS — BP 124/78 | HR 57 | Temp 98.3°F | Resp 16 | Ht 66.0 in | Wt 161.0 lb

## 2014-06-21 DIAGNOSIS — Z9889 Other specified postprocedural states: Secondary | ICD-10-CM

## 2014-06-21 DIAGNOSIS — H9193 Unspecified hearing loss, bilateral: Secondary | ICD-10-CM

## 2014-06-21 DIAGNOSIS — F0390 Unspecified dementia without behavioral disturbance: Secondary | ICD-10-CM | POA: Diagnosis not present

## 2014-06-21 DIAGNOSIS — M545 Low back pain: Secondary | ICD-10-CM

## 2014-06-21 DIAGNOSIS — H3530 Unspecified macular degeneration: Secondary | ICD-10-CM | POA: Diagnosis not present

## 2014-06-21 MED ORDER — MEMANTINE HCL ER 14 MG PO CP24: 14.0000 mg | ORAL_CAPSULE | Freq: Every day | ORAL | Status: DC

## 2014-06-21 MED ORDER — GALANTAMINE HYDROBROMIDE ER 24 MG PO CP24: 24.0000 mg | ORAL_CAPSULE | Freq: Every day | ORAL | Status: DC

## 2014-06-21 NOTE — Telephone Encounter (Signed)
Will forward to  Dr. Brackbill for review 

## 2014-06-21 NOTE — Progress Notes (Signed)
Subjective:    Patient ID: Michael Bradley is a 81 y.o. male.  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 The patient is accompanied by his niece, Michael Bradley, again today. His wife unfortunately passed away in 08-Nov-2013. I last saw him on 12/17/2013, at which time he reported feeling fair. He was missing his wife. He had a dog. His appetite was good. He felt that the Namenda was helpful. He was in assisted living at [facility]. In the interim, he was seen by our nurse practitioner, Ms. Clabe Seal on 03/22/2014, at which time his MMSE was 15. He was kept on the same medications.    Today, 06/21/2014: He is able to provide some of his history. His history is supplemented by Michael Bradley. He reports no new issues. He has been able to tolerate the memory medications. He has had no falls. He does not drink enough water. He no longer drinks alcohol.    Previously:   I saw him on 07/21/2013, at which time I felt he had declined clinically, he felt stable and his niece reported, that he was doing well considering, he was by himself. Staff had told her, he needed little assistance. I suggested we start him on Namenda long-acting 7 mg strength and continued Razadyne ER.  I saw him on 03/03/2013, at which time I felt he was fairly stable. He started Lexapro in 11/08/2012. He did ask whether she could resume driving and I felt that he was not safe to drive because of his memory loss, hearing loss, and impaired vision. I continued his galantamine and considered adding Namenda in the near future. He resides at [facility], in assisted living.   I saw him on 10/28/2012, at which time I started him on low-dose  Lexapro for his depression and I suggested staying with the same dose of galantamine. We discussed initiation of Namenda in the future. His MMSE score from 07/24/2012 was 19/30, clock drawing was 2/4, animal fluency was 7 per minute. In the interim, his wife called back and reported that he stopped taking the Ativan and that he was taking Lexapro when she felt that it was working well for him.   I first met him on 07/24/2012, at which time his MMSE was 19, CDT was 2/4 and AFT was 7. He started having more mood related issues recently. I continued his galantamine and suggested adding Namenda.   He previously followed with Dr. Morene Antu and was last seen by him on 01/14/2012, at which time Dr. Erling Cruz talked to him and his wife about his driving. He now no longer drives.   He has a several (9+) year history of memory loss, initially treated at the [facility] with Aricept and then changed to Birney. There is no family history of memory loss. He has no history of head trauma, drug or alcohol abuse but does drink alcohol on a daily basis. He has never had a stroke. In September 2012 his MMSE was 23, clock drawing was 2, and animal fluency was 10. In January 2013 his MMSE was 22, clock drawing was 2, and AFT was 8. In September 2012 TSH was 5.26, RPR nonreactive, B12 334. MRI brain without contrast in September 2012  showed atrophy and chronic microvascular ischemia. He started having dizziness in April 2013 and noted loss of hearing. He was diagnosed with positional vertigo. In May 2013 his MMSE was 23, clock drawing was 4, animal fluency was 10.     His Past Medical History Is Significant For: Past Medical History  Diagnosis Date  . Hypertension   . Hyperlipidemia   . Chest pain, atypical   . Fatigue   . Blurred vision   . Palpitations   . Forgetfulness   . ED (erectile dysfunction)   . History of kidney stones   . 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     His Past Surgical History Is Significant For: Past Surgical History  Procedure Laterality Date  . Colonoscopy    . 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 Cardiovascular stress test  10/08/2002    EF 63%  . Lobectomy  1979  . Cataract extraction      os 2006-od 2010    His Family History Is Significant For: Family History  Problem Relation Age of Onset  . Lung cancer Father   . Emphysema Father   . Lung cancer Brother   . Leukemia Mother   . Heart attack Brother   . Kidney cancer Brother     His Social History Is Significant For: History   Social History  . Marital Status: Married    Spouse Name: N/A  . Number of Children: 0  . Years of Education: 12   Occupational History  .     Social History Main Topics  . Smoking status: Former Smoker    Quit date: 11/28/1980  . Smokeless tobacco: Never Used  . Alcohol Use: No  . Drug Use: No  . Sexual Activity: Not on file   Other Topics Concern  . None   Social History Narrative   Pt lives at home with his spouse.   Caffeine Use: 2 cups of caffeine per day.     His Allergies Are:  Allergies  Allergen Reactions  . Lisinopril Cough  . Quinolones   :   His Current Medications Are:  Outpatient Encounter Prescriptions as of 06/21/2014  Medication Sig  . acetaminophen (TYLENOL) 325 MG tablet Take 325 mg by mouth as directed. 1-2 every 6 hours as needed for back pain  . aspirin 81 MG tablet Take 81 mg by mouth daily.    Marland Kitchen atorvastatin (LIPITOR) 80 MG tablet Take 0.5 tablets (40 mg total) by mouth daily.  . chlorpheniramine (CHLOR-TRIMETON) 4 MG tablet Take 4 mg by mouth 2 (two) times daily as needed for allergies.  Marland Kitchen dimenhyDRINATE (DRAMAMINE) 50 MG tablet Take 50 mg by mouth every 8 (eight) hours as needed.  Marland Kitchen escitalopram (LEXAPRO) 5 MG tablet TAKE 1 TABLET EACH DAY.  Marland Kitchen galantamine (RAZADYNE ER) 24 MG 24 hr capsule Take 1 capsule (24 mg total) by mouth daily with breakfast.  .  hydrochlorothiazide (HYDRODIURIL) 25 MG tablet Take 0.5 tablets (12.5 mg total) by mouth daily.  Marland Kitchen levothyroxine (SYNTHROID, LEVOTHROID) 25 MCG tablet TAKE 1 TABLET ONCE DAILY EXCEPT 2 TABLETS ON WEDNESDAY AND ON SUNDAY.  Marland Kitchen lisinopril (PRINIVIL,ZESTRIL) 20 MG tablet Take 10 mg by mouth daily.    Marland Kitchen LORazepam (ATIVAN) 0.5 MG tablet Take 1 tablet (0.5 mg total) by mouth 4 (four) times daily as needed for anxiety.  . Memantine HCl ER (NAMENDA XR)  14 MG CP24 Take 1 tablet (14 mg total) by mouth daily.  Marland Kitchen omeprazole (PRILOSEC) 20 MG capsule Take 20 mg by mouth at bedtime.   Marland Kitchen terazosin (HYTRIN) 2 MG capsule Take 4 mg by mouth at bedtime.  . [DISCONTINUED] nitrofurantoin, macrocrystal-monohydrate, (MACROBID) 100 MG capsule 1/2 tablet every 6 hours x 7 days  :  Review of Systems:  Out of a complete 14 point review of systems, all are reviewed and negative with the exception of these symptoms as listed below:   Review of Systems  Musculoskeletal: Positive for back pain.    Objective:  Neurologic Exam  Physical Exam Physical Examination:   Filed Vitals:   06/21/14 1105  BP: 124/78  Bradley: 57  Temp: 98.3 F (36.8 C)  Resp: 16    General Examination: The patient is a very pleasant 81 y.o. male in no acute distress. He is calm and cooperative with the exam. He denies Auditory Hallucinations and Visual Hallucinations.   HEENT: Normocephalic, atraumatic, pupils are slightly unequal, round and reactive to light and accommodation. Extraocular tracking shows mild saccadic breakdown without nystagmus noted. He is s/p cataract repair. Funduscopic exam is difficult. Hearing is impaired, and seems worse than last time. Face is symmetric with no facial masking and normal facial sensation. There is no lip, neck or jaw tremor. Neck is not rigid with intact passive ROM. There are no carotid bruits on auscultation. Oropharynx exam reveals mild mouth dryness. No significant airway crowding is noted. Mallampati  is class II. Tongue protrudes centrally and palate elevates symmetrically.    Chest: is clear to auscultation without wheezing, rhonchi or crackles noted.  Heart: sounds are regular and normal without murmurs, rubs or gallops noted.   Abdomen: is soft, non-tender and non-distended with normal bowel sounds appreciated on auscultation.  Extremities: There is no pitting edema in the distal lower extremities bilaterally. Pedal pulses are intact.  Skin: is warm and dry with no trophic changes noted.  Musculoskeletal: exam reveals no obvious joint deformities, tenderness or joint swelling or erythema.  Neurologically:  Mental status: The patient is awake and alert, paying fair attention. He is able to to partially provide the history, which is supplemented by Michael Bradley. He is oriented to: person, not place, but aware of the situation and day of week. His memory, attention, language and knowledge are moderately impaired. There is no aphasia, agnosia, apraxia or anomia, but he has word finding difficulty. There is a mild degree of bradyphrenia, worse from last time. Speech is mildly hypophonic with no dysarthria noted. Mood is congruent and affect is blunted and constricted.   On 07/19/12: MMSE 19/30, CDT was 2/4, AFT (Animal Fluency Test) score was 7.  On 07/21/13: MMSE was 19/30, CDT was 2/4, AFT 4/minute.  On 12/17/2013: MMSE was 13/30, CDT was 1/4, AFT 10/minute.   On 06/21/2014: MMSE was 19/30, CDT was 3/4, AFT was 7/min.   Cranial nerves are as described above under HEENT exam. In addition, shoulder shrug is normal with equal shoulder height noted.  Motor exam: Normal bulk, and strength for age is noted. Tone is not rigid with absence of cogwheeling in the extremities. There is overall no significant bradykinesia. There is no drift or rebound. There is no tremor. Romberg is negative. Reflexes are 1+ in the upper extremities and 1+ in the lower extremities. Toes are downgoing bilaterally. Fine motor  skills: Finger taps, hand movements, and rapid alternating patting are mildly impaired bilaterally. Foot taps and foot agility are  mildly impaired bilaterally.   Cerebellar testing shows no dysmetria or intention tremor on finger to nose testing. There is no truncal or gait ataxia. Heel to shin is okay b/l.  Sensory exam is intact to light touch, PP and temperature in the UEs and LEs.   Gait, station and balance: He stands up from the seated position with mild difficulty and needs to push himself up. No veering to one side is noted. No leaning to one side. Posture is mildly stooped, but age appropriate. Stance is wide-based. He turns in 3 steps. Tandem walk is not possible. Balance is mildly impaired. He walks fairly well overall.    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 He has had stable findings recently. He has been on Namenda XR 14 mg strength once daily in the morning and has been tolerating it well. I would like to keep this and the Razadyne ER the same at this point. I provided him with written instructions. I encouraged the patient to eat healthy, exercise daily and keep well hydrated, to keep a scheduled bedtime and wake time routine, to not skip any meals and eat healthy snacks in between meals and to have protein with every meal.  I did encourage him to drink more water. He and Michael Bradley indicated that he no longer drinks alcohol. I will see him back in 6 months.  I answered all their questions today and the patient and his wife's niece were in agreement with the above outlined plan. I encouraged them to call with any interim questions, concerns, problems  or updates and refill requests.  I spent 25 minutes in total face-to-face time with the patient, more than 50% of which was spent in counseling and coordination of care, reviewing test results, reviewing medication and discussing or reviewing the diagnosis of dementia, its prognosis and treatment options.

## 2014-06-21 NOTE — Telephone Encounter (Signed)
New Message     Pt's niece calling stating that when pt last saw Dr. Mare Ferrari he was told to start taking Tylenol for the pain he was having and if it didn't help to call us back to be referred to a specialist. Pt is still experiencing pain and wants to know what Dr. Mare Ferrari wants to do. Please call back and advise.

## 2014-06-21 NOTE — Patient Instructions (Signed)
I think overall you are doing fairly well but I do want to suggest a few things today:  Remember to drink plenty of fluid, eat healthy meals and do not skip any meals. Try to eat protein with a every meal and eat a healthy snack such as fruit or nuts in between meals. Try to keep a regular sleep-wake schedule and try to exercise daily, particularly in the form of walking, 20-30 minutes a day, if you can. Good nutrition, proper sleep and exercise can help her cognitive function.  Engage in social activities in your community and with your family and try to keep up with current events by reading the newspaper or watching the news. If you have computer and can go online, try [email or web address]. Also, you may like to do word finding puzzles or crossword puzzles.  As far as your medications are concerned, I would like to suggest no changes today.   I would like to see you back in 6 months, sooner if we need to. Please call us with any interim questions, concerns, problems, updates or refill requests.  Please also call us for any test results so we can go over those with you on the phone. Richardson Landry is my clinical assistant and will answer any of your questions and relay your messages to me and also relay most of my messages to you.  Our phone number is [phone]. We also have an after hours call service for urgent matters and there is a physician on-call for urgent questions. For any emergencies you know to call 911 or go to the nearest emergency room.

## 2014-06-22 NOTE — Telephone Encounter (Signed)
We should have him get a chest xray and lumbo sacral spine xray.  He has been having low back pain worse on the right side. Prior history of lung surgery on right.  After that we will consider sending him to orthopedist if cause of pain not found.

## 2014-06-23 NOTE — Telephone Encounter (Signed)
Placed orders Left message for daughter to call back

## 2014-06-24 NOTE — Telephone Encounter (Signed)
Spoke with daughter and they will go next week for xray

## 2014-07-02 ENCOUNTER — Ambulatory Visit
Admission: RE | Admit: 2014-07-02 | Discharge: 2014-07-02 | Disposition: A | Discharge status: Home / Self Care | Payer: Medicare Other | Source: Ambulatory Visit | Attending: Cardiology | Admitting: Cardiology

## 2014-07-02 ENCOUNTER — Ambulatory Visit (INDEPENDENT_AMBULATORY_CARE_PROVIDER_SITE_OTHER): Payer: Medicare Other | Admitting: Ophthalmology

## 2014-07-02 DIAGNOSIS — H3531 Nonexudative age-related macular degeneration: Secondary | ICD-10-CM | POA: Diagnosis not present

## 2014-07-02 DIAGNOSIS — I10 Essential (primary) hypertension: Secondary | ICD-10-CM

## 2014-07-02 DIAGNOSIS — M545 Low back pain: Secondary | ICD-10-CM | POA: Diagnosis not present

## 2014-07-02 DIAGNOSIS — H35033 Hypertensive retinopathy, bilateral: Secondary | ICD-10-CM | POA: Diagnosis not present

## 2014-07-02 DIAGNOSIS — Z9889 Other specified postprocedural states: Secondary | ICD-10-CM

## 2014-07-02 DIAGNOSIS — H43813 Vitreous degeneration, bilateral: Secondary | ICD-10-CM | POA: Diagnosis not present

## 2014-07-02 DIAGNOSIS — R0602 Shortness of breath: Secondary | ICD-10-CM | POA: Diagnosis not present

## 2014-07-07 ENCOUNTER — Telehealth: Payer: Self-pay | Admitting: *Deleted

## 2014-07-07 MED ORDER — FUROSEMIDE 20 MG PO TABS: 20.0000 mg | ORAL_TABLET | Freq: Every day | ORAL | Status: AC

## 2014-07-07 NOTE — Telephone Encounter (Signed)
-----   Message from Darlin Coco, MD sent at 07/04/2014  7:55 PM EDT ----- Please report.  The chest xray shows increased interstitial change. This could be secondary to pulmonary vascular congestion. This could be causing him to be more short of breath.   I want him to stop his HCTZ and start taking lasix 20 mg daily instead. Check BMET in about a week.

## 2014-07-08 ENCOUNTER — Other Ambulatory Visit: Payer: Self-pay | Admitting: Cardiology

## 2014-07-08 NOTE — Telephone Encounter (Signed)
Advised daughter and faxed order to [facility] yesterday.  Confirmed today that it was received and they will get BMET next Thursday

## 2014-07-15 DIAGNOSIS — I119 Hypertensive heart disease without heart failure: Secondary | ICD-10-CM | POA: Diagnosis not present

## 2014-07-15 DIAGNOSIS — E780 Pure hypercholesterolemia: Secondary | ICD-10-CM | POA: Diagnosis not present

## 2014-07-15 DIAGNOSIS — E039 Hypothyroidism, unspecified: Secondary | ICD-10-CM | POA: Diagnosis not present

## 2014-07-15 DIAGNOSIS — I10 Essential (primary) hypertension: Secondary | ICD-10-CM | POA: Diagnosis not present

## 2014-07-16 ENCOUNTER — Encounter: Payer: Self-pay | Admitting: Cardiology

## 2014-07-23 DIAGNOSIS — M545 Low back pain: Secondary | ICD-10-CM | POA: Diagnosis not present

## 2014-08-06 DIAGNOSIS — M545 Low back pain: Secondary | ICD-10-CM | POA: Diagnosis not present

## 2014-08-06 DIAGNOSIS — M6281 Muscle weakness (generalized): Secondary | ICD-10-CM | POA: Diagnosis not present

## 2014-08-09 DIAGNOSIS — M6281 Muscle weakness (generalized): Secondary | ICD-10-CM | POA: Diagnosis not present

## 2014-08-09 DIAGNOSIS — M545 Low back pain: Secondary | ICD-10-CM | POA: Diagnosis not present

## 2014-08-10 DIAGNOSIS — M545 Low back pain: Secondary | ICD-10-CM | POA: Diagnosis not present

## 2014-08-10 DIAGNOSIS — M6281 Muscle weakness (generalized): Secondary | ICD-10-CM | POA: Diagnosis not present

## 2014-08-13 DIAGNOSIS — M545 Low back pain: Secondary | ICD-10-CM | POA: Diagnosis not present

## 2014-08-13 DIAGNOSIS — M6281 Muscle weakness (generalized): Secondary | ICD-10-CM | POA: Diagnosis not present

## 2014-08-17 DIAGNOSIS — M545 Low back pain: Secondary | ICD-10-CM | POA: Diagnosis not present

## 2014-08-17 DIAGNOSIS — M6281 Muscle weakness (generalized): Secondary | ICD-10-CM | POA: Diagnosis not present

## 2014-08-18 DIAGNOSIS — M545 Low back pain: Secondary | ICD-10-CM | POA: Diagnosis not present

## 2014-08-18 DIAGNOSIS — M6281 Muscle weakness (generalized): Secondary | ICD-10-CM | POA: Diagnosis not present

## 2014-08-19 DIAGNOSIS — M6281 Muscle weakness (generalized): Secondary | ICD-10-CM | POA: Diagnosis not present

## 2014-08-19 DIAGNOSIS — M545 Low back pain: Secondary | ICD-10-CM | POA: Diagnosis not present

## 2014-08-23 DIAGNOSIS — M6281 Muscle weakness (generalized): Secondary | ICD-10-CM | POA: Diagnosis not present

## 2014-08-23 DIAGNOSIS — M545 Low back pain: Secondary | ICD-10-CM | POA: Diagnosis not present

## 2014-08-25 DIAGNOSIS — M545 Low back pain: Secondary | ICD-10-CM | POA: Diagnosis not present

## 2014-08-25 DIAGNOSIS — M6281 Muscle weakness (generalized): Secondary | ICD-10-CM | POA: Diagnosis not present

## 2014-08-27 DIAGNOSIS — M545 Low back pain: Secondary | ICD-10-CM | POA: Diagnosis not present

## 2014-08-27 DIAGNOSIS — M6281 Muscle weakness (generalized): Secondary | ICD-10-CM | POA: Diagnosis not present

## 2014-08-30 DIAGNOSIS — M545 Low back pain: Secondary | ICD-10-CM | POA: Diagnosis not present

## 2014-08-30 DIAGNOSIS — M6281 Muscle weakness (generalized): Secondary | ICD-10-CM | POA: Diagnosis not present

## 2014-09-03 DIAGNOSIS — M545 Low back pain: Secondary | ICD-10-CM | POA: Diagnosis not present

## 2014-09-03 DIAGNOSIS — M6281 Muscle weakness (generalized): Secondary | ICD-10-CM | POA: Diagnosis not present

## 2014-09-13 ENCOUNTER — Other Ambulatory Visit: Payer: Self-pay

## 2014-09-27 ENCOUNTER — Other Ambulatory Visit: Payer: Self-pay | Admitting: *Deleted

## 2014-11-18 ENCOUNTER — Other Ambulatory Visit: Payer: Self-pay | Admitting: Cardiology

## 2014-12-06 ENCOUNTER — Encounter: Payer: Self-pay | Admitting: Internal Medicine

## 2014-12-13 ENCOUNTER — Encounter: Payer: Self-pay | Admitting: Neurology

## 2014-12-13 ENCOUNTER — Ambulatory Visit (INDEPENDENT_AMBULATORY_CARE_PROVIDER_SITE_OTHER): Payer: Medicare Other | Admitting: Neurology

## 2014-12-13 VITALS — BP 110/58 | HR 68 | Resp 16 | Ht 66.0 in | Wt 164.0 lb

## 2014-12-13 DIAGNOSIS — H3530 Unspecified macular degeneration: Secondary | ICD-10-CM

## 2014-12-13 DIAGNOSIS — F0390 Unspecified dementia without behavioral disturbance: Secondary | ICD-10-CM

## 2014-12-13 DIAGNOSIS — H9193 Unspecified hearing loss, bilateral: Secondary | ICD-10-CM

## 2014-12-13 MED ORDER — MEMANTINE HCL ER 21 MG PO CP24: 21.0000 mg | ORAL_CAPSULE | Freq: Every day | ORAL | Status: DC

## 2014-12-13 MED ORDER — GALANTAMINE HYDROBROMIDE ER 24 MG PO CP24: 24.0000 mg | ORAL_CAPSULE | Freq: Every day | ORAL | Status: AC

## 2014-12-13 NOTE — Patient Instructions (Signed)
We will continue with Razadyne ER 24 mg daily.  We will increase your Namenda XR to 21 mg daily.  I will see you back in 4 months.

## 2014-12-13 NOTE — Progress Notes (Signed)
Subjective:    Patient ID: Michael Bradley is a 81 y.o. male.  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 The patient is accompanied by Michael niece, Michael Bradley, again today. Michael Bradley unfortunately passed away in 2013/11/14. I last saw him on 06/21/14, at which time he reported no new issues. He was able to tolerate the memory medications. He had had no falls. He was no longer drinking alcohol. Michael MMSE was 19/30, CDT was 3/4, AFT was 7/min. I suggested he continue with long-acting Namenda 14 mg daily and Razadyne ER.   Today, 12/13/14: He reports doing okay, Michael Bradley reports that things are fairly stable. She has seen an overall slow decline in Michael cognitive function. He is not always motivated to go on activity trips, which are organized by Michael assisted living facility, [facility]. He does take Michael little dog out for walk several times a day. He has had no recent falls. Appetite seems to be good.   Previously:   I saw him on 12/17/2013, at which time he reported feeling fair. He was missing Michael Bradley. He had a dog. Michael appetite was good. He felt that the Namenda was helpful. He was in assisted living at [facility]. In the interim, he was seen by our nurse practitioner, Ms. Clabe Seal on 03/22/2014, at which time Michael MMSE was 15. He was kept on the same medications.   I saw him on 07/21/2013, at which time I felt he had declined clinically, he felt stable and Michael niece reported, that he was doing well considering, he was by himself. Staff had told her, he needed little assistance. I suggested we start him on Namenda long-acting 7 mg strength and continued Razadyne ER.  I saw him on 03/03/2013, at  which time I felt he was fairly stable. He started Lexapro in 11-14-12. He did ask whether she could resume driving and I felt that he was not safe to drive because of Michael memory loss, hearing loss, and impaired vision. I continued Michael galantamine and considered adding Namenda in the near future. He resides at [facility], in assisted living.    I saw him on 10/28/2012, at which time I started him on low-dose Lexapro for Michael depression and I suggested staying with the same dose of galantamine. We discussed initiation of Namenda in the future. Michael MMSE score from 07/24/2012 was 19/30, clock drawing was 2/4, animal fluency was 7 per minute. In the interim, Michael Bradley called back and reported that he stopped taking the Ativan and that he was taking Lexapro when she felt that it was working well for him.    I first met him on 07/24/2012, at which time Michael MMSE was 19, CDT was 2/4 and AFT was 7. He started having more mood related issues recently. I continued Michael galantamine and suggested adding Namenda.    He previously followed with Dr. Morene Antu and was last seen by him on 01/14/2012, at which time Dr. Erling Cruz talked to him and Michael Bradley about Michael driving. He now no longer drives.   He has a several (9+) year history of memory loss, initially treated at the [facility] with Aricept and then changed to [place]. There is  no family history of memory loss. He has no history of head trauma, drug or alcohol abuse but does drink alcohol on a daily basis. He has never had a stroke. In September 2012 Michael MMSE was 23, clock drawing was 2, and animal fluency was 10. In January 2013 Michael MMSE was 22, clock drawing was 2, and AFT was 8. In September 2012 TSH was 5.26, RPR nonreactive, B12 334. MRI brain without contrast in September 2012 showed atrophy and chronic microvascular ischemia. He started having dizziness in April 2013 and noted loss of hearing. He was diagnosed with positional vertigo. In May 2013 Michael MMSE was  23, clock drawing was 4, animal fluency was 10.    Michael Past Medical History Is Significant For: Past Medical History  Diagnosis Date  . Hypertension   . Hyperlipidemia   . Chest pain, atypical   . Fatigue   . Blurred vision   . Palpitations   . Forgetfulness   . ED (erectile dysfunction)   . History of kidney stones   . 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    Michael Past Surgical History Is Significant For: Past Surgical History  Procedure Laterality Date  . Colonoscopy    . 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 Cardiovascular stress test  10/08/2002    EF 63%  . Lobectomy  1979  . Cataract extraction      os 2006-od 2010    Michael Family History Is Significant For: Family History  Problem Relation Age of Onset  . Lung cancer Father   . Emphysema Father   . Lung cancer Brother   . Leukemia Mother   . Heart attack Brother   . Kidney cancer Brother     Her Social History Is Significant For: Social History   Social History  . Marital Status: Married    Spouse Name: N/A  . Number of Children: 0  . Years of Education: 12   Occupational History  .     Social History Main Topics  . Smoking status: Former Smoker    Quit date: 11/28/1980  . Smokeless tobacco: Never Used  . Alcohol Use: No  . Drug Use: No  . Sexual Activity: Not Asked   Other Topics Concern  . None   Social History Narrative   Pt lives at home with Michael spouse.   Caffeine Use: 2 cups of caffeine per day.     Michael Allergies Are:  Allergies  Allergen Reactions  . Lisinopril Cough  . Quinolones   :   Michael Current Medications Are:  Outpatient Encounter Prescriptions as of 12/13/2014  Medication Sig  . acetaminophen (TYLENOL) 325 MG tablet Take 325 mg by mouth as directed. 1-2 every 6 hours as needed for back pain  . aspirin 81 MG tablet Take 81 mg by mouth daily.    Marland Kitchen atorvastatin (LIPITOR) 80 MG tablet Take 0.5 tablets (40 mg total)  by mouth daily.  . chlorpheniramine (CHLOR-TRIMETON) 4 MG tablet Take 4 mg by mouth 2 (two) times daily as needed for allergies.  Marland Kitchen dimenhyDRINATE (DRAMAMINE) 50 MG tablet Take 50 mg by mouth every 8 (eight) hours as needed.  Marland Kitchen escitalopram (LEXAPRO) 5 MG tablet TAKE 1 TABLET EACH DAY.  . furosemide (LASIX) 20 MG tablet Take 1 tablet (20 mg total) by mouth daily.  Marland Kitchen galantamine (RAZADYNE ER) 24 MG 24 hr  capsule Take 1 capsule (24 mg total) by mouth daily with breakfast.  . levothyroxine (SYNTHROID, LEVOTHROID) 25 MCG tablet TAKE 1 TABLET ONCE DAILY EXCEPT 2 TABLETS ON WEDNESDAY AND ON SUNDAY.  Marland Kitchen lisinopril (PRINIVIL,ZESTRIL) 20 MG tablet Take 10 mg by mouth daily.    Marland Kitchen LORazepam (ATIVAN) 0.5 MG tablet Take 1 tablet (0.5 mg total) by mouth 4 (four) times daily as needed for anxiety.  . memantine (NAMENDA XR) 14 MG CP24 24 hr capsule Take 1 capsule (14 mg total) by mouth daily.  Marland Kitchen omeprazole (PRILOSEC) 20 MG capsule Take 20 mg by mouth at bedtime.   Marland Kitchen terazosin (HYTRIN) 2 MG capsule Take 4 mg by mouth at bedtime.   No facility-administered encounter medications on file as of 12/13/2014.  :  Review of Systems:  Out of a complete 14 point review of systems, all are reviewed and negative with the exception of these symptoms as listed below:   Review of Systems  Neurological:       No new concerns. Family reports that patient is doing "very well".     Objective:  Neurologic Exam  Physical Exam Physical Examination:   Filed Vitals:   12/13/14 1122  BP: 110/58  Bradley: 68  Resp: 16    General Examination: The patient is a very pleasant 81 y.o. male in no acute distress. He is calm and cooperative with the exam. He denies Auditory Hallucinations and Visual Hallucinations.   HEENT: Normocephalic, atraumatic, pupils are slightly unequal, round and reactive to light and accommodation. Extraocular tracking shows mild saccadic breakdown without nystagmus noted. He is s/p cataract repair.  Funduscopic exam is difficult. Hearing is impaired, seems stable. Face is symmetric with no facial masking and normal facial sensation. There is no lip, neck or jaw tremor. Neck is not rigid with intact passive ROM. There are no carotid bruits on auscultation. Oropharynx exam reveals mild mouth dryness. No significant airway crowding is noted. Mallampati is class II. Tongue protrudes centrally and palate elevates symmetrically.    Chest: is clear to auscultation without wheezing, rhonchi or crackles noted.  Heart: sounds are regular and normal without murmurs, rubs or gallops noted.   Abdomen: is soft, non-tender and non-distended with normal bowel sounds appreciated on auscultation.  Extremities: There is no pitting edema in the distal lower extremities bilaterally. Pedal pulses are intact.  Skin: is warm and dry with no trophic changes noted.  Musculoskeletal: exam reveals no obvious joint deformities, tenderness or joint swelling or erythema.  Neurologically:  Mental status: The patient is awake and alert, paying fair attention. He is able to to partially provide the history, which is supplemented by Michael Bradley. He is oriented to: person, not place, but aware of the situation and day of week. Michael memory, attention, language and knowledge are moderately impaired. There is no aphasia, agnosia, apraxia or anomia, but he has word finding difficulty. There is a mild degree of bradyphrenia, worse from last time. Speech is mildly hypophonic with no dysarthria noted. Mood is congruent and affect is blunted and constricted.   On 07/19/12: MMSE 19/30, CDT was 2/4, AFT (Animal Fluency Test) score was 7.  On 07/21/13: MMSE was 19/30, CDT was 2/4, AFT 4/minute.  On 12/17/2013: MMSE was 13/30, CDT was 1/4, AFT 10/minute.   On 06/21/2014: MMSE was 19/30, CDT was 3/4, AFT was 7/min.   On 12/13/2014: MMSE: 13/30, CDT: 2/4, AFT: 6/min.    Cranial nerves are as described above under HEENT exam. In addition, shoulder  shrug is normal with equal shoulder height noted.  Motor exam: Normal bulk, and strength for age is noted. Tone is not rigid with absence of cogwheeling in the extremities. There is overall no significant bradykinesia. There is no drift or rebound. There is no tremor. Romberg is negative. Reflexes are 1+ in the upper extremities and 1+ in the lower extremities. Toes are downgoing bilaterally. Fine motor skills: Finger taps, hand movements, and rapid alternating patting are mildly impaired bilaterally. Foot taps and foot agility are mildly impaired bilaterally.   Cerebellar testing shows no dysmetria or intention tremor on finger to nose testing. There is no truncal or gait ataxia.   Sensory exam is intact to light touch in the UEs and LEs.   Gait, station and balance: He stands up from the seated position with mild difficulty and needs to push himself up. No veering to one side is noted. No leaning to one side. Posture is mildly stooped, but age appropriate. Stance is wide-based. He turns in 3 steps. Tandem walk is not possible. Balance is mildly impaired. He walks fairly well overall without assistive device or assistance.     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 Michael memory scores have declined in the last few months. I would like to increase Michael Namenda to XR to 21 mg once daily and keep Michael Razadyne ER the same. He is advised to continue to try to stay active mentally and physically. He is advised to stay well-hydrated. Michael Bradley reported that when he started [place] he had some nausea for a little bit which improved with time. I have asked her to monitor for  this. I would like to see him back in about 4 months, sooner if needed. I answered all their questions today and the patient and Michael Bradley's niece were in agreement. I encouraged them to call with any interim questions, concerns, problems or updates and refill requests.  I spent 20 minutes in total face-to-face time with the patient, more than 50% of which was spent in counseling and coordination of care, reviewing test results, reviewing medication and discussing or reviewing the diagnosis of dementia, its prognosis and treatment options.

## 2014-12-17 DIAGNOSIS — Z23 Encounter for immunization: Secondary | ICD-10-CM | POA: Diagnosis not present

## 2014-12-18 DIAGNOSIS — D509 Iron deficiency anemia, unspecified: Secondary | ICD-10-CM

## 2014-12-18 HISTORY — DX: Iron deficiency anemia, unspecified: D50.9

## 2014-12-29 ENCOUNTER — Other Ambulatory Visit (INDEPENDENT_AMBULATORY_CARE_PROVIDER_SITE_OTHER): Payer: Medicare Other

## 2014-12-29 ENCOUNTER — Encounter: Payer: Self-pay | Admitting: Cardiology

## 2014-12-29 ENCOUNTER — Ambulatory Visit (INDEPENDENT_AMBULATORY_CARE_PROVIDER_SITE_OTHER): Payer: Medicare Other | Admitting: Cardiology

## 2014-12-29 VITALS — BP 140/80 | HR 55 | Ht 66.0 in | Wt 162.0 lb

## 2014-12-29 DIAGNOSIS — E7800 Pure hypercholesterolemia, unspecified: Secondary | ICD-10-CM

## 2014-12-29 DIAGNOSIS — E039 Hypothyroidism, unspecified: Secondary | ICD-10-CM

## 2014-12-29 DIAGNOSIS — D1431 Benign neoplasm of right bronchus and lung: Secondary | ICD-10-CM | POA: Diagnosis not present

## 2014-12-29 DIAGNOSIS — I119 Hypertensive heart disease without heart failure: Secondary | ICD-10-CM

## 2014-12-29 DIAGNOSIS — F0390 Unspecified dementia without behavioral disturbance: Secondary | ICD-10-CM

## 2014-12-29 LAB — CBC WITH DIFFERENTIAL/PLATELET
Basophils Absolute: 0.1 10*3/uL (ref 0.0–0.1)
Basophils Relative: 1 % (ref 0–1)
EOS PCT: 4 % (ref 0–5)
Eosinophils Absolute: 0.2 10*3/uL (ref 0.0–0.7)
HEMATOCRIT: 28.3 % — AB (ref 39.0–52.0)
HEMOGLOBIN: 9.2 g/dL — AB (ref 13.0–17.0)
LYMPHS PCT: 27 % (ref 12–46)
Lymphs Abs: 1.6 10*3/uL (ref 0.7–4.0)
MCH: 25.9 pg — AB (ref 26.0–34.0)
MCHC: 32.5 g/dL (ref 30.0–36.0)
MCV: 79.7 fL (ref 78.0–100.0)
MONO ABS: 0.9 10*3/uL (ref 0.1–1.0)
MONOS PCT: 15 % — AB (ref 3–12)
MPV: 9.1 fL (ref 8.6–12.4)
NEUTROS ABS: 3.1 10*3/uL (ref 1.7–7.7)
Neutrophils Relative %: 53 % (ref 43–77)
Platelets: 344 10*3/uL (ref 150–400)
RBC: 3.55 MIL/uL — ABNORMAL LOW (ref 4.22–5.81)
RDW: 15.6 % — AB (ref 11.5–15.5)
WBC: 5.8 10*3/uL (ref 4.0–10.5)

## 2014-12-29 LAB — LIPID PANEL
CHOL/HDL RATIO: 3.9 ratio (ref <=5.0)
Cholesterol: 129 mg/dL (ref 125–200)
HDL: 33 mg/dL — ABNORMAL LOW (ref >=40)
LDL CALC: 76 mg/dL (ref <130)
Triglycerides: 99 mg/dL (ref <150)
VLDL: 20 mg/dL (ref <30)

## 2014-12-29 LAB — BASIC METABOLIC PANEL
BUN: 22 mg/dL (ref 7–25)
CALCIUM: 9.7 mg/dL (ref 8.6–10.3)
CHLORIDE: 107 mmol/L (ref 98–110)
CO2: 26 mmol/L (ref 20–31)
Creat: 1.3 mg/dL — ABNORMAL HIGH (ref 0.70–1.11)
GLUCOSE: 98 mg/dL (ref 65–99)
Potassium: 3.9 mmol/L (ref 3.5–5.3)
SODIUM: 141 mmol/L (ref 135–146)

## 2014-12-29 LAB — HEPATIC FUNCTION PANEL
ALT: 14 U/L (ref 9–46)
AST: 21 U/L (ref 10–35)
Albumin: 4.1 g/dL (ref 3.6–5.1)
Alkaline Phosphatase: 85 U/L (ref 40–115)
Bilirubin, Direct: 0.1 mg/dL (ref <=0.2)
Indirect Bilirubin: 0.4 mg/dL (ref 0.2–1.2)
Total Bilirubin: 0.5 mg/dL (ref 0.2–1.2)
Total Protein: 7.4 g/dL (ref 6.1–8.1)

## 2014-12-29 LAB — TSH: TSH: 3.051 u[IU]/mL (ref 0.350–4.500)

## 2014-12-29 NOTE — Progress Notes (Signed)
Cardiology Office Note   Date:  12/29/2014   ID:  Michael Bradley, DOB 1925-04-15, MRN [id number]  PCP:  [name], MD  Cardiologist: Darlin Coco MD  No chief complaint on file.     History of Present Illness: Michael Bradley is a 81 y.o. male who presents for a scheduled follow-up visit  This pleasant 81 year old veteran who served in the [employer] [place] is seen for a scheduled followup office visit. He is now a widower. His wife died 2013/09/08. 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 He has a history of mild dementia. He has a past history of hypercholesterolemia and a history of hypothyroidism. Since his last visit he has had no new medical problems or complaints. He lives in the memory care portion of [facility].  He has his own apartment.  He is in the assisted living part of [facility]. He has been complaining of some chronic low back pain worse on the right side.This has responded to Tylenol.  The patient denies any chest pain or shortness of breath.  He has had a dog for 6 years and is allowed to keep a dog in his apartment.  He walks his dog every day for exercise. His PCP is Dr. Romero Liner at the [facility].  They x-rayed his hip and diagnosed arthritis of the right hip.  Past Medical History  Diagnosis Date  . Hypertension   . Hyperlipidemia   . Chest pain, atypical   . Fatigue   . Blurred vision   . Palpitations   . Forgetfulness   . ED (erectile dysfunction)   . History of kidney stones   . 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    Past Surgical History  Procedure Laterality Date  . Colonoscopy    . 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 Cardiovascular stress test  10/08/2002    EF 63%  . Lobectomy  1979  . Cataract extraction      os 2006-od 2010       Current Outpatient Prescriptions  Medication Sig Dispense Refill  . acetaminophen (TYLENOL) 325 MG tablet Take 325 mg by mouth as directed. 1-2 every 6 hours as needed for back pain    . aspirin 81 MG tablet Take 81 mg by mouth daily.      Marland Kitchen atorvastatin (LIPITOR) 80 MG tablet Take 0.5 tablets (40 mg total) by mouth daily. 45 tablet 2  . chlorpheniramine (CHLOR-TRIMETON) 4 MG tablet Take 4 mg by mouth 2 (two) times daily as needed for allergies.    Marland Kitchen dimenhyDRINATE (DRAMAMINE) 50 MG tablet Take 50 mg by mouth every 8 (eight) hours as needed.    Marland Kitchen escitalopram (LEXAPRO) 5 MG tablet Take 5 mg by mouth daily.    . furosemide (LASIX) 20 MG tablet Take 1 tablet (20 mg total) by mouth daily. 30 tablet 5  . galantamine (RAZADYNE ER) 24 MG 24 hr capsule Take 1 capsule (24 mg total) by mouth daily with breakfast. 90 capsule 3  . levothyroxine (SYNTHROID, LEVOTHROID) 25 MCG tablet Take 25 mcg by mouth. Take 1 tablet by mouth every morning except Wednesday and Sunday. Take 2 tablets by mouth on Wednesday and Sunday    . lisinopril (PRINIVIL,ZESTRIL)  20 MG tablet Take 10 mg by mouth daily.      Marland Kitchen LORazepam (ATIVAN) 0.5 MG tablet Take 1 tablet (0.5 mg total) by mouth 4 (four) times daily as needed for anxiety. 120 tablet 5  . Memantine HCl ER 21 MG CP24 Take 21 mg by mouth daily. 30 capsule 5  . omeprazole (PRILOSEC) 20 MG capsule Take 20 mg by mouth at bedtime.     Marland Kitchen terazosin (HYTRIN) 2 MG capsule Take 4 mg by mouth at bedtime.     No current facility-administered medications for this visit.    Allergies:   Lisinopril and Quinolones    Social History:  The patient  reports that he quit smoking about 34 years ago. He has never used smokeless tobacco. He reports that he does not drink alcohol or use illicit drugs.   Family History:  The patient's family history includes Emphysema in his father; Heart attack in his brother; Kidney cancer in his brother; Leukemia in his mother; Lung cancer in his  brother and father.    ROS:  Please see the history of present illness.   Otherwise, review of systems are positive for none.   All other systems are reviewed and negative.    PHYSICAL EXAM: VS:  BP 140/80 mmHg  Pulse 55  Ht 5\' 6"  (1.676 m)  Wt 162 lb (73.483 kg)  BMI 26.16 kg/m2 , BMI Body mass index is 26.16 kg/(m^2). GEN: Well nourished, well developed, in no acute distress HEENT: normal Neck: no JVD, carotid bruits, or masses Cardiac: RRR; no murmurs, rubs, or gallops,no edema  Respiratory:  clear to auscultation bilaterally, normal work of breathing.  Decreased breath sounds at the right base which is a chronic finding. GI: soft, nontender, nondistended, + BS MS: no deformity or atrophy Skin: warm and dry, no rash Neuro:  Strength and sensation are intact Psych: euthymic mood, full affect   EKG:  EKG is ordered today. The ekg ordered today demonstrates sinus bradycardia and nonspecific ST-T wave changes unchanged since 02/24/14   Recent Labs: 02/24/2014: ALT 18; BUN 24*; Creatinine, Ser 1.4; Potassium 3.8; Sodium 138; TSH 2.83    Lipid Panel    Component Value Date/Time   CHOL 145 02/24/2014 0939   TRIG 79.0 02/24/2014 0939   HDL 34.40* 02/24/2014 0939   CHOLHDL 4 02/24/2014 0939   VLDL 15.8 02/24/2014 0939   LDLCALC 95 02/24/2014 0939   LDLDIRECT 92.0 02/23/2013 1202      Wt Readings from Last 3 Encounters:  12/29/14 162 lb (73.483 kg)  12/13/14 164 lb (74.39 kg)  06/21/14 161 lb (73.029 kg)         ASSESSMENT AND PLAN:  Essential hypertension. Blood pressure is remaining normal. No side effects from his medicines. 2. Hypercholesterolemia. He denies any myalgias of his legs. 3. Dementia. Patient is in a good mood. Dementia and does not appear to have progressed since last visit. 4. Hypothyroidism. Clinically hypothyroid. We checked a TSH in December and it was normal. 5. history of carcinoid tumor of right lung with prior surgical resection of  right middle lobe and right lower lobe in 1971. 6. Chronic low back pain worse on the right. He has been told that he has arthritis of the right hip.  His discomfort has responded to low-dose acetaminophen.   Current medicines are reviewed at length with the patient today.  The patient does not have concerns regarding medicines.  The following changes have been made:  no change  Labs/ tests ordered today include:   Orders Placed This Encounter  Procedures  . EKG 12-Lead    Disposition: Continue current medication.  Lab work today is pending.  He will return here when necessary after my retirement.  He will continue to follow-up with his PCP Dr. Romero Liner at the [facility] and we will see him back on a when necessary basis.   Berna Spare MD 12/29/2014 10:41 AM    [address], [place], [place]  [address] Phone: [phone]; Fax: [phone]

## 2014-12-29 NOTE — Patient Instructions (Signed)
Medication Instructions:  ?Your physician recommends that you continue on your current medications as directed. Please refer to the Current Medication list given to you today.  ? ?Labwork: ?NONE ? ?Testing/Procedures: ?NONE ? ?Follow-Up: ?AS NEEDED  ? ?  ?

## 2015-01-03 ENCOUNTER — Ambulatory Visit (INDEPENDENT_AMBULATORY_CARE_PROVIDER_SITE_OTHER): Payer: Medicare Other | Admitting: Ophthalmology

## 2015-01-03 DIAGNOSIS — H35033 Hypertensive retinopathy, bilateral: Secondary | ICD-10-CM

## 2015-01-03 DIAGNOSIS — H43813 Vitreous degeneration, bilateral: Secondary | ICD-10-CM | POA: Diagnosis not present

## 2015-01-03 DIAGNOSIS — H353221 Exudative age-related macular degeneration, left eye, with active choroidal neovascularization: Secondary | ICD-10-CM

## 2015-01-03 DIAGNOSIS — H353114 Nonexudative age-related macular degeneration, right eye, advanced atrophic with subfoveal involvement: Secondary | ICD-10-CM

## 2015-01-03 DIAGNOSIS — I10 Essential (primary) hypertension: Secondary | ICD-10-CM | POA: Diagnosis not present

## 2015-01-12 NOTE — Telephone Encounter (Signed)
Error

## 2015-01-31 ENCOUNTER — Other Ambulatory Visit: Payer: Self-pay | Admitting: Cardiology

## 2015-02-01 NOTE — Telephone Encounter (Signed)
Please advise 

## 2015-02-04 NOTE — Telephone Encounter (Signed)
Pt's pharmacy [facility] is calling requesting a refill on pt's levothyroxine 25 mcg. 2nd request. Please advise

## 2015-04-12 ENCOUNTER — Ambulatory Visit: Payer: Medicare Other | Admitting: Neurology

## 2015-04-12 ENCOUNTER — Telehealth: Payer: Self-pay

## 2015-04-12 NOTE — Telephone Encounter (Signed)
I spoke to [name] to reschedule (DR out sick), she was unable to reschedule Serena at this moment due to driving. She will call back to reschedule. Patient can see Dr. Rexene Alberts or NP.

## 2015-04-27 ENCOUNTER — Encounter: Payer: Self-pay | Admitting: Neurology

## 2015-04-27 ENCOUNTER — Ambulatory Visit (INDEPENDENT_AMBULATORY_CARE_PROVIDER_SITE_OTHER): Payer: Medicare Other | Admitting: Neurology

## 2015-04-27 VITALS — BP 124/66 | HR 70 | Resp 16 | Ht 66.0 in | Wt 165.0 lb

## 2015-04-27 DIAGNOSIS — H9193 Unspecified hearing loss, bilateral: Secondary | ICD-10-CM

## 2015-04-27 DIAGNOSIS — F0390 Unspecified dementia without behavioral disturbance: Secondary | ICD-10-CM | POA: Diagnosis not present

## 2015-04-27 DIAGNOSIS — K148 Other diseases of tongue: Secondary | ICD-10-CM

## 2015-04-27 NOTE — Patient Instructions (Signed)
Please see the dentist at [facility] or at the [facility] for the spot on your tongue.   I think overall you are doing fairly well but I do want to suggest a few things today:  Remember to drink plenty of fluid, eat healthy meals and do not skip any meals. Try to eat protein with a every meal and eat a healthy snack such as fruit or nuts in between meals. Try to keep a regular sleep-wake schedule and try to exercise daily, particularly in the form of walking, 15-20 minutes a day, if you can. Good nutrition, proper sleep and exercise can help your memory and cognitive function.  Engage in social activities in your community and with your family and try to keep up with current events by reading the newspaper or watching the news. If you have computer and can go online, try [email or web address]. Also, you may like to do word finding puzzles or crossword puzzles.  As far as your medications are concerned, I would like to suggest: no changes.     I would like to see you back in 6 months, sooner if we need to. Please call us with any interim questions, concerns, problems, updates or refill requests.  Beverlee Nims is my nurse and will answer any of your questions and relay your messages to me and also relay my messages to you.  Our phone number is [phone]. We also have an after hours call service for urgent matters and there is a physician on-call for urgent questions. For any emergencies you know to call 911 or go to the nearest emergency room.

## 2015-04-27 NOTE — Progress Notes (Signed)
Subjective:    Patient ID: Michael Bradley is a 82 y.o. male.  HPI     Interim history:   Mr. 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 The patient is accompanied by his niece, Michael Bradley, again today. His wife passed away in Nov 16, 2013. I last saw him on 12/13/14, and which time he reported doing okay. His niece felt that things were stable. She had noticed an overall slow decline in his cognitive function. He had less motivation or initiative. He was not participating in many of the social trips that are offered through his assisted-living facility, [facility]. He does take his little dog out for a walk several times a day briefly. He had no recent falls. His MMSE was 13/30, CDT: 2/4, AFT: 6/min. I suggested we continue with his Razadyne ER but increase his Namenda long-acting from 14 mg to 21 mg daily.   Today, 04/27/2015: He reports feeling reasonably well. Michael Bradley has no new concerns. He saw Dr. Mare Ferrari in cardiology in October 2016 and was advised to follow-up as needed. He sees Dr. Romero Liner at the [facility]. His weight has remained stable, there has been no new fall or recent illness thankfully. He continues to tolerate his memory medications.mood is stable.  Previously:   I saw him on 06/21/14, at which time he reported no new issues. He was able to tolerate the memory medications. He had had no falls. He was no longer drinking alcohol. His MMSE was 19/30, CDT was 3/4, AFT was 7/min. I suggested he continue with long-acting Namenda 14 mg daily and Razadyne ER.   I saw him on 12/17/2013, at which time he reported feeling fair. He was missing his wife. He had a dog. His appetite was good. He felt that the Namenda was helpful. He was  in assisted living at [facility]. In the interim, he was seen by our nurse practitioner, Ms. Clabe Seal on 03/22/2014, at which time his MMSE was 15. He was kept on the same medications.   I saw him on 07/21/2013, at which time I felt he had declined clinically, he felt stable and his niece reported, that he was doing well considering, he was by himself. Staff had told her, he needed little assistance. I suggested we start him on Namenda long-acting 7 mg strength and continued Razadyne ER.  I saw him on 03/03/2013, at which time I felt he was fairly stable. He started Lexapro in 11/16/2012. He did ask whether she could resume driving and I felt that he was not safe to drive because of his memory loss, hearing loss, and impaired vision. I continued his galantamine and considered adding Namenda in the near future. He resides at [facility], in assisted living.    I saw him on 10/28/2012, at which time I started him on low-dose Lexapro for his depression and I suggested staying with the same dose of galantamine. We discussed initiation of Namenda in the future. His MMSE score from 07/24/2012 was 19/30, clock drawing was 2/4, animal fluency was 7 per minute. In the interim, his wife called back and reported that he stopped taking the Ativan and that he was taking Lexapro when she felt that it was working well for him.    I first met him on 07/24/2012,  at which time his MMSE was 19, CDT was 2/4 and AFT was 7. He started having more mood related issues recently. I continued his galantamine and suggested adding Namenda.    He previously followed with Dr. Morene Antu and was last seen by him on 01/14/2012, at which time Dr. Erling Cruz talked to him and his wife about his driving. He now no longer drives.   He has a several (9+) year history of memory loss, initially treated at the [facility] with Aricept and then changed to [place]. There is no family history of memory loss. He has no history of head trauma,  drug or alcohol abuse but does drink alcohol on a daily basis. He has never had a stroke. In September 2012 his MMSE was 23, clock drawing was 2, and animal fluency was 10. In January 2013 his MMSE was 22, clock drawing was 2, and AFT was 8. In September 2012 TSH was 5.26, RPR nonreactive, B12 334. MRI brain without contrast in September 2012 showed atrophy and chronic microvascular ischemia. He started having dizziness in April 2013 and noted loss of hearing. He was diagnosed with positional vertigo. In May 2013 his MMSE was 23, clock drawing was 4, animal fluency was 10.    His Past Medical History Is Significant For: Past Medical History  Diagnosis Date  . Hypertension   . Hyperlipidemia   . Chest pain, atypical   . Fatigue   . Blurred vision   . Palpitations   . Forgetfulness   . ED (erectile dysfunction)   . History of kidney stones   . 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    His Past Surgical History Is Significant For: Past Surgical History  Procedure Laterality Date  . Colonoscopy    . 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 Cardiovascular stress test  10/08/2002    EF 63%  . Lobectomy  1979  . Cataract extraction      os 2006-od 2010    His Family History Is Significant For: Family History  Problem Relation Age of Onset  . Lung cancer Father   . Emphysema Father   . Lung cancer Brother   . Leukemia Mother   . Heart attack Brother   . Kidney cancer Brother     His Social History Is Significant For: Social History   Social History  . Marital Status: Married    Spouse Name: N/A  . Number of Children: 0  . Years of Education: 12   Occupational History  .     Social History Main Topics  . Smoking status: Former Smoker    Quit date: 11/28/1980  . Smokeless tobacco: Never Used  . Alcohol Use: No  . Drug Use: No  . Sexual Activity: Not Asked   Other Topics Concern  . None   Social History  Narrative   Pt lives at home with his spouse.   Caffeine Use: 2 cups of caffeine per day.     His Allergies Are:  Allergies  Allergen Reactions  . Lisinopril Cough  . Quinolones Other (See Comments)    Pt doesn't recall  :   His Current Medications Are:  Outpatient Encounter Prescriptions as of 04/27/2015  Medication Sig  . acetaminophen (TYLENOL) 325 MG tablet Take 325 mg by mouth as directed. 1-2 every 6 hours as needed for back pain  . aspirin  81 MG tablet Take 81 mg by mouth daily.    Marland Kitchen atorvastatin (LIPITOR) 80 MG tablet Take 0.5 tablets (40 mg total) by mouth daily.  . chlorpheniramine (CHLOR-TRIMETON) 4 MG tablet Take 4 mg by mouth 2 (two) times daily as needed for allergies.  Marland Kitchen dimenhyDRINATE (DRAMAMINE) 50 MG tablet Take 50 mg by mouth every 8 (eight) hours as needed.  Marland Kitchen escitalopram (LEXAPRO) 5 MG tablet Take 5 mg by mouth daily.  . furosemide (LASIX) 20 MG tablet Take 1 tablet (20 mg total) by mouth daily.  Marland Kitchen galantamine (RAZADYNE ER) 24 MG 24 hr capsule Take 1 capsule (24 mg total) by mouth daily with breakfast.  . levothyroxine (SYNTHROID, LEVOTHROID) 25 MCG tablet TAKE 1 TABLET ONCE DAILY EXCEPT 2 TABLETS ON WEDNESDAY AND ON SUNDAY.  Marland Kitchen lisinopril (PRINIVIL,ZESTRIL) 20 MG tablet Take 10 mg by mouth daily.    Marland Kitchen LORazepam (ATIVAN) 0.5 MG tablet Take 1 tablet (0.5 mg total) by mouth 4 (four) times daily as needed for anxiety.  . Memantine HCl ER 21 MG CP24 Take 21 mg by mouth daily.  Marland Kitchen omeprazole (PRILOSEC) 20 MG capsule Take 20 mg by mouth at bedtime.   Marland Kitchen terazosin (HYTRIN) 2 MG capsule Take 4 mg by mouth at bedtime.   No facility-administered encounter medications on file as of 04/27/2015.  :  Review of Systems:  Out of a complete 14 point review of systems, all are reviewed and negative with the exception of these symptoms as listed below:Review of Systems  Neurological:       No new concerns per patient.      Objective:  Neurologic Exam  Physical Exam Physical  Examination:   Filed Vitals:   04/27/15 1502  BP: 124/66  Bradley: 70  Resp: 16    General Examination: The patient is a very pleasant 82 y.o. male in no acute distress. He is calm and cooperative with the exam. He is in fairly good spirits today. He is a [place] [employer] veteran and wears his [place] veteran hat.   HEENT: Normocephalic, atraumatic, pupils are slightly unequal, round and reactive to light and accommodation. Extraocular tracking shows mild saccadic breakdown without nystagmus noted. He is s/p cataract repair. Funduscopic exam is difficult. Hearing is impaired, seems stable. Face is symmetric with no facial masking and normal facial sensation. There is no lip, neck or jaw tremor. Neck is not rigid with intact passive ROM. There are no carotid bruits on auscultation. Oropharynx exam reveals mild mouth dryness. No significant airway crowding is noted. He has a small, raised, hyperpigmented lesion center of his tongue in the frontal part. This is not painful, no bleeding, appears new. Mallampati is class II. Tongue protrudes centrally and palate elevates symmetrically.    Chest: is clear to auscultation without wheezing, rhonchi or crackles noted.  Heart: sounds are regular and normal without murmurs, rubs or gallops noted.   Abdomen: is soft, non-tender and non-distended with normal bowel sounds appreciated on auscultation.  Extremities: There is no pitting edema in the distal lower extremities bilaterally. Pedal pulses are intact.  Skin: is warm and dry with no trophic changes noted.  Musculoskeletal: exam reveals no obvious joint deformities, tenderness or joint swelling or erythema.  Neurologically:  Mental status: The patient is awake and alert, paying fair attention. He is able to partially provide the history, which is supplemented by Michael Bradley. He is oriented to: person, not place, but aware of the situation and day of week. His memory, attention,  language and knowledge are  moderately impaired. There is no aphasia, agnosia, apraxia or anomia, but he has word finding difficulty. There is a mild degree of bradyphrenia, worse from last time. Speech is mildly hypophonic with no dysarthria noted. Mood is congruent and affect is normal today.   On 07/19/12: MMSE 19/30, CDT was 2/4, AFT (Animal Fluency Test) score was 7.  On 07/21/13: MMSE was 19/30, CDT was 2/4, AFT 4/minute.  On 12/17/2013: MMSE was 13/30, CDT was 1/4, AFT 10/minute.   On 06/21/2014: MMSE was 19/30, CDT was 3/4, AFT was 7/min.   On 12/13/2014: MMSE: 13/30, CDT: 2/4, AFT: 6/min.    On 04/27/2015: MMSE: 14/30, CDT: 1/4, AFT: 5/min.  Cranial nerves are as described above under HEENT exam. In addition, shoulder shrug is normal with equal shoulder height noted.  Motor exam: Normal bulk, and strength for age is noted. Tone is not rigid with absence of cogwheeling in the extremities. There is overall no significant bradykinesia. There is no drift or rebound. There is no tremor. Romberg is negative, but he does stand slightly wide-based for this. Reflexes are 1+ in the upper extremities and 1+ in the lower extremities. Fine motor skills: Finger taps, hand movements, and rapid alternating patting are mildly impaired bilaterally. Foot taps and foot agility are mildly impaired bilaterally.   Cerebellar testing shows no dysmetria or intention tremor on finger to nose testing. There is no truncal or gait ataxia.   Sensory exam is intact to light touch in the UEs and LEs.   Gait, station and balance: He stands up from the seated position with mild difficulty and needs to push himself up. No veering to one side is noted. No leaning to one side. Posture is mildly stooped, but age appropriate. Stance is wide-based. He turns in 3 steps. Tandem walk is not possible. Balance is mildly impaired. He walks fairly well overall without assistive device or assistance.     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 His memory scores have declined with time, as would be expected, but stable for the most part from last time. I suggested we continue with the same dose of Razadyne ER, 24 mg once daily and Namenda long-acting 21 mg once daily. He is advised to continue to try to stay active mentally and physically. He is advised to stay well-hydrated. He walks his dog regularly but is also reminded that sometimes smaller dogs can be a risk for tripping. Nevertheless, he has had no recent problems. For his tongue lesion, Michael Bradley is advised to make sure he has an appointment with his dentist. He can see a dentist at his assisted living facility from what I understand or they can try to make an appointment through the [place] I believe. I would like to see him back in 6 months, sooner if needed. I answered all their questions today and the patient and his wife's niece were in agreement. I encouraged them to call with any interim questions, concerns, problems or updates and refill requests.  I spent 25 minutes in total face-to-face time with the patient, more than 50% of which was spent in counseling and coordination of care, reviewing test results, reviewing medication and discussing  or reviewing the diagnosis of dementia, its prognosis and treatment options.

## 2015-05-08 DIAGNOSIS — Z23 Encounter for immunization: Secondary | ICD-10-CM | POA: Diagnosis not present

## 2015-06-02 ENCOUNTER — Inpatient Hospital Stay (HOSPITAL_COMMUNITY)
Admission: EM | Admit: 2015-06-02 | Discharge: 2015-06-05 | DRG: 377 | Disposition: A | Discharge status: Home Health | Payer: Medicare Other | Attending: Internal Medicine | Admitting: Internal Medicine

## 2015-06-02 ENCOUNTER — Encounter (HOSPITAL_COMMUNITY): Payer: Self-pay | Admitting: Emergency Medicine

## 2015-06-02 ENCOUNTER — Emergency Department (HOSPITAL_COMMUNITY): Payer: Medicare Other

## 2015-06-02 DIAGNOSIS — K219 Gastro-esophageal reflux disease without esophagitis: Secondary | ICD-10-CM | POA: Diagnosis present

## 2015-06-02 DIAGNOSIS — I119 Hypertensive heart disease without heart failure: Secondary | ICD-10-CM | POA: Diagnosis not present

## 2015-06-02 DIAGNOSIS — Z888 Allergy status to other drugs, medicaments and biological substances status: Secondary | ICD-10-CM

## 2015-06-02 DIAGNOSIS — R195 Other fecal abnormalities: Secondary | ICD-10-CM | POA: Diagnosis present

## 2015-06-02 DIAGNOSIS — Z8051 Family history of malignant neoplasm of kidney: Secondary | ICD-10-CM

## 2015-06-02 DIAGNOSIS — R4182 Altered mental status, unspecified: Secondary | ICD-10-CM | POA: Diagnosis not present

## 2015-06-02 DIAGNOSIS — K922 Gastrointestinal hemorrhage, unspecified: Principal | ICD-10-CM | POA: Diagnosis present

## 2015-06-02 DIAGNOSIS — D500 Iron deficiency anemia secondary to blood loss (chronic): Secondary | ICD-10-CM | POA: Diagnosis not present

## 2015-06-02 DIAGNOSIS — G9340 Encephalopathy, unspecified: Secondary | ICD-10-CM | POA: Diagnosis not present

## 2015-06-02 DIAGNOSIS — H3530 Unspecified macular degeneration: Secondary | ICD-10-CM | POA: Diagnosis present

## 2015-06-02 DIAGNOSIS — Z881 Allergy status to other antibiotic agents status: Secondary | ICD-10-CM

## 2015-06-02 DIAGNOSIS — N390 Urinary tract infection, site not specified: Secondary | ICD-10-CM | POA: Diagnosis not present

## 2015-06-02 DIAGNOSIS — E785 Hyperlipidemia, unspecified: Secondary | ICD-10-CM | POA: Diagnosis present

## 2015-06-02 DIAGNOSIS — Z7982 Long term (current) use of aspirin: Secondary | ICD-10-CM

## 2015-06-02 DIAGNOSIS — Z8249 Family history of ischemic heart disease and other diseases of the circulatory system: Secondary | ICD-10-CM

## 2015-06-02 DIAGNOSIS — K5730 Diverticulosis of large intestine without perforation or abscess without bleeding: Secondary | ICD-10-CM | POA: Diagnosis present

## 2015-06-02 DIAGNOSIS — E538 Deficiency of other specified B group vitamins: Secondary | ICD-10-CM | POA: Diagnosis present

## 2015-06-02 DIAGNOSIS — B351 Tinea unguium: Secondary | ICD-10-CM | POA: Diagnosis not present

## 2015-06-02 DIAGNOSIS — Z806 Family history of leukemia: Secondary | ICD-10-CM

## 2015-06-02 DIAGNOSIS — Z87442 Personal history of urinary calculi: Secondary | ICD-10-CM

## 2015-06-02 DIAGNOSIS — Z825 Family history of asthma and other chronic lower respiratory diseases: Secondary | ICD-10-CM

## 2015-06-02 DIAGNOSIS — F0390 Unspecified dementia without behavioral disturbance: Secondary | ICD-10-CM | POA: Diagnosis present

## 2015-06-02 DIAGNOSIS — N183 Chronic kidney disease, stage 3 (moderate): Secondary | ICD-10-CM | POA: Diagnosis present

## 2015-06-02 DIAGNOSIS — Z66 Do not resuscitate: Secondary | ICD-10-CM | POA: Diagnosis present

## 2015-06-02 DIAGNOSIS — E039 Hypothyroidism, unspecified: Secondary | ICD-10-CM | POA: Diagnosis present

## 2015-06-02 DIAGNOSIS — Z79899 Other long term (current) drug therapy: Secondary | ICD-10-CM

## 2015-06-02 DIAGNOSIS — I129 Hypertensive chronic kidney disease with stage 1 through stage 4 chronic kidney disease, or unspecified chronic kidney disease: Secondary | ICD-10-CM | POA: Diagnosis present

## 2015-06-02 DIAGNOSIS — Z87891 Personal history of nicotine dependence: Secondary | ICD-10-CM

## 2015-06-02 DIAGNOSIS — R410 Disorientation, unspecified: Secondary | ICD-10-CM | POA: Diagnosis present

## 2015-06-02 DIAGNOSIS — Z801 Family history of malignant neoplasm of trachea, bronchus and lung: Secondary | ICD-10-CM

## 2015-06-02 HISTORY — DX: Iron deficiency anemia, unspecified: D50.9

## 2015-06-02 HISTORY — DX: Hypothyroidism, unspecified: E03.9

## 2015-06-02 HISTORY — DX: Unspecified dementia without behavioral disturbance: F03.90

## 2015-06-02 HISTORY — DX: Abnormal findings on diagnostic imaging of other specified body structures: R93.89

## 2015-06-02 LAB — COMPREHENSIVE METABOLIC PANEL
ALT: 15 U/L — AB (ref 17–63)
ANION GAP: 8 (ref 5–15)
AST: 21 U/L (ref 15–41)
Albumin: 4.2 g/dL (ref 3.5–5.0)
Alkaline Phosphatase: 78 U/L (ref 38–126)
BUN: 21 mg/dL — ABNORMAL HIGH (ref 6–20)
CALCIUM: 9 mg/dL (ref 8.9–10.3)
CHLORIDE: 113 mmol/L — AB (ref 101–111)
CO2: 24 mmol/L (ref 22–32)
Creatinine, Ser: 1.24 mg/dL (ref 0.61–1.24)
GFR calc non Af Amer: 50 mL/min — ABNORMAL LOW (ref >60)
GFR, EST AFRICAN AMERICAN: 58 mL/min — AB (ref >60)
GLUCOSE: 105 mg/dL — AB (ref 65–99)
Potassium: 4.2 mmol/L (ref 3.5–5.1)
Sodium: 145 mmol/L (ref 135–145)
Total Bilirubin: 0.5 mg/dL (ref 0.3–1.2)
Total Protein: 7.5 g/dL (ref 6.5–8.1)

## 2015-06-02 LAB — CBC
HCT: 26.8 % — ABNORMAL LOW (ref 39.0–52.0)
HEMOGLOBIN: 7.7 g/dL — AB (ref 13.0–17.0)
MCH: 22.1 pg — AB (ref 26.0–34.0)
MCHC: 28.7 g/dL — AB (ref 30.0–36.0)
MCV: 77 fL — AB (ref 78.0–100.0)
Platelets: 345 10*3/uL (ref 150–400)
RBC: 3.48 MIL/uL — AB (ref 4.22–5.81)
RDW: 17 % — ABNORMAL HIGH (ref 11.5–15.5)
WBC: 7.7 10*3/uL (ref 4.0–10.5)

## 2015-06-02 LAB — URINE MICROSCOPIC-ADD ON

## 2015-06-02 LAB — URINALYSIS, ROUTINE W REFLEX MICROSCOPIC
Bilirubin Urine: NEGATIVE
GLUCOSE, UA: NEGATIVE mg/dL
HGB URINE DIPSTICK: NEGATIVE
Ketones, ur: NEGATIVE mg/dL
Nitrite: NEGATIVE
PH: 6 (ref 5.0–8.0)
Protein, ur: NEGATIVE mg/dL
SPECIFIC GRAVITY, URINE: 1.013 (ref 1.005–1.030)

## 2015-06-02 LAB — RETICULOCYTES
RBC.: 3.37 MIL/uL — AB (ref 4.22–5.81)
RETIC COUNT ABSOLUTE: 43.8 10*3/uL (ref 19.0–186.0)
RETIC CT PCT: 1.3 % (ref 0.4–3.1)

## 2015-06-02 LAB — CBG MONITORING, ED: Glucose-Capillary: 95 mg/dL (ref 65–99)

## 2015-06-02 LAB — BRAIN NATRIURETIC PEPTIDE: B Natriuretic Peptide: 158.5 pg/mL — ABNORMAL HIGH (ref 0.0–100.0)

## 2015-06-02 LAB — POC OCCULT BLOOD, ED: FECAL OCCULT BLD: POSITIVE — AB

## 2015-06-02 LAB — ABO/RH: ABO/RH(D): O POS

## 2015-06-02 MED ORDER — ATORVASTATIN CALCIUM 40 MG PO TABS
40.0000 mg | ORAL_TABLET | Freq: Every day | ORAL | Status: DC
Start: 2015-06-03 — End: 2015-06-05
  Administered 2015-06-03 – 2015-06-04 (×2): 40 mg via ORAL
  Filled 2015-06-02 (×3): qty 1

## 2015-06-02 MED ORDER — LISINOPRIL 10 MG PO TABS
10.0000 mg | ORAL_TABLET | Freq: Every day | ORAL | Status: DC
  Administered 2015-06-03 – 2015-06-05 (×2): 10 mg via ORAL
  Filled 2015-06-02 (×3): qty 1

## 2015-06-02 MED ORDER — FUROSEMIDE 20 MG PO TABS
20.0000 mg | ORAL_TABLET | Freq: Every day | ORAL | Status: DC
  Administered 2015-06-05: 20 mg via ORAL
  Filled 2015-06-02 (×2): qty 1

## 2015-06-02 MED ORDER — HALOPERIDOL LACTATE 5 MG/ML IJ SOLN
2.0000 mg | Freq: Once | INTRAMUSCULAR | Status: AC
  Administered 2015-06-02: 2 mg via INTRAVENOUS
  Filled 2015-06-02: qty 1

## 2015-06-02 MED ORDER — SODIUM CHLORIDE 0.9 % IV SOLN
INTRAVENOUS | Status: AC
  Administered 2015-06-02: 23:00:00 via INTRAVENOUS

## 2015-06-02 MED ORDER — ESCITALOPRAM OXALATE 5 MG PO TABS
5.0000 mg | ORAL_TABLET | Freq: Every day | ORAL | Status: DC
  Administered 2015-06-03 – 2015-06-05 (×2): 5 mg via ORAL
  Filled 2015-06-02 (×3): qty 1

## 2015-06-02 MED ORDER — SODIUM CHLORIDE 0.9 % IV BOLUS (SEPSIS)
500.0000 mL | Freq: Once | INTRAVENOUS | Status: AC
  Administered 2015-06-02: 500 mL via INTRAVENOUS

## 2015-06-02 MED ORDER — LEVOTHYROXINE SODIUM 25 MCG PO TABS
25.0000 ug | ORAL_TABLET | Freq: Every day | ORAL | Status: DC
  Administered 2015-06-03 – 2015-06-05 (×2): 25 ug via ORAL
  Filled 2015-06-02 (×4): qty 1

## 2015-06-02 MED ORDER — GALANTAMINE HYDROBROMIDE 4 MG PO TABS
12.0000 mg | ORAL_TABLET | Freq: Two times a day (BID) | ORAL | Status: DC
  Administered 2015-06-02 – 2015-06-05 (×5): 12 mg via ORAL
  Filled 2015-06-02 (×8): qty 3

## 2015-06-02 MED ORDER — ACETAMINOPHEN 325 MG PO TABS
325.0000 mg | ORAL_TABLET | Freq: Four times a day (QID) | ORAL | Status: DC | PRN
  Administered 2015-06-04: 650 mg via ORAL
  Filled 2015-06-02: qty 2

## 2015-06-02 MED ORDER — TERAZOSIN HCL 2 MG PO CAPS
4.0000 mg | ORAL_CAPSULE | Freq: Every day | ORAL | Status: DC
  Administered 2015-06-02 – 2015-06-04 (×3): 4 mg via ORAL
  Filled 2015-06-02 (×4): qty 2

## 2015-06-02 MED ORDER — SULFAMETHOXAZOLE-TRIMETHOPRIM 800-160 MG PO TABS
1.0000 | ORAL_TABLET | Freq: Two times a day (BID) | ORAL | Status: DC
  Administered 2015-06-02 – 2015-06-03 (×2): 1 via ORAL
  Filled 2015-06-02 (×3): qty 1

## 2015-06-02 MED ORDER — DEXTROSE 5 % IV SOLN
1.0000 g | Freq: Once | INTRAVENOUS | Status: AC
  Administered 2015-06-02: 1 g via INTRAVENOUS
  Filled 2015-06-02: qty 10

## 2015-06-02 MED ORDER — ASPIRIN EC 81 MG PO TBEC
81.0000 mg | DELAYED_RELEASE_TABLET | Freq: Every day | ORAL | Status: DC
  Administered 2015-06-03: 81 mg via ORAL
  Filled 2015-06-02: qty 1

## 2015-06-02 MED ORDER — PANTOPRAZOLE SODIUM 40 MG PO TBEC
40.0000 mg | DELAYED_RELEASE_TABLET | Freq: Every day | ORAL | Status: DC
  Administered 2015-06-03 – 2015-06-05 (×2): 40 mg via ORAL
  Filled 2015-06-02 (×3): qty 1

## 2015-06-02 NOTE — ED Notes (Signed)
Pt has been refusing to be admitted, has gotten dressed and trying to leave. Pt believes that he has the right to leave, but is unable to state to the Dr. why he is here.  Pt's POA has expressed her wishes to have him admitted. Dr. Loleta Books has filed for IVC paperwork.

## 2015-06-02 NOTE — H&P (Signed)
History and Physical  Patient Name: Michael Bradley     [id number]    DOB: 1925-12-02    DOA: 06/02/2015 Referring physician: Shary Decamp, PA-C PCP: Praveen Danes, MD      Chief Complaint: Shary Decamp, PA-C  HPI: Michael Bradley is a 83 y.o. male with a past medical history significant for dementia, hypothyroidism, and HTN who presents with anemia and confusion.  The patient was sent over from [place] today because of altered mental status.  He had no cough, vomiting, diarrhea, hematochezia, melena, hematemesis, or fever, but seemed disoriented and uncooperative.  In the ED, he was afebrile and hemodynamically stable. Chest x-ray showed chronic stable bilateral interstitial opacities without focal consolidation, urinalysis showed full field WBCs and bacteria. Incidentally he was noted to have hemoglobin 7.7 (without patient endorsed or nursing home reported source of bleeding). He is not on a blood thinner, takes a baby aspirin but no other NSAIDs. He has no history of GI bleeding. His last colonoscopy was in 2009, and he had hyperplastic polyps.  Ceftriaxone and fluids were administered, and TRH were asked to evaluate for admission.   Review of Systems:  Pt complains of nothing. Pt denies any fever, chills, abdominal pain, hematochezia, melena, change in appetite, dysuria, hematuria, cough, congestion, dyspnea.  All other systems negative except as just noted or noted in the history of present illness.  Allergies  Allergen Reactions  . Lisinopril Cough  . Quinolones Other (See Comments)    Pt doesn't recall    Prior to Admission medications   Medication Sig Start Date End Date Taking? Authorizing Provider  acetaminophen (TYLENOL) 325 MG tablet Take 325-650 mg by mouth every 6 (six) hours as needed for mild pain, moderate pain or headache.    Yes Historical Provider, MD  aspirin 81 MG tablet Take 81 mg by mouth daily.     Yes Historical Provider, MD  atorvastatin  (LIPITOR) 80 MG tablet Take 0.5 tablets (40 mg total) by mouth daily. 05/15/13  Yes Darlin Coco, MD  chlorpheniramine (CHLOR-TRIMETON) 4 MG tablet Take 4 mg by mouth 2 (two) times daily as needed for allergies.   Yes Historical Provider, MD  escitalopram (LEXAPRO) 5 MG tablet Take 5 mg by mouth daily.   Yes Historical Provider, MD  furosemide (LASIX) 20 MG tablet Take 1 tablet (20 mg total) by mouth daily. 07/07/14  Yes Darlin Coco, MD  galantamine (RAZADYNE ER) 24 MG 24 hr capsule Take 1 capsule (24 mg total) by mouth daily with breakfast. 12/13/14  Yes Star Age, MD  levothyroxine (SYNTHROID, LEVOTHROID) 25 MCG tablet TAKE 1 TABLET ONCE DAILY EXCEPT 2 TABLETS ON WEDNESDAY AND ON SUNDAY. 02/04/15  Yes Darlin Coco, MD  lisinopril (PRINIVIL,ZESTRIL) 20 MG tablet Take 10 mg by mouth daily.     Yes Historical Provider, MD  omeprazole (PRILOSEC) 20 MG capsule Take 20 mg by mouth at bedtime.    Yes Historical Provider, MD  terazosin (HYTRIN) 2 MG capsule Take 4 mg by mouth at bedtime. 02/04/13  Yes Darlin Coco, MD  LORazepam (ATIVAN) 0.5 MG tablet Take 1 tablet (0.5 mg total) by mouth 4 (four) times daily as needed for anxiety. Patient not taking: Reported on 06/02/2015 04/08/13   Darlin Coco, MD  Memantine HCl ER 21 MG CP24 Take 21 mg by mouth daily. Patient not taking: Reported on 06/02/2015 12/13/14   Star Age, MD    Past Medical History  Diagnosis Date  . Hypertension   .  Hyperlipidemia   . Chest pain, atypical   . Fatigue   . Blurred vision   . Palpitations   . Forgetfulness   . ED (erectile dysfunction)   . History of kidney stones   . 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    Past Surgical History  Procedure Laterality Date  . Colonoscopy    . 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 Cardiovascular stress test  10/08/2002    EF 63%  . Lobectomy  1979  . Cataract extraction      os 2006-od 2010     Family history: family history includes Emphysema in his father; Heart attack in his brother; Kidney cancer in his brother; Leukemia in his mother; Lung cancer in his brother and father.  Social History: Patient lives In assisted living. He does not use cane or a walker. He is a remote smoker. He has mild dementia at baseline per her nephew and niece/POA, without behavioral disturbance. His POA is his niece [name]. He has no children. He is formerly a Furniture conservator/restorer, he is from [place].       Physical Exam: BP 124/61 mmHg  Pulse 61  Temp(Src) 98.2 F (36.8 C) (Oral)  Resp 14  SpO2 92% General appearance: Thin elderly adult male, agitated and irritable.   Eyes: Anicteric, conjunctiva pink, lids and lashes normal.     ENT: No nasal deformity, discharge, or epistaxis.  OP moist without lesions.   Lymph: No cervical or supraclavicular lymphadenopathy. Skin: Warm and dry.  Pale. Cardiac: RRR, nl S1-S2, no murmurs appreciated.  Capillary refill is brisk.  JVP normal.  No LE edema.   Respiratory: Normal respiratory rate and rhythm.  CTAB without rales or wheezes. Abdomen: Abdomen soft without rigidity.  No TTP. No ascites, distension.   MSK: No deformities or effusions. Rectal: No large external hemorrhoids or gross bleeding. Neuro: Cranial nerves grossly intact.  Patient aggravated about having to stay in hospital.  1/3 three item recall at 5 minutes.  Unable to explain general facts of current condition, despite having been told several times. Unable to articulate potential harms of leaving hospital tonight.  Speech is fluent.  Moves all extremities equally and with normal coordination.   Gait shuffling, but normal. Psych: Affect agitated.  Fixed delusion that EDP "poked a hold in my intestines" with the rectal exam.       Labs on Admission:  The metabolic panel shows normal electrolytes, CKD stage III. Transaminases and bilirubin are normal. Urinalysis shows pyuria and bacteria. FOBT  positive. The complete blood count shows no leukocytosis or thrombocytopenia. The hemoglobin is 7.7 g/dL down from 9.2 g/dL last October   Radiological Exams on Admission: Personally reviewed: Dg Chest 2 View 06/02/2015   Elevated hemidiaphragm from old surgery for carcinoid tumor. Chronic interstitial markings are doubted to be fluid, in the clinical context.      Assessment/Plan 1. Blood loss anemia:  This is new.  Last colonoscopy was 2009 and just hyperplastic polyps. Currently has microcytosis and elevated RDW, likely chronic blood loss (no acute loss noted by patient or ALF or nephew). In October of last year he had a microcytic anemia 9 g/dL, follow-up was recommended, but does not appear to have happened. -Check iron studies, ferritin -Check reticulocytes, B12 and folate as well -Repeat CBC in AM, transfuse if Hgb < 7 g/dL -Consult to GI, appreciate cares  and recommendations, clear liquid diet for now, no prep (but anticipate colonoscopy during this hospitalization perhaps)   2. Delirium:  This is new.  Likely from #3 superimposed on #5.  The patient is currently not able to repeat why he is in the hospital, nor to express the potential benefits or harms of leaving the hospital without planned assessment of his anemia.  The patient's POA is activated, who is his niece [name]. -Haldol IV 2 mg PRN for agitation -IVC for one day  3. UTI:  -Ceftriaxone given once. -Transition to oral Bactrim DS BID for 7 days -IV fluids 1L now then MIVF  4. Hypothyroidism:  -Continue home levothyroxine  5. Dementia:  -Continue home galantamine, escitalopram  6. HTN:  Somewhat hypertensive at admission -Continue home terazosin, lisinopril, statin -Hold furosemide until Saturday, then ordered to restart -Continue home aspirin 81 mg   7. GERD:  -Continue home PPI     DVT PPx: SCDs Diet: Clears  Consultants: Gastroenterology Code Status: DO NOT RESUSCITATE Family Communication:  Duane Boston, present at bedside. He was in communication with patient's niece/POA [name], and reported they both felt it was in the patient's best interests to stay for evaluation.  Differential and planned treatments overnight discussed. CODE STATUS confirmed. Medical decision making: What exists of the patient's previous chart was reviewed in depth and the case was discussed with Jeris Penta and Dr. Havery Moros from GI. Patient seen 9:12 PM on 06/02/2015.  Disposition Plan:  I recommend admission to medical surgical bed.  Clinical condition: stable at present.  Anticipate observation overnight, treatment of UTI with antibiotics and fluids, evaluation of anemia, likely colonoscopy while inpatient, per POA wishes and GI discussion.      Edwin Dada [facility] Pager [phone]   Addendum: Damaris Schooner with [name] by phone, who noted that patient likely would want to have colonoscopy if recommended, but would wish to be DNR. She will be present at the hospital tomorrow.

## 2015-06-02 NOTE — ED Notes (Signed)
Patient presents with nephew for altered mental status. Patient from [facility] Sent from UC to rule out UTI. Patient denies urinary symptoms, cough, SOB, CP, lightheadedness, dizziness, N/V/D. Staff at facility reports patient has been more confused that normal. Patient alert and oriented to self only. Ambulatory with steady gait.

## 2015-06-02 NOTE — ED Notes (Signed)
Pt ambualted in hallway unassisted with sats at 92%

## 2015-06-02 NOTE — ED Provider Notes (Signed)
CSN: [id number]     Arrival date & time 06/02/15  1602 History   First MD Initiated Contact with Patient 06/02/15 1744     Chief Complaint  Patient presents with  . Altered Mental Status   (Consider location/radiation/quality/duration/timing/severity/associated sxs/prior Treatment) HPI 82 y.o. male with a hx of Dementia, HTN, HLD, presents to the Emergency Department today from [facility] due to increased confusion as reported by nursing staff. Requested evaluation for UTI. Pt lives in apartment alone, but has people check on him. Does not endorse any dysuria, hematuria. No CP/SOB/ABD pain. No N/V/D. No fevers. Pt is ambulatory without assistance. Normal PO intake without difficulty. According to nephew, who is also in the room, he does not see any increase in confusion, but is here due to [facility] request. No other symptoms noted.   Past Medical History  Diagnosis Date  . Hypertension   . Hyperlipidemia   . Chest pain, atypical   . Fatigue   . Blurred vision   . Palpitations   . Forgetfulness   . ED (erectile dysfunction)   . History of kidney stones   . 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   Past Surgical History  Procedure Laterality Date  . Colonoscopy    . 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 Cardiovascular stress test  10/08/2002    EF 63%  . Lobectomy  1979  . Cataract extraction      os 2006-od 2010   Family History  Problem Relation Age of Onset  . Lung cancer Father   . Emphysema Father   . Lung cancer Brother   . Leukemia Mother   . Heart attack Brother   . Kidney cancer Brother    Social History  Substance Use Topics  . Smoking status: Former Smoker    Quit date: 11/28/1980  . Smokeless tobacco: Never Used  . Alcohol Use: No    Review of Systems ROS reviewed and all are negative for acute change except as noted in the HPI.  Allergies  Lisinopril and Quinolones  Home  Medications   Prior to Admission medications   Medication Sig Start Date End Date Taking? Authorizing Provider  acetaminophen (TYLENOL) 325 MG tablet Take 325 mg by mouth as directed. 1-2 every 6 hours as needed for back pain    Historical Provider, MD  aspirin 81 MG tablet Take 81 mg by mouth daily.      Historical Provider, MD  atorvastatin (LIPITOR) 80 MG tablet Take 0.5 tablets (40 mg total) by mouth daily. 05/15/13   Darlin Coco, MD  chlorpheniramine (CHLOR-TRIMETON) 4 MG tablet Take 4 mg by mouth 2 (two) times daily as needed for allergies.    Historical Provider, MD  dimenhyDRINATE (DRAMAMINE) 50 MG tablet Take 50 mg by mouth every 8 (eight) hours as needed.    Historical Provider, MD  escitalopram (LEXAPRO) 5 MG tablet Take 5 mg by mouth daily.    Historical Provider, MD  furosemide (LASIX) 20 MG tablet Take 1 tablet (20 mg total) by mouth daily. 07/07/14   Darlin Coco, MD  galantamine (RAZADYNE ER) 24 MG 24 hr capsule Take 1 capsule (24 mg total) by mouth daily with breakfast. 12/13/14   Star Age, MD  levothyroxine (SYNTHROID, LEVOTHROID) 25 MCG tablet TAKE 1 TABLET ONCE DAILY EXCEPT 2 TABLETS ON WEDNESDAY AND ON SUNDAY. 02/04/15   Darlin Coco, MD  lisinopril (PRINIVIL,ZESTRIL) 20 MG tablet Take 10 mg by mouth daily.      Historical Provider, MD  LORazepam (ATIVAN) 0.5 MG tablet Take 1 tablet (0.5 mg total) by mouth 4 (four) times daily as needed for anxiety. 04/08/13   Darlin Coco, MD  Memantine HCl ER 21 MG CP24 Take 21 mg by mouth daily. 12/13/14   Star Age, MD  omeprazole (PRILOSEC) 20 MG capsule Take 20 mg by mouth at bedtime.     Historical Provider, MD  terazosin (HYTRIN) 2 MG capsule Take 4 mg by mouth at bedtime. 02/04/13   Darlin Coco, MD   BP 125/69 mmHg  Pulse 67  Temp(Src) 98.2 F (36.8 C) (Oral)  Resp 18  SpO2 96%   Physical Exam  Constitutional: He is oriented to person, place, and time. He appears well-developed and well-nourished.  HENT:   Head: Normocephalic and atraumatic.  Eyes: EOM are normal. Pupils are equal, round, and reactive to light.  Neck: Normal range of motion. Neck supple. No tracheal deviation present.  Cardiovascular: Normal rate, regular rhythm and normal heart sounds.   Pulmonary/Chest: Effort normal and breath sounds normal. No respiratory distress. He has no wheezes. He has no rales. He exhibits no tenderness.  Abdominal: Soft. Bowel sounds are normal. There is no tenderness.  Genitourinary: Rectal exam shows no external hemorrhoid, no fissure and no tenderness. Guaiac positive stool.  Chaperone present  Musculoskeletal: Normal range of motion.  Pt able to ambulate without difficulty. No gait abnormalities.   Neurological: He is alert and oriented to person, place, and time. He has normal strength. No cranial nerve deficit or sensory deficit.  Cranial Nerves:  II: Pupils equal, round, reactive to light III,IV, VI: ptosis not present, extra-ocular motions intact bilaterally  V,VII: smile symmetric, facial light touch sensation equal VIII: hearing grossly normal bilaterally  IX,X: midline uvula rise  XI: bilateral shoulder shrug equal and strong XII: midline tongue extension  Skin: Skin is warm and dry.  Psychiatric: He has a normal mood and affect. His behavior is normal. Thought content normal.  Nursing note and vitals reviewed.  ED Course  Procedures (including critical care time) Labs Review Labs Reviewed  COMPREHENSIVE METABOLIC PANEL - Abnormal; Notable for the following:    Chloride 113 (*)    Glucose, Bld 105 (*)    BUN 21 (*)    ALT 15 (*)    GFR calc non Af Amer 50 (*)    GFR calc Af Amer 58 (*)    All other components within normal limits  CBC - Abnormal; Notable for the following:    RBC 3.48 (*)    Hemoglobin 7.7 (*)    HCT 26.8 (*)    MCV 77.0 (*)    MCH 22.1 (*)    MCHC 28.7 (*)    RDW 17.0 (*)    All other components within normal limits  URINALYSIS, ROUTINE W REFLEX  MICROSCOPIC (NOT AT [facility]) - Abnormal; Notable for the following:    APPearance CLOUDY (*)    Leukocytes, UA MODERATE (*)    All other components within normal limits  URINE MICROSCOPIC-ADD ON - Abnormal; Notable for the following:    Squamous Epithelial / LPF 0-5 (*)    Bacteria, UA MANY (*)    All other components within normal limits  POC OCCULT BLOOD, ED - Abnormal; Notable for the following:    Fecal Occult Bld POSITIVE (*)    All other components within normal limits  URINE  CULTURE  OCCULT BLOOD X 1 CARD TO LAB, STOOL  BRAIN NATRIURETIC PEPTIDE  CBG MONITORING, ED  TYPE AND SCREEN   Imaging Review No results found. I have personally reviewed and evaluated these images and lab results as part of my medical decision-making.   EKG Interpretation None      MDM  I have reviewed and evaluated the relevant laboratory values.I have reviewed and evaluated the relevant imaging studies.I personally evaluated and interpreted the relevant EKG.I have reviewed the relevant previous healthcare records.I have reviewed EMS Documentation.I obtained HPI from historian. Patient discussed with supervising physician  ED Course:  Assessment: Pt is a 42yM with hx Dementia, HTN, HLD who presents from [facility] due to AMS. On exam, pt in NAD. Nontoxic/nonseptic appearing. VSS. Afebrile. Lungs CTA. Heart RRR. Abdomen nontender soft. Pt able to ambulate alone without difficulty. Cn evaluated and unremarkable. Motor/sensory intact. UA positive for UTI. No WBC. Incidental CBC showed Hgb 7.7 with previous being 9.2 five months ago. Does not endorse any hematochezia. No hemorrhoids. No pain with BM. Occult stool positive. Does not have GI doctor. Unsure of last colonoscopy. Type and screen obtained. Plan is to admit to medicine for further management.   Disposition/Plan:  Admit Pt acknowledges and agrees with plan  Supervising Physician Julianne Rice, MD   Final diagnoses:  UTI (lower urinary  tract infection)  Gastrointestinal hemorrhage, unspecified gastritis, unspecified gastrointestinal hemorrhage type     Shary Decamp, PA-C 06/02/15 2031  Julianne Rice, MD 06/02/15 2332

## 2015-06-03 ENCOUNTER — Encounter (HOSPITAL_COMMUNITY): Payer: Self-pay | Admitting: Physician Assistant

## 2015-06-03 DIAGNOSIS — I129 Hypertensive chronic kidney disease with stage 1 through stage 4 chronic kidney disease, or unspecified chronic kidney disease: Secondary | ICD-10-CM | POA: Diagnosis present

## 2015-06-03 DIAGNOSIS — Z801 Family history of malignant neoplasm of trachea, bronchus and lung: Secondary | ICD-10-CM | POA: Diagnosis not present

## 2015-06-03 DIAGNOSIS — K5730 Diverticulosis of large intestine without perforation or abscess without bleeding: Secondary | ICD-10-CM | POA: Diagnosis not present

## 2015-06-03 DIAGNOSIS — K319 Disease of stomach and duodenum, unspecified: Secondary | ICD-10-CM | POA: Diagnosis not present

## 2015-06-03 DIAGNOSIS — Z825 Family history of asthma and other chronic lower respiratory diseases: Secondary | ICD-10-CM | POA: Diagnosis not present

## 2015-06-03 DIAGNOSIS — I119 Hypertensive heart disease without heart failure: Secondary | ICD-10-CM | POA: Diagnosis not present

## 2015-06-03 DIAGNOSIS — Z79899 Other long term (current) drug therapy: Secondary | ICD-10-CM | POA: Diagnosis not present

## 2015-06-03 DIAGNOSIS — F0391 Unspecified dementia with behavioral disturbance: Secondary | ICD-10-CM | POA: Diagnosis not present

## 2015-06-03 DIAGNOSIS — Z8051 Family history of malignant neoplasm of kidney: Secondary | ICD-10-CM | POA: Diagnosis not present

## 2015-06-03 DIAGNOSIS — R195 Other fecal abnormalities: Secondary | ICD-10-CM | POA: Diagnosis present

## 2015-06-03 DIAGNOSIS — K922 Gastrointestinal hemorrhage, unspecified: Secondary | ICD-10-CM | POA: Diagnosis not present

## 2015-06-03 DIAGNOSIS — H3530 Unspecified macular degeneration: Secondary | ICD-10-CM | POA: Diagnosis present

## 2015-06-03 DIAGNOSIS — Z8249 Family history of ischemic heart disease and other diseases of the circulatory system: Secondary | ICD-10-CM | POA: Diagnosis not present

## 2015-06-03 DIAGNOSIS — D509 Iron deficiency anemia, unspecified: Secondary | ICD-10-CM

## 2015-06-03 DIAGNOSIS — K219 Gastro-esophageal reflux disease without esophagitis: Secondary | ICD-10-CM | POA: Diagnosis present

## 2015-06-03 DIAGNOSIS — D500 Iron deficiency anemia secondary to blood loss (chronic): Secondary | ICD-10-CM | POA: Diagnosis not present

## 2015-06-03 DIAGNOSIS — E039 Hypothyroidism, unspecified: Secondary | ICD-10-CM

## 2015-06-03 DIAGNOSIS — E785 Hyperlipidemia, unspecified: Secondary | ICD-10-CM | POA: Diagnosis present

## 2015-06-03 DIAGNOSIS — Z87891 Personal history of nicotine dependence: Secondary | ICD-10-CM | POA: Diagnosis not present

## 2015-06-03 DIAGNOSIS — Z7982 Long term (current) use of aspirin: Secondary | ICD-10-CM | POA: Diagnosis not present

## 2015-06-03 DIAGNOSIS — R410 Disorientation, unspecified: Secondary | ICD-10-CM | POA: Diagnosis not present

## 2015-06-03 DIAGNOSIS — Z66 Do not resuscitate: Secondary | ICD-10-CM | POA: Diagnosis present

## 2015-06-03 DIAGNOSIS — Z888 Allergy status to other drugs, medicaments and biological substances status: Secondary | ICD-10-CM | POA: Diagnosis not present

## 2015-06-03 DIAGNOSIS — G9340 Encephalopathy, unspecified: Secondary | ICD-10-CM | POA: Diagnosis present

## 2015-06-03 DIAGNOSIS — Z806 Family history of leukemia: Secondary | ICD-10-CM | POA: Diagnosis not present

## 2015-06-03 DIAGNOSIS — N183 Chronic kidney disease, stage 3 (moderate): Secondary | ICD-10-CM | POA: Diagnosis present

## 2015-06-03 DIAGNOSIS — R4182 Altered mental status, unspecified: Secondary | ICD-10-CM | POA: Diagnosis not present

## 2015-06-03 DIAGNOSIS — N390 Urinary tract infection, site not specified: Secondary | ICD-10-CM

## 2015-06-03 DIAGNOSIS — Z881 Allergy status to other antibiotic agents status: Secondary | ICD-10-CM | POA: Diagnosis not present

## 2015-06-03 DIAGNOSIS — Z87442 Personal history of urinary calculi: Secondary | ICD-10-CM | POA: Diagnosis not present

## 2015-06-03 DIAGNOSIS — F0390 Unspecified dementia without behavioral disturbance: Secondary | ICD-10-CM | POA: Diagnosis present

## 2015-06-03 DIAGNOSIS — E538 Deficiency of other specified B group vitamins: Secondary | ICD-10-CM | POA: Diagnosis present

## 2015-06-03 LAB — CBC
HCT: 26 % — ABNORMAL LOW (ref 39.0–52.0)
HCT: 29.6 % — ABNORMAL LOW (ref 39.0–52.0)
HEMATOCRIT: 23.1 % — AB (ref 39.0–52.0)
HEMOGLOBIN: 7.8 g/dL — AB (ref 13.0–17.0)
Hemoglobin: 6.6 g/dL — CL (ref 13.0–17.0)
Hemoglobin: 9.2 g/dL — ABNORMAL LOW (ref 13.0–17.0)
MCH: 21.6 pg — ABNORMAL LOW (ref 26.0–34.0)
MCH: 22.4 pg — ABNORMAL LOW (ref 26.0–34.0)
MCH: 23.4 pg — ABNORMAL LOW (ref 26.0–34.0)
MCHC: 28.6 g/dL — AB (ref 30.0–36.0)
MCHC: 30 g/dL (ref 30.0–36.0)
MCHC: 31.1 g/dL (ref 30.0–36.0)
MCV: 75.5 fL — ABNORMAL LOW (ref 78.0–100.0)
MCV: 74.7 fL — ABNORMAL LOW (ref 78.0–100.0)
MCV: 75.1 fL — ABNORMAL LOW (ref 78.0–100.0)
PLATELETS: 291 10*3/uL (ref 150–400)
PLATELETS: 292 10*3/uL (ref 150–400)
Platelets: 294 10*3/uL (ref 150–400)
RBC: 3.06 MIL/uL — ABNORMAL LOW (ref 4.22–5.81)
RBC: 3.48 MIL/uL — ABNORMAL LOW (ref 4.22–5.81)
RBC: 3.94 MIL/uL — ABNORMAL LOW (ref 4.22–5.81)
RDW: 17 % — AB (ref 11.5–15.5)
RDW: 17.4 % — AB (ref 11.5–15.5)
RDW: 17.6 % — AB (ref 11.5–15.5)
WBC: 7.2 10*3/uL (ref 4.0–10.5)
WBC: 6.6 10*3/uL (ref 4.0–10.5)
WBC: 8.3 10*3/uL (ref 4.0–10.5)

## 2015-06-03 LAB — IRON AND TIBC
IRON: 13 ug/dL — AB (ref 45–182)
Saturation Ratios: 3 % — ABNORMAL LOW (ref 17.9–39.5)
TIBC: 391 ug/dL (ref 250–450)
UIBC: 378 ug/dL

## 2015-06-03 LAB — BASIC METABOLIC PANEL
Anion gap: 7 (ref 5–15)
BUN: 19 mg/dL (ref 6–20)
CALCIUM: 8.4 mg/dL — AB (ref 8.9–10.3)
CO2: 23 mmol/L (ref 22–32)
CREATININE: 1.17 mg/dL (ref 0.61–1.24)
Chloride: 113 mmol/L — ABNORMAL HIGH (ref 101–111)
GFR calc non Af Amer: 54 mL/min — ABNORMAL LOW (ref >60)
Glucose, Bld: 88 mg/dL (ref 65–99)
Potassium: 3.8 mmol/L (ref 3.5–5.1)
SODIUM: 143 mmol/L (ref 135–145)

## 2015-06-03 LAB — VITAMIN B12: VITAMIN B 12: 169 pg/mL — AB (ref 180–914)

## 2015-06-03 LAB — FERRITIN: Ferritin: 6 ng/mL — ABNORMAL LOW (ref 24–336)

## 2015-06-03 LAB — TSH: TSH: 3.493 u[IU]/mL (ref 0.350–4.500)

## 2015-06-03 LAB — PREPARE RBC (CROSSMATCH)

## 2015-06-03 MED ORDER — CEFTRIAXONE SODIUM 1 G IJ SOLR
1.0000 g | Freq: Once | INTRAMUSCULAR | Status: AC
  Administered 2015-06-03: 1 g via INTRAVENOUS
  Filled 2015-06-03: qty 10

## 2015-06-03 MED ORDER — SODIUM CHLORIDE 0.9 % IV SOLN: Freq: Once | INTRAVENOUS | Status: DC

## 2015-06-03 MED ORDER — PEG-KCL-NACL-NASULF-NA ASC-C 100 G PO SOLR
0.5000 | Freq: Once | ORAL | Status: AC
  Administered 2015-06-04: 100 g via ORAL
  Filled 2015-06-03: qty 1

## 2015-06-03 MED ORDER — PEG-KCL-NACL-NASULF-NA ASC-C 100 G PO SOLR
0.5000 | Freq: Once | ORAL | Status: AC
  Administered 2015-06-03: 100 g via ORAL
  Filled 2015-06-03: qty 1

## 2015-06-03 MED ORDER — PEG-KCL-NACL-NASULF-NA ASC-C 100 G PO SOLR: 1.0000 | Freq: Once | ORAL | Status: DC

## 2015-06-03 MED ORDER — CYANOCOBALAMIN 1000 MCG/ML IJ SOLN
1000.0000 ug | Freq: Every day | INTRAMUSCULAR | Status: DC
  Administered 2015-06-03 – 2015-06-05 (×2): 1000 ug via INTRAMUSCULAR
  Filled 2015-06-03 (×3): qty 1

## 2015-06-03 NOTE — Care Management Obs Status (Signed)
[place] NOTIFICATION   Patient Details  Name: Michael Bradley MRN: [id number] Date of Birth: September 29, 1925   Medicare Observation Status Notification Given:  Yes    Erenest Rasher, RN 06/03/2015, 10:15 AM

## 2015-06-03 NOTE — Progress Notes (Signed)
Paged T.Rogue Bussing, MD notify [facility] 6.6.

## 2015-06-03 NOTE — Clinical Social Work Note (Signed)
Clinical Social Work Assessment  Patient Details  Name: Michael Bradley MRN: [id number] Date of Birth: 12/07/1925  Date of referral:  06/03/15               Reason for consult:  Facility Placement, Discharge Planning                Permission sought to share information with:  Facility Contact Representative Permission granted to share information::  Yes, Verbal Permission Granted  Name::        Agency::     Relationship::     Contact Information:     Housing/Transportation Living arrangements for the past 2 months:  Assisted Living Facility Source of Information:  Facility, Other (Comment Required) (niece) Patient Interpreter Needed:  None Criminal Activity/Legal Involvement Pertinent to Current Situation/Hospitalization:  No - Comment as needed Significant Relationships:  Other Family Members Lives with:  Facility Resident Do you feel safe going back to the place where you live?  Yes Need for family participation in patient care:  Yes (Comment)  Care giving concerns:  No concerns reported at this.   Social Worker assessment / plan:  Pt hospitalized on 06/02/15 with an UTI. Pt is from [facility]. He is oriented X 1. Pt is under IVC due to refusing needed medical treatment. CSW spoke met with pt today to offer support / reassurance. CSW spoke with pt's niece, Michael Bradley [phone]  to assist with d/c planning.  Niece would like pt to return to ALF at d/c. ALF contacted and clinicals sent for review. Med Tech reports that pt can return this weekend, if ready for d/c, provided is at baseline and tere are no changes in pt's care. CSW will continue to follow to assist with d/c planning.  Employment status:  Retired Insurance information:  Medicare PT Recommendations:  Not assessed at this time Information / Referral to community resources:     Patient/Family's Response to care:  Niece would like pt to return to [facility] at d/c.  Patient/Family's Understanding of  and Emotional Response to Diagnosis, Current Treatment, and Prognosis:  Niece is aware of pt's medical status. Pt is much more cooperative today. No attempts to leave hospital today.  Emotional Assessment Appearance:  Appears stated age Attitude/Demeanor/Rapport:  Other (cooperative) Affect (typically observed):  Calm, Appropriate, Pleasant Orientation:  Oriented to Self Alcohol / Substance use:  Not Applicable Psych involvement (Current and /or in the community):  No (Comment)  Discharge Needs  Concerns to be addressed:  Discharge Planning Concerns Readmission within the last 30 days:  No Current discharge risk:  None Barriers to Discharge:  No Barriers Identified   ,  Lee, LCSW [phone] 06/03/2015, 1:10 PM  

## 2015-06-03 NOTE — Care Management Note (Signed)
Case Management Note  Patient Details  Name: Michael Bradley MRN: [id number] Date of Birth: [birth year]  Subjective/Objective:    GI bleed                Action/Plan: Discharge Planning: NCM spoke to pt and gave permission to speak to [facility] [place]. Niece states pt lives at [place]. He is ambulatory and does not need DME. She believes she does have a RW at his home that she could get for him if needed. Pt can afford his medications. CSW following for ALF. Waiting final recommendations at dc.   Fransico Him MD   Expected Discharge Date: 06/04/2015              Expected Discharge Plan:  Assisted Living / Rest Home  In-House Referral:  Clinical Social Work  Discharge planning Services  CM Consult  Post Acute Care Choice:  NA Choice offered to:  NA  DME Arranged:  N/A DME Agency:  NA  HH Arranged:  NA HH Agency:  NA  Status of Service:  Completed, signed off  Medicare Important Message Given:    Date Medicare IM Given:    Medicare IM give by:    Date Additional Medicare IM Given:    Additional Medicare Important Message give by:     If discussed at [place] of Stay Meetings, dates discussed:    Additional Comments:  Erenest Rasher, RN 06/03/2015, 10:33 AM

## 2015-06-03 NOTE — Progress Notes (Signed)
Patient Demographics  Michael Bradley, is a 82 y.o. male, DOB - 1925-03-27, [id number]  Admit date - 06/02/2015   Admitting Physician Edwin Dada, MD  Outpatient Primary MD for the patient is Serapio Danes, MD  LOS -    Chief Complaint  Patient presents with  . Altered Mental Status       Admission HPI/Brief narrative: 82 y.o. male past medical history significant for dementia, hypothyroidism, and HTN who presents from [place] for altered mental status, Significant for UTI and anemia, Hemoccult stool is positive. Subjective:   Michael Bradley today has, No headache, No chest pain, No abdominal pain - No Nausea,  No Cough - SOB.  Assessment & Plan    Principal Problem:   GI bleed Active Problems:   Dementia   Benign hypertensive heart disease without heart failure   Hypothyroid   Delirium   UTI (lower urinary tract infection)   Chronic blood loss anemia  Anemia - Patient with chronic microcytic anemia, baseline hemoglobin is 9 , glycohemoglobin is 7.7 on admission, dropped to 6.6 after hydration , Hemoccult positive stool , - Anemia workup significant for iron deficiency anemia - GI consult greatly appreciated, plan for colonoscopy +/- EGD tomorrow AM.  - Continue with Protonix - Transfused 1 unit PRBC, hemoglobin remains low at 7.4, will transfuse another unit today - B 12 deficiency, will start on B-12 supplement - Follow on folic acid - Hold aspirin  Acute encephalopathy/confusion - This is most likely metabolic secondary to UTI, as well delirium with baseline dementia. - Appears to be improving, almost back to baseline visit per  His niece   UTI:  - Continue with Rocephin  - Follow on urine culture   Hypothyroidism  - Continue home levothyroxine  Dementia - Continue home galantamine, escitalopram   HTN  Somewhat hypertensive at admission -  Continue home terazosin, lisinopril, statin - Hold furosemide until Saturday, then ordered to restart  GERD  - Continue home PPI  Code Status: DNR  Family Communication: Discussed with niece at bedside  Disposition Plan:  back to ALF when medically stable   Procedures  PRBC transfusion 3/17   Consults   Gastroenterology   Medications  Scheduled Meds: . sodium chloride   Intravenous Once  . sodium chloride   Intravenous Once  . atorvastatin  40 mg Oral q1800  . escitalopram  5 mg Oral Daily  . [START ON 06/04/2015] furosemide  20 mg Oral Daily  . galantamine  12 mg Oral BID  . levothyroxine  25 mcg Oral QAC breakfast  . lisinopril  10 mg Oral Daily  . pantoprazole  40 mg Oral Daily  . peg 3350 powder  0.5 kit Oral Once   And  . [START ON 06/04/2015] peg 3350 powder  0.5 kit Oral Once  . sulfamethoxazole-trimethoprim  1 tablet Oral Q12H  . terazosin  4 mg Oral QHS   Continuous Infusions:  PRN Meds:.acetaminophen  DVT Prophylaxis   SCDs   Lab Results  Component Value Date   PLT 291 06/03/2015    Antibiotics   Anti-infectives    Start     Dose/Rate Route Frequency Ordered Stop   06/02/15 2315  sulfamethoxazole-trimethoprim (BACTRIM DS,SEPTRA DS) 800-160 MG  per tablet 1 tablet     1 tablet Oral Every 12 hours 06/02/15 2249 06/09/15 2159   06/02/15 2030  cefTRIAXone (ROCEPHIN) 1 g in dextrose 5 % 50 mL IVPB     1 g 100 mL/hr over 30 Minutes Intravenous  Once 06/02/15 2027 06/02/15 2109          Objective:   Filed Vitals:   06/03/15 0321 06/03/15 1006 06/03/15 1037 06/03/15 1255  BP: 137/58 145/70 118/66 132/64  Pulse: 67 64 62 64  Temp: 97.2 F (36.2 C) 98.5 F (36.9 C) 98.9 F (37.2 C) 97.5 F (36.4 C)  TempSrc: Oral Oral Oral Oral  Resp: '18 14 16 16  ' Height:      Weight:      SpO2: 95%  95% 96%    Wt Readings from Last 3 Encounters:  06/02/15 74.844 kg (165 lb)  04/27/15 74.844 kg (165 lb)  12/29/14 73.483 kg (162 lb)      Intake/Output Summary (Last 24 hours) at 06/03/15 1546 Last data filed at 06/03/15 1255  Gross per 24 hour  Intake    802 ml  Output      0 ml  Net    802 ml     Physical Exam  Awake Alert,Confused, pleasant Michael Bradley.AT,PERRAL Supple Neck,No JVD, No cervical lymphadenopathy appriciated.  Symmetrical Chest wall movement, Good air movement bilaterally, CTAB RRR,No Gallops,Rubs or new Murmurs, No Parasternal Heave +ve B.Sounds, Abd Soft, No tenderness, No organomegaly appriciated, No rebound - guarding or rigidity. No Cyanosis, Clubbing or edema, No new Rash or bruise     Data Review   Micro Results Recent Results (from the past 240 hour(s))  Urine culture     Status: None (Preliminary result)   Collection Time: 06/02/15  6:43 PM  Result Value Ref Range Status   Specimen Description URINE, CLEAN CATCH  Final   Special Requests NONE  Final   Culture   Final    TOO YOUNG TO READ Performed at [facility]    Report Status PENDING  Incomplete    Radiology Reports Dg Chest 2 View  06/02/2015  CLINICAL DATA:  Altered mental status EXAM: CHEST  2 VIEW COMPARISON:  07/02/2014 FINDINGS: Heart remains mildly enlarged. Diffuse interstitial prominence and hazy airspace disease throughout both lungs. Right hemidiaphragm remains elevated. Small right pleural effusion is suspected. Pulmonary vasculature is indistinct. No left pleural effusion. No pneumothorax. Stable thoracic spine. IMPRESSION: The above findings are worrisome for pulmonary edema and CHF. There is chronic elevation of the right hemidiaphragm. Electronically Signed   By: Marybelle Killings M.D.   On: 06/02/2015 18:24     CBC  Recent Labs Lab 06/02/15 1730 06/03/15 0354 06/03/15 1449  WBC 7.7 7.2 6.6  HGB 7.7* 6.6* 7.8*  HCT 26.8* 23.1* 26.0*  PLT 345 294 291  MCV 77.0* 75.5* 74.7*  MCH 22.1* 21.6* 22.4*  MCHC 28.7* 28.6* 30.0  RDW 17.0* 17.0* 17.4*    Chemistries   Recent Labs Lab 06/02/15 1730  06/03/15 0354  NA 145 143  K 4.2 3.8  CL 113* 113*  CO2 24 23  GLUCOSE 105* 88  BUN 21* 19  CREATININE 1.24 1.17  CALCIUM 9.0 8.4*  AST 21  --   ALT 15*  --   ALKPHOS 78  --   BILITOT 0.5  --    ------------------------------------------------------------------------------------------------------------------ estimated creatinine clearance is 40.1 mL/min (by C-G formula based on Cr of 1.17). ------------------------------------------------------------------------------------------------------------------ No results for input(s): HGBA1C  in the last 72 hours. ------------------------------------------------------------------------------------------------------------------ No results for input(s): CHOL, HDL, LDLCALC, TRIG, CHOLHDL, LDLDIRECT in the last 72 hours. ------------------------------------------------------------------------------------------------------------------  Recent Labs  06/02/15 2249  TSH 3.493   ------------------------------------------------------------------------------------------------------------------  Recent Labs  06/02/15 2249  VITAMINB12 169*  FERRITIN 6*  TIBC 391  IRON 13*  RETICCTPCT 1.3    Coagulation profile No results for input(s): INR, PROTIME in the last 168 hours.  No results for input(s): DDIMER in the last 72 hours.  Cardiac Enzymes No results for input(s): CKMB, TROPONINI, MYOGLOBIN in the last 168 hours.  Invalid input(s): CK ------------------------------------------------------------------------------------------------------------------ Invalid input(s): POCBNP     Time Spent in minutes   30 minutes   Michael Bradley M.D on 06/03/2015 at 3:46 PM  Between 7am to 7pm - Pager - [phone]  After 7pm go to [email or web address] - password [facility]  [facility]   Office  [phone]

## 2015-06-03 NOTE — Consult Note (Signed)
Referring Provider: No ref. provider found Primary Care Physician:  Errol Danes, MD Primary Gastroenterologist:  Dr. Olevia Perches  Reason for Consultation:  Anemia and heme positive stools  HPI: Michael Bradley is a 82 y.o. male past medical history significant for dementia, hypothyroidism, and HTN who presents with anemia and confusion.  The patient was sent over from [place] because of altered mental status.  In the ED, he was afebrile and hemodynamically stable. Chest x-ray showed chronic stable bilateral interstitial opacities without focal consolidation, urinalysis showed full field WBCs and bacteria.  He was noted to have hemoglobin 7.7 grams.  Patient and living facility deny andy rectal bleeding or black stools.  He is not on a blood thinner, takes a baby aspirin but no other NSAIDs. He has no history of GI bleeding.  This AM Hgb was 6.6 grams so he is going to be transfused with PRBC's.  Iron studies confirm iron deficiency.  He is pleasantly confused.  Somewhat reliable historian.  Denies abdominal pain, rectal bleeding, heartburn/reflux, trouble swallowing, weight loss.  Says that his appetite is good.  Last colonoscopy 11/2007 by Dr. Olevia Perches with diverticulosis, hemorrhoids, and a small cecal polyp that was a tubular adenoma.  Past Medical History  Diagnosis Date  . Hypertension   . Hyperlipidemia   . Chest pain, atypical   . Fatigue   . Blurred vision   . Palpitations   . Forgetfulness   . ED (erectile dysfunction)   . History of kidney stones   . 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    Past Surgical History  Procedure Laterality Date  . Colonoscopy    . 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 Cardiovascular stress test  10/08/2002    EF 63%  . Lobectomy  1979  . Cataract extraction      os 2006-od 2010    Prior to Admission medications   Medication Sig Start Date End Date Taking?  Authorizing Provider  acetaminophen (TYLENOL) 325 MG tablet Take 325-650 mg by mouth every 6 (six) hours as needed for mild pain, moderate pain or headache.    Yes Historical Provider, MD  aspirin 81 MG tablet Take 81 mg by mouth daily.     Yes Historical Provider, MD  atorvastatin (LIPITOR) 80 MG tablet Take 0.5 tablets (40 mg total) by mouth daily. 05/15/13  Yes Darlin Coco, MD  chlorpheniramine (CHLOR-TRIMETON) 4 MG tablet Take 4 mg by mouth 2 (two) times daily as needed for allergies.   Yes Historical Provider, MD  escitalopram (LEXAPRO) 5 MG tablet Take 5 mg by mouth daily.   Yes Historical Provider, MD  furosemide (LASIX) 20 MG tablet Take 1 tablet (20 mg total) by mouth daily. 07/07/14  Yes Darlin Coco, MD  galantamine (RAZADYNE ER) 24 MG 24 hr capsule Take 1 capsule (24 mg total) by mouth daily with breakfast. 12/13/14  Yes Star Age, MD  levothyroxine (SYNTHROID, LEVOTHROID) 25 MCG tablet TAKE 1 TABLET ONCE DAILY EXCEPT 2 TABLETS ON WEDNESDAY AND ON SUNDAY. 02/04/15  Yes Darlin Coco, MD  lisinopril (PRINIVIL,ZESTRIL) 20 MG tablet Take 10 mg by mouth daily.     Yes Historical Provider, MD  omeprazole (PRILOSEC) 20 MG capsule Take 20 mg by mouth at bedtime.    Yes Historical Provider, MD  terazosin (HYTRIN) 2 MG capsule Take 4 mg by mouth at bedtime. 02/04/13  Yes Darlin Coco, MD    Current Facility-Administered Medications  Medication Dose Route Frequency Provider Last Rate Last Dose  . 0.9 %  sodium chloride infusion   Intravenous Once Dianne Dun, NP      . acetaminophen (TYLENOL) tablet 325-650 mg  325-650 mg Oral Q6H PRN Edwin Dada, MD      . aspirin EC tablet 81 mg  81 mg Oral Daily Edwin Dada, MD   81 mg at 06/03/15 0848  . atorvastatin (LIPITOR) tablet 40 mg  40 mg Oral q1800 Edwin Dada, MD      . escitalopram (LEXAPRO) tablet 5 mg  5 mg Oral Daily Edwin Dada, MD   5 mg at 06/03/15 0848  . [START ON  06/04/2015] furosemide (LASIX) tablet 20 mg  20 mg Oral Daily Edwin Dada, MD      . galantamine (RAZADYNE) tablet 12 mg  12 mg Oral BID Edwin Dada, MD   12 mg at 06/03/15 0848  . levothyroxine (SYNTHROID, LEVOTHROID) tablet 25 mcg  25 mcg Oral QAC breakfast Edwin Dada, MD   25 mcg at 06/03/15 0849  . lisinopril (PRINIVIL,ZESTRIL) tablet 10 mg  10 mg Oral Daily Edwin Dada, MD   10 mg at 06/03/15 0849  . pantoprazole (PROTONIX) EC tablet 40 mg  40 mg Oral Daily Edwin Dada, MD   40 mg at 06/03/15 0849  . sulfamethoxazole-trimethoprim (BACTRIM DS,SEPTRA DS) 800-160 MG per tablet 1 tablet  1 tablet Oral Q12H Edwin Dada, MD   1 tablet at 06/03/15 0849  . terazosin (HYTRIN) capsule 4 mg  4 mg Oral QHS Edwin Dada, MD   4 mg at 06/02/15 2339    Allergies as of 06/02/2015 - Review Complete 06/02/2015  Allergen Reaction Noted  . Lisinopril Cough 11/29/2010  . Quinolones Other (See Comments) 11/29/2010    Family History  Problem Relation Age of Onset  . Lung cancer Father   . Emphysema Father   . Lung cancer Brother   . Leukemia Mother   . Heart attack Brother   . Kidney cancer Brother     Social History   Social History  . Marital Status: Married    Spouse Name: N/A  . Number of Children: 0  . Years of Education: 12   Occupational History  .     Social History Main Topics  . Smoking status: Former Smoker    Quit date: 11/28/1980  . Smokeless tobacco: Never Used  . Alcohol Use: No  . Drug Use: No  . Sexual Activity: Not on file   Other Topics Concern  . Not on file   Social History Narrative   Pt lives at home with his spouse.   Caffeine Use: 2 cups of caffeine per day.     Review of Systems: Ten point ROS is O/W negative except as mentioned in HPI  Physical Exam: Vital signs in last 24 hours: Temp:  [97.2 F (36.2 C)-98.2 F (36.8 C)] 97.2 F (36.2 C) (03/17 0321) Pulse Rate:  [59-67] 67  (03/17 0321) Resp:  [14-18] 18 (03/17 0321) BP: (124-158)/(58-82) 137/58 mmHg (03/17 0321) SpO2:  [92 %-97 %] 95 % (03/17 0321) Weight:  [165 lb (74.844 kg)] 165 lb (74.844 kg) (03/16 2240) Last BM Date: 06/02/15 General:  Alert, Well-developed, well-nourished, pleasant and cooperative in NAD Head:  Normocephalic and atraumatic. Eyes:  Sclera clear, no icterus.  Conjunctiva pink. Ears:  Normal auditory acuity.  Mouth:  No deformity or lesions.   Lungs:  Clear throughout to auscultation.  No wheezes, crackles, or rhonchi.  Heart:  Regular rate and rhythm; no murmurs, clicks, rubs, or gallops. Abdomen:  Soft, non-distended.  BS present.  Non-tender. Rectal:  Deferred.  Heme positive in ED.  Msk:  Symmetrical without gross deformities. Pulses:  Normal pulses noted. Extremities:  Without clubbing or edema. Neurologic:  Alert and oriented x 4;  grossly normal neurologically. Skin:  Intact without significant lesions or rashes. Psych:  Alert and cooperative. Normal mood and affect.  Intake/Output this shift: Total I/O In: 480 [P.O.:480] Out: -   Lab Results:  Recent Labs  06/02/15 1730 06/03/15 0354  WBC 7.7 7.2  HGB 7.7* 6.6*  HCT 26.8* 23.1*  PLT 345 294   BMET  Recent Labs  06/02/15 1730 06/03/15 0354  NA 145 143  K 4.2 3.8  CL 113* 113*  CO2 24 23  GLUCOSE 105* 88  BUN 21* 19  CREATININE 1.24 1.17  CALCIUM 9.0 8.4*   LFT  Recent Labs  06/02/15 1730  PROT 7.5  ALBUMIN 4.2  AST 21  ALT 15*  ALKPHOS 78  BILITOT 0.5   Studies/Results: Dg Chest 2 View  06/02/2015  CLINICAL DATA:  Altered mental status EXAM: CHEST  2 VIEW COMPARISON:  07/02/2014 FINDINGS: Heart remains mildly enlarged. Diffuse interstitial prominence and hazy airspace disease throughout both lungs. Right hemidiaphragm remains elevated. Small right pleural effusion is suspected. Pulmonary vasculature is indistinct. No left pleural effusion. No pneumothorax. Stable thoracic spine. IMPRESSION:  The above findings are worrisome for pulmonary edema and CHF. There is chronic elevation of the right hemidiaphragm. Electronically Signed   By: Marybelle Killings M.D.   On: 06/02/2015 18:24   IMPRESSION:  -Heme positive anemia:  No evidence of overt blood loss.  Last colonoscopy 7.5 years ago.  Hgb down some this AM, which is likely dilutional.  Going to receive PRBC's this AM.  Iron studies suggest IDA.  PLAN: -Agree with transfusion.  Monitor Hgb. -Will discuss regarding colonoscopy and/or EGD with Dr. Ardis Hughs.   ZEHR, JESSICA D.  06/03/2015, 9:03 AM  Pager number [phone]     ________________________________________________________________________  Velora Heckler GI MD note:  I personally examined the patient, reviewed the data and agree with the assessment and plan described above.  Heme +, microcytic anemia.  Will plan workup with colonoscopy +/- EGD tomorrow AM.     Owens Loffler, MD [facility]astroenter[facility] Pager [phone]

## 2015-06-04 ENCOUNTER — Encounter (HOSPITAL_COMMUNITY)
Admission: EM | Disposition: A | Discharge status: Home Health | Payer: Self-pay | Source: Home / Self Care | Attending: Internal Medicine

## 2015-06-04 ENCOUNTER — Encounter (HOSPITAL_COMMUNITY): Payer: Self-pay

## 2015-06-04 DIAGNOSIS — K922 Gastrointestinal hemorrhage, unspecified: Secondary | ICD-10-CM | POA: Insufficient documentation

## 2015-06-04 DIAGNOSIS — I119 Hypertensive heart disease without heart failure: Secondary | ICD-10-CM

## 2015-06-04 DIAGNOSIS — K5730 Diverticulosis of large intestine without perforation or abscess without bleeding: Secondary | ICD-10-CM

## 2015-06-04 HISTORY — PX: COLONOSCOPY: SHX5424

## 2015-06-04 HISTORY — PX: ESOPHAGOGASTRODUODENOSCOPY: SHX5428

## 2015-06-04 LAB — CBC
HCT: 31.7 % — ABNORMAL LOW (ref 39.0–52.0)
HEMOGLOBIN: 9.7 g/dL — AB (ref 13.0–17.0)
MCH: 24.3 pg — ABNORMAL LOW (ref 26.0–34.0)
MCHC: 30.6 g/dL (ref 30.0–36.0)
MCV: 79.3 fL (ref 78.0–100.0)
PLATELETS: 323 10*3/uL (ref 150–400)
RBC: 4 MIL/uL — AB (ref 4.22–5.81)
RDW: 17.9 % — ABNORMAL HIGH (ref 11.5–15.5)
WBC: 8.6 10*3/uL (ref 4.0–10.5)

## 2015-06-04 LAB — BASIC METABOLIC PANEL
ANION GAP: 9 (ref 5–15)
BUN: 14 mg/dL (ref 6–20)
CO2: 21 mmol/L — ABNORMAL LOW (ref 22–32)
Calcium: 9.2 mg/dL (ref 8.9–10.3)
Chloride: 113 mmol/L — ABNORMAL HIGH (ref 101–111)
Creatinine, Ser: 1.28 mg/dL — ABNORMAL HIGH (ref 0.61–1.24)
GFR calc Af Amer: 56 mL/min — ABNORMAL LOW (ref >60)
GFR, EST NON AFRICAN AMERICAN: 49 mL/min — AB (ref >60)
Glucose, Bld: 96 mg/dL (ref 65–99)
POTASSIUM: 4.1 mmol/L (ref 3.5–5.1)
SODIUM: 143 mmol/L (ref 135–145)

## 2015-06-04 SURGERY — EGD (ESOPHAGOGASTRODUODENOSCOPY)
Anesthesia: Moderate Sedation

## 2015-06-04 MED ORDER — FERROUS SULFATE 325 (65 FE) MG PO TABS
325.0000 mg | ORAL_TABLET | Freq: Every day | ORAL | Status: DC
Start: 2015-06-05 — End: 2015-06-05
  Administered 2015-06-05: 325 mg via ORAL
  Filled 2015-06-04 (×2): qty 1

## 2015-06-04 MED ORDER — SODIUM CHLORIDE 0.9 % IV SOLN
510.0000 mg | Freq: Once | INTRAVENOUS | Status: AC
  Administered 2015-06-04: 510 mg via INTRAVENOUS
  Filled 2015-06-04: qty 17

## 2015-06-04 MED ORDER — FENTANYL CITRATE (PF) 100 MCG/2ML IJ SOLN
INTRAMUSCULAR | Status: AC
  Filled 2015-06-04: qty 2

## 2015-06-04 MED ORDER — MIDAZOLAM HCL 5 MG/ML IJ SOLN
INTRAMUSCULAR | Status: AC
  Filled 2015-06-04: qty 2

## 2015-06-04 MED ORDER — FENTANYL CITRATE (PF) 100 MCG/2ML IJ SOLN
INTRAMUSCULAR | Status: DC | PRN
  Administered 2015-06-04 (×2): 25 ug via INTRAVENOUS

## 2015-06-04 MED ORDER — SODIUM CHLORIDE 0.9 % IV SOLN
INTRAVENOUS | Status: DC
Start: 2015-06-04 — End: 2015-06-05

## 2015-06-04 MED ORDER — MIDAZOLAM HCL 10 MG/2ML IJ SOLN
INTRAMUSCULAR | Status: DC | PRN
Start: 2015-06-04 — End: 2015-06-04
  Administered 2015-06-04 (×2): 2 mg via INTRAVENOUS

## 2015-06-04 MED ORDER — ASPIRIN EC 81 MG PO TBEC
81.0000 mg | DELAYED_RELEASE_TABLET | Freq: Every day | ORAL | Status: DC
  Administered 2015-06-05: 81 mg via ORAL
  Filled 2015-06-04: qty 1

## 2015-06-04 MED ORDER — DEXTROSE 5 % IV SOLN
1.0000 g | INTRAVENOUS | Status: DC
  Administered 2015-06-04: 1 g via INTRAVENOUS
  Filled 2015-06-04: qty 10

## 2015-06-04 NOTE — Interval H&P Note (Signed)
History and Physical Interval Note:  06/04/2015 8:37 AM  Michael Bradley  has presented today for surgery, with the diagnosis of Anemia and heme positive stool  The various methods of treatment have been discussed with the patient and family. After consideration of risks, benefits and other options for treatment, the patient has consented to  Procedure(s): ESOPHAGOGASTRODUODENOSCOPY (EGD) (N/A) COLONOSCOPY (N/A) as a surgical intervention .  The patient's history has been reviewed, patient examined, no change in status, stable for surgery.  I have reviewed the patient's chart and labs.  Questions were answered to the patient's satisfaction.     Milus Banister

## 2015-06-04 NOTE — Op Note (Signed)
[facility] Patient Name: Michael Bradley Procedure Date: 06/04/2015 MRN: [id number] Attending MD: Milus Banister , MD Date of Birth: July 03, 1925 CSN:  Age: 82 Admit Type: Inpatient Procedure:                Upper GI endoscopy Indications:              Iron deficiency anemia Providers:                Milus Banister, MD, Hilma Favors, RN, Cherylynn Ridges, Technician Referring MD:              Medicines:                None Complications:            No immediate complications. Estimated Blood Loss:     Estimated blood loss: none. Procedure:                Pre-Anesthesia Assessment:                           - Prior to the procedure, a History and Physical                            was performed, and patient medications and                            allergies were reviewed. The patient's tolerance of                            previous anesthesia was also reviewed. The risks                            and benefits of the procedure and the sedation                            options and risks were discussed with the patient.                            All questions were answered, and informed consent                            was obtained. Prior Anticoagulants: The patient has                            taken no previous anticoagulant or antiplatelet                            agents. ASA Grade Assessment: III - A patient with                            severe systemic disease. After reviewing the risks  and benefits, the patient was deemed in                            satisfactory condition to undergo the procedure.                           - Prior to the procedure, a History and Physical                            was performed, and patient medications and                            allergies were reviewed. The patient's tolerance of                            previous anesthesia was also reviewed. The risks                            and benefits of the procedure and the sedation                            options and risks were discussed with the patient.                            All questions were answered, and informed consent                            was obtained. Prior Anticoagulants: The patient has                            taken no previous anticoagulant or antiplatelet                            agents. ASA Grade Assessment: II - A patient with                            mild systemic disease. After reviewing the risks                            and benefits, the patient was deemed in                            satisfactory condition to undergo the procedure.                           After obtaining informed consent, the endoscope was                            passed under direct vision. Throughout the                            procedure, the patient's blood pressure, pulse, and  oxygen saturations were monitored continuously. The                            EG-[id number]) scope was introduced through the                            mouth, and advanced to the second part of duodenum.                            The upper GI endoscopy was accomplished without                            difficulty. The patient tolerated the procedure                            well. Scope In: Scope Out: Findings:      The esophagus was normal.      The stomach was normal.      The examined duodenum was normal.      Biopsies were taken with a cold forceps in the entire duodenum for       histology. Impression:               - Normal esophagus.                           - Normal stomach.                           - Normal examined duodenum.                           - Biopsies were taken with a cold forceps for                            histology in the entire duodenum. Moderate Sedation:      N/A- Per Anesthesia Care Recommendation:           - Return patient to  hospital ward for ongoing care.                           - No further GI workup is planned. He should stay                            on daily iron replacement. OK to d/c later today.                           - Will contact him with final pathology from                            duodenum biopsies (done to check for celiac sprue)                           - Resume previous diet.                           -  Continue present medications. Procedure Code(s):        --- Professional ---                           [phone], Esophagogastroduodenoscopy, flexible,                            transoral; with biopsy, single or multiple Diagnosis Code(s):        --- Professional ---                           D50.9, Iron deficiency anemia, unspecified CPT copyright 2016 American Medical Association. All rights reserved. The codes documented in this report are preliminary and upon coder review may  be revised to meet current compliance requirements. Milus Banister, MD Milus Banister, MD 06/04/2015 9:16:11 AM Number of Addenda: 0

## 2015-06-04 NOTE — Progress Notes (Signed)
Colonoscopy, EGD completed.  See full reports in epic.  NO clear source of his IDA noted.  Biopsies taken from duodenum to check for sprue (which I doubt he will have). He's had no overt bleeding.  His Hb has bumped nicely.  Given advanced age, dementia I think it is perfectly reasonable to let him go home without further testing. He should stay on iron once daily.  I discussed this with his niece and she agrees.

## 2015-06-04 NOTE — Progress Notes (Signed)
Patient Demographics  Michael Bradley, is a 82 y.o. male, DOB - January 21, 1926, [id number]  Admit date - 06/02/2015   Admitting Physician Edwin Dada, MD  Outpatient Primary MD for the patient is Malakhi Danes, MD  LOS - 1   Chief Complaint  Patient presents with  . Altered Mental Status       Admission HPI/Brief narrative: 82 y.o. male past medical history significant for dementia, hypothyroidism, and HTN who presents from [place] for altered mental status, Significant for UTI and anemia, Hemoccult stool is positive, No evidence of overt bleeding, status post colonoscopy/endoscopy 3/18 with no evidence of source of bleed.  Subjective:   Michael Bradley today has, No headache, No chest pain, No abdominal pain - No Nausea,  No Cough - SOB.  Assessment & Plan    Principal Problem:   GI bleed Active Problems:   Dementia   Benign hypertensive heart disease without heart failure   Hypothyroid   Delirium   UTI (lower urinary tract infection)   Chronic blood loss anemia   Bleeding gastrointestinal  Anemia - Patient with chronic microcytic anemia, baseline hemoglobin is 9 , glycohemoglobin is 7.7 on admission, dropped to 6.6 after hydration , Hemoccult positive stool . - Anemia workup significant for iron deficiency anemia, and B-12 deficiency. - GI consult greatly appreciated, but for colonoscopy and endoscopy today, with no evidence of active bleed or findings to suggest short of bleed. - Continue with Protonix - Transfused 2 units PRBC since admission with good response, hemoglobin remained stable at 9.2 today. - B 12 deficiency, will start on B-12 supplement - Workup significant for iron deficiency, started on by mouth iron, will give one dose of IV Feraheme today - Follow on folic acid - Initially as her hold, will resume as no evidence of active bleed  Acute  encephalopathy/confusion - This is most likely metabolic secondary to UTI, as well delirium with baseline dementia. - Appears to be improving, back to baseline visit per  His niece   UTI:  - Continue with Rocephin  - Follow on urine culture   Hypothyroidism  - Continue home levothyroxine  Dementia - Continue home galantamine, escitalopram   HTN  Somewhat hypertensive at admission - Continue home terazosin, lisinopril, statin - Hold furosemide until Saturday, then ordered to restart  GERD  - Continue home PPI  Code Status: DNR  Family Communication: Discussed with niece at bedside  Disposition Plan:  back to ALF in a.m. of hemoglobin is stable   Procedures  2 units PRBC transfusion 3/17 Colonoscopy and endoscopy 3/18   Consults   Gastroenterology   Medications  Scheduled Meds: . sodium chloride   Intravenous Once  . sodium chloride   Intravenous Once  . atorvastatin  40 mg Oral q1800  . cyanocobalamin  1,000 mcg Intramuscular Daily  . escitalopram  5 mg Oral Daily  . [START ON 06/05/2015] ferrous sulfate  325 mg Oral Q breakfast  . furosemide  20 mg Oral Daily  . galantamine  12 mg Oral BID  . levothyroxine  25 mcg Oral QAC breakfast  . lisinopril  10 mg Oral Daily  . pantoprazole  40 mg Oral Daily  . terazosin  4 mg Oral QHS  Continuous Infusions: . sodium chloride     PRN Meds:.acetaminophen  DVT Prophylaxis   SCDs   Lab Results  Component Value Date   PLT 323 06/04/2015    Antibiotics   Anti-infectives    Start     Dose/Rate Route Frequency Ordered Stop   06/03/15 2000  cefTRIAXone (ROCEPHIN) 1 g in dextrose 5 % 50 mL IVPB     1 g 100 mL/hr over 30 Minutes Intravenous  Once 06/03/15 1554 06/03/15 2129   06/02/15 2315  sulfamethoxazole-trimethoprim (BACTRIM DS,SEPTRA DS) 800-160 MG per tablet 1 tablet  Status:  Discontinued     1 tablet Oral Every 12 hours 06/02/15 2249 06/03/15 1554   06/02/15 2030  cefTRIAXone (ROCEPHIN) 1 g in  dextrose 5 % 50 mL IVPB     1 g 100 mL/hr over 30 Minutes Intravenous  Once 06/02/15 2027 06/02/15 2109          Objective:   Filed Vitals:   06/04/15 0930 06/04/15 0935 06/04/15 1008 06/04/15 1446  BP: 128/57 135/69 149/68 130/63  Pulse: 62 64 57 71  Temp:   97.6 F (36.4 C) 98 F (36.7 C)  TempSrc:   Oral Oral  Resp: 16 16 16 15   Height:      Weight:      SpO2: 97% 98% 98% 94%    Wt Readings from Last 3 Encounters:  06/02/15 74.844 kg (165 lb)  04/27/15 74.844 kg (165 lb)  12/29/14 73.483 kg (162 lb)     Intake/Output Summary (Last 24 hours) at 06/04/15 1525 Last data filed at 06/04/15 1330  Gross per 24 hour  Intake   1055 ml  Output    488 ml  Net    567 ml     Physical Exam  Awake Alert,Confused, pleasant .AT,PERRAL Supple Neck,No JVD, No cervical lymphadenopathy appriciated.  Symmetrical Chest wall movement, Good air movement bilaterally, CTAB RRR,No Gallops,Rubs or new Murmurs, No Parasternal Heave +ve B.Sounds, Abd Soft, No tenderness, No organomegaly appriciated, No rebound - guarding or rigidity. No Cyanosis, Clubbing or edema, No new Rash or bruise     Data Review   Micro Results Recent Results (from the past 240 hour(s))  Urine culture     Status: None (Preliminary result)   Collection Time: 06/02/15  6:43 PM  Result Value Ref Range Status   Specimen Description URINE, CLEAN CATCH  Final   Special Requests NONE  Final   Culture   Final    CULTURE REINCUBATED FOR BETTER GROWTH Performed at [facility]    Report Status PENDING  Incomplete    Radiology Reports Dg Chest 2 View  06/02/2015  CLINICAL DATA:  Altered mental status EXAM: CHEST  2 VIEW COMPARISON:  07/02/2014 FINDINGS: Heart remains mildly enlarged. Diffuse interstitial prominence and hazy airspace disease throughout both lungs. Right hemidiaphragm remains elevated. Small right pleural effusion is suspected. Pulmonary vasculature is indistinct. No left pleural  effusion. No pneumothorax. Stable thoracic spine. IMPRESSION: The above findings are worrisome for pulmonary edema and CHF. There is chronic elevation of the right hemidiaphragm. Electronically Signed   By: Marybelle Killings M.D.   On: 06/02/2015 18:24     CBC  Recent Labs Lab 06/02/15 1730 06/03/15 0354 06/03/15 1449 06/03/15 2301 06/04/15 0438  WBC 7.7 7.2 6.6 8.3 8.6  HGB 7.7* 6.6* 7.8* 9.2* 9.7*  HCT 26.8* 23.1* 26.0* 29.6* 31.7*  PLT 345 294 291 292 323  MCV 77.0* 75.5* 74.7* 75.1* 79.3  MCH  22.1* 21.6* 22.4* 23.4* 24.3*  MCHC 28.7* 28.6* 30.0 31.1 30.6  RDW 17.0* 17.0* 17.4* 17.6* 17.9*    Chemistries   Recent Labs Lab 06/02/15 1730 06/03/15 0354 06/04/15 0438  NA 145 143 143  K 4.2 3.8 4.1  CL 113* 113* 113*  CO2 24 23 21*  GLUCOSE 105* 88 96  BUN 21* 19 14  CREATININE 1.24 1.17 1.28*  CALCIUM 9.0 8.4* 9.2  AST 21  --   --   ALT 15*  --   --   ALKPHOS 78  --   --   BILITOT 0.5  --   --    ------------------------------------------------------------------------------------------------------------------ estimated creatinine clearance is 36.7 mL/min (by C-G formula based on Cr of 1.28). ------------------------------------------------------------------------------------------------------------------ No results for input(s): HGBA1C in the last 72 hours. ------------------------------------------------------------------------------------------------------------------ No results for input(s): CHOL, HDL, LDLCALC, TRIG, CHOLHDL, LDLDIRECT in the last 72 hours. ------------------------------------------------------------------------------------------------------------------  Recent Labs  06/02/15 2249  TSH 3.493   ------------------------------------------------------------------------------------------------------------------  Recent Labs  06/02/15 2249  VITAMINB12 169*  FERRITIN 6*  TIBC 391  IRON 13*  RETICCTPCT 1.3    Coagulation profile No results for  input(s): INR, PROTIME in the last 168 hours.  No results for input(s): DDIMER in the last 72 hours.  Cardiac Enzymes No results for input(s): CKMB, TROPONINI, MYOGLOBIN in the last 168 hours.  Invalid input(s): CK ------------------------------------------------------------------------------------------------------------------ Invalid input(s): POCBNP     Time Spent in minutes   25 minutes   ELGERGAWY, DAWOOD M.D on 06/04/2015 at 3:25 PM  Between 7am to 7pm - Pager - [phone]  After 7pm go to [email or web address] - password [facility]  [facility]   Office  [phone]

## 2015-06-04 NOTE — H&P (View-Only) (Signed)
Referring Provider: No ref. provider found Primary Care Physician:  Parris Danes, MD Primary Gastroenterologist:  Dr. Olevia Perches  Reason for Consultation:  Anemia and heme positive stools  HPI: Michael Bradley is a 82 y.o. male past medical history significant for dementia, hypothyroidism, and HTN who presents with anemia and confusion.  The patient was sent over from [place] because of altered mental status.  In the ED, he was afebrile and hemodynamically stable. Chest x-ray showed chronic stable bilateral interstitial opacities without focal consolidation, urinalysis showed full field WBCs and bacteria.  He was noted to have hemoglobin 7.7 grams.  Patient and living facility deny andy rectal bleeding or black stools.  He is not on a blood thinner, takes a baby aspirin but no other NSAIDs. He has no history of GI bleeding.  This AM Hgb was 6.6 grams so he is going to be transfused with PRBC's.  Iron studies confirm iron deficiency.  He is pleasantly confused.  Somewhat reliable historian.  Denies abdominal pain, rectal bleeding, heartburn/reflux, trouble swallowing, weight loss.  Says that his appetite is good.  Last colonoscopy 11/2007 by Dr. Olevia Perches with diverticulosis, hemorrhoids, and a small cecal polyp that was a tubular adenoma.  Past Medical History  Diagnosis Date  . Hypertension   . Hyperlipidemia   . Chest pain, atypical   . Fatigue   . Blurred vision   . Palpitations   . Forgetfulness   . ED (erectile dysfunction)   . History of kidney stones   . 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    Past Surgical History  Procedure Laterality Date  . Colonoscopy    . 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 Cardiovascular stress test  10/08/2002    EF 63%  . Lobectomy  1979  . Cataract extraction      os 2006-od 2010    Prior to Admission medications   Medication Sig Start Date End Date Taking?  Authorizing Provider  acetaminophen (TYLENOL) 325 MG tablet Take 325-650 mg by mouth every 6 (six) hours as needed for mild pain, moderate pain or headache.    Yes Historical Provider, MD  aspirin 81 MG tablet Take 81 mg by mouth daily.     Yes Historical Provider, MD  atorvastatin (LIPITOR) 80 MG tablet Take 0.5 tablets (40 mg total) by mouth daily. 05/15/13  Yes Darlin Coco, MD  chlorpheniramine (CHLOR-TRIMETON) 4 MG tablet Take 4 mg by mouth 2 (two) times daily as needed for allergies.   Yes Historical Provider, MD  escitalopram (LEXAPRO) 5 MG tablet Take 5 mg by mouth daily.   Yes Historical Provider, MD  furosemide (LASIX) 20 MG tablet Take 1 tablet (20 mg total) by mouth daily. 07/07/14  Yes Darlin Coco, MD  galantamine (RAZADYNE ER) 24 MG 24 hr capsule Take 1 capsule (24 mg total) by mouth daily with breakfast. 12/13/14  Yes Star Age, MD  levothyroxine (SYNTHROID, LEVOTHROID) 25 MCG tablet TAKE 1 TABLET ONCE DAILY EXCEPT 2 TABLETS ON WEDNESDAY AND ON SUNDAY. 02/04/15  Yes Darlin Coco, MD  lisinopril (PRINIVIL,ZESTRIL) 20 MG tablet Take 10 mg by mouth daily.     Yes Historical Provider, MD  omeprazole (PRILOSEC) 20 MG capsule Take 20 mg by mouth at bedtime.    Yes Historical Provider, MD  terazosin (HYTRIN) 2 MG capsule Take 4 mg by mouth at bedtime. 02/04/13  Yes Darlin Coco, MD    Current Facility-Administered Medications  Medication Dose Route Frequency Provider Last Rate Last Dose  . 0.9 %  sodium chloride infusion   Intravenous Once Dianne Dun, NP      . acetaminophen (TYLENOL) tablet 325-650 mg  325-650 mg Oral Q6H PRN Edwin Dada, MD      . aspirin EC tablet 81 mg  81 mg Oral Daily Edwin Dada, MD   81 mg at 06/03/15 0848  . atorvastatin (LIPITOR) tablet 40 mg  40 mg Oral q1800 Edwin Dada, MD      . escitalopram (LEXAPRO) tablet 5 mg  5 mg Oral Daily Edwin Dada, MD   5 mg at 06/03/15 0848  . [START ON  06/04/2015] furosemide (LASIX) tablet 20 mg  20 mg Oral Daily Edwin Dada, MD      . galantamine (RAZADYNE) tablet 12 mg  12 mg Oral BID Edwin Dada, MD   12 mg at 06/03/15 0848  . levothyroxine (SYNTHROID, LEVOTHROID) tablet 25 mcg  25 mcg Oral QAC breakfast Edwin Dada, MD   25 mcg at 06/03/15 0849  . lisinopril (PRINIVIL,ZESTRIL) tablet 10 mg  10 mg Oral Daily Edwin Dada, MD   10 mg at 06/03/15 0849  . pantoprazole (PROTONIX) EC tablet 40 mg  40 mg Oral Daily Edwin Dada, MD   40 mg at 06/03/15 0849  . sulfamethoxazole-trimethoprim (BACTRIM DS,SEPTRA DS) 800-160 MG per tablet 1 tablet  1 tablet Oral Q12H Edwin Dada, MD   1 tablet at 06/03/15 0849  . terazosin (HYTRIN) capsule 4 mg  4 mg Oral QHS Edwin Dada, MD   4 mg at 06/02/15 2339    Allergies as of 06/02/2015 - Review Complete 06/02/2015  Allergen Reaction Noted  . Lisinopril Cough 11/29/2010  . Quinolones Other (See Comments) 11/29/2010    Family History  Problem Relation Age of Onset  . Lung cancer Father   . Emphysema Father   . Lung cancer Brother   . Leukemia Mother   . Heart attack Brother   . Kidney cancer Brother     Social History   Social History  . Marital Status: Married    Spouse Name: N/A  . Number of Children: 0  . Years of Education: 12   Occupational History  .     Social History Main Topics  . Smoking status: Former Smoker    Quit date: 11/28/1980  . Smokeless tobacco: Never Used  . Alcohol Use: No  . Drug Use: No  . Sexual Activity: Not on file   Other Topics Concern  . Not on file   Social History Narrative   Pt lives at home with his spouse.   Caffeine Use: 2 cups of caffeine per day.     Review of Systems: Ten point ROS is O/W negative except as mentioned in HPI  Physical Exam: Vital signs in last 24 hours: Temp:  [97.2 F (36.2 C)-98.2 F (36.8 C)] 97.2 F (36.2 C) (03/17 0321) Pulse Rate:  [59-67] 67  (03/17 0321) Resp:  [14-18] 18 (03/17 0321) BP: (124-158)/(58-82) 137/58 mmHg (03/17 0321) SpO2:  [92 %-97 %] 95 % (03/17 0321) Weight:  [165 lb (74.844 kg)] 165 lb (74.844 kg) (03/16 2240) Last BM Date: 06/02/15 General:  Alert, Well-developed, well-nourished, pleasant and cooperative in NAD Head:  Normocephalic and atraumatic. Eyes:  Sclera clear, no icterus.  Conjunctiva pink. Ears:  Normal auditory acuity.  Mouth:  No deformity or lesions.   Lungs:  Clear throughout to auscultation.  No wheezes, crackles, or rhonchi.  Heart:  Regular rate and rhythm; no murmurs, clicks, rubs, or gallops. Abdomen:  Soft, non-distended.  BS present.  Non-tender. Rectal:  Deferred.  Heme positive in ED.  Msk:  Symmetrical without gross deformities. Pulses:  Normal pulses noted. Extremities:  Without clubbing or edema. Neurologic:  Alert and oriented x 4;  grossly normal neurologically. Skin:  Intact without significant lesions or rashes. Psych:  Alert and cooperative. Normal mood and affect.  Intake/Output this shift: Total I/O In: 480 [P.O.:480] Out: -   Lab Results:  Recent Labs  06/02/15 1730 06/03/15 0354  WBC 7.7 7.2  HGB 7.7* 6.6*  HCT 26.8* 23.1*  PLT 345 294   BMET  Recent Labs  06/02/15 1730 06/03/15 0354  NA 145 143  K 4.2 3.8  CL 113* 113*  CO2 24 23  GLUCOSE 105* 88  BUN 21* 19  CREATININE 1.24 1.17  CALCIUM 9.0 8.4*   LFT  Recent Labs  06/02/15 1730  PROT 7.5  ALBUMIN 4.2  AST 21  ALT 15*  ALKPHOS 78  BILITOT 0.5   Studies/Results: Dg Chest 2 View  06/02/2015  CLINICAL DATA:  Altered mental status EXAM: CHEST  2 VIEW COMPARISON:  07/02/2014 FINDINGS: Heart remains mildly enlarged. Diffuse interstitial prominence and hazy airspace disease throughout both lungs. Right hemidiaphragm remains elevated. Small right pleural effusion is suspected. Pulmonary vasculature is indistinct. No left pleural effusion. No pneumothorax. Stable thoracic spine. IMPRESSION:  The above findings are worrisome for pulmonary edema and CHF. There is chronic elevation of the right hemidiaphragm. Electronically Signed   By: Marybelle Killings M.D.   On: 06/02/2015 18:24   IMPRESSION:  -Heme positive anemia:  No evidence of overt blood loss.  Last colonoscopy 7.5 years ago.  Hgb down some this AM, which is likely dilutional.  Going to receive PRBC's this AM.  Iron studies suggest IDA.  PLAN: -Agree with transfusion.  Monitor Hgb. -Will discuss regarding colonoscopy and/or EGD with Dr. Ardis Hughs.   ZEHR, JESSICA D.  06/03/2015, 9:03 AM  Pager number [phone]     ________________________________________________________________________  Velora Heckler GI MD note:  I personally examined the patient, reviewed the data and agree with the assessment and plan described above.  Heme +, microcytic anemia.  Will plan workup with colonoscopy +/- EGD tomorrow AM.     Owens Loffler, MD [facility] Pager [phone]

## 2015-06-04 NOTE — Op Note (Signed)
[facility] Patient Name: Michael Bradley Procedure Date: 06/04/2015 MRN: [id number] Attending MD: Milus Banister , MD Date of Birth: August 26, 1925 CSN:  Age: 82 Admit Type: Inpatient Procedure:                Colonoscopy Indications:              Iron deficiency anemia Providers:                Milus Banister, MD, Hilma Favors, RN, Cherylynn Ridges, Technician Referring MD:              Medicines:                Fentanyl 50 micrograms IV, Midazolam 4 mg IV Complications:            No immediate complications. Estimated Blood Loss:     Estimated blood loss: none. Procedure:                Pre-Anesthesia Assessment:                           - Prior to the procedure, a History and Physical                            was performed, and patient medications and                            allergies were reviewed. The patient's tolerance of                            previous anesthesia was also reviewed. The risks                            and benefits of the procedure and the sedation                            options and risks were discussed with the patient.                            All questions were answered, and informed consent                            was obtained. Prior Anticoagulants: The patient has                            taken no previous anticoagulant or antiplatelet                            agents. ASA Grade Assessment: III - A patient with                            severe systemic disease. After reviewing the risks  and benefits, the patient was deemed in                            satisfactory condition to undergo the procedure.                           After obtaining informed consent, the colonoscope                            was passed under direct vision. Throughout the                            procedure, the patient's blood pressure, pulse, and                            oxygen  saturations were monitored continuously. The                            [id number] scope was introduced through                            the anus and advanced to the the cecum, identified                            by appendiceal orifice and ileocecal valve. The                            colonoscopy was performed without difficulty. The                            patient tolerated the procedure well. The quality                            of the bowel preparation was good. The ileocecal                            valve, appendiceal orifice, and rectum were                            photographed. Scope In: 8:41:35 AM Scope Out: 8:54:35 AM Scope Withdrawal Time: 0 hours 6 minutes 51 seconds  Total Procedure Duration: 0 hours 13 minutes 0 seconds  Findings:      Multiple medium-mouthed diverticula were found in the left colon.      The exam was otherwise without abnormality. Impression:               - Diverticulosis in the left colon.                           - The examination was otherwise normal.                           - No specimens collected. Moderate Sedation:      N/A- Per Anesthesia Care Recommendation:           -  Return patient to hospital ward for ongoing care.                           - Patient has a contact number available for                            emergencies. The signs and symptoms of potential                            delayed complications were discussed with the                            patient. Return to normal activities tomorrow.                            Written discharge instructions were provided to the                            patient.                           - Resume previous diet.                           - Continue present medications.                           - No repeat colonoscopy due to age. Procedure Code(s):        --- Professional ---                           [phone], Colonoscopy, flexible; diagnostic, including                             collection of specimen(s) by brushing or washing,                            when performed (separate procedure) Diagnosis Code(s):        --- Professional ---                           D50.9, Iron deficiency anemia, unspecified                           K57.30, Diverticulosis of large intestine without                            perforation or abscess without bleeding CPT copyright 2016 [employer]. All rights reserved. The codes documented in this report are preliminary and upon coder review may  be revised to meet current compliance requirements. Milus Banister, MD Milus Banister, MD 06/04/2015 8:58:34 AM Number of Addenda: 0

## 2015-06-05 ENCOUNTER — Encounter (HOSPITAL_COMMUNITY): Payer: Self-pay | Admitting: Gastroenterology

## 2015-06-05 LAB — CBC
HCT: 29.9 % — ABNORMAL LOW (ref 39.0–52.0)
HEMOGLOBIN: 9 g/dL — AB (ref 13.0–17.0)
MCH: 23.4 pg — AB (ref 26.0–34.0)
MCHC: 30.1 g/dL (ref 30.0–36.0)
MCV: 77.7 fL — ABNORMAL LOW (ref 78.0–100.0)
PLATELETS: 280 10*3/uL (ref 150–400)
RBC: 3.85 MIL/uL — ABNORMAL LOW (ref 4.22–5.81)
RDW: 18.3 % — AB (ref 11.5–15.5)
WBC: 9.7 10*3/uL (ref 4.0–10.5)

## 2015-06-05 MED ORDER — RISAQUAD PO CAPS: 2.0000 | ORAL_CAPSULE | Freq: Every day | ORAL | Status: DC

## 2015-06-05 MED ORDER — FOSFOMYCIN TROMETHAMINE 3 G PO PACK
3.0000 g | PACK | Freq: Once | ORAL | Status: DC
  Filled 2015-06-05: qty 3

## 2015-06-05 MED ORDER — AMOXICILLIN 500 MG PO CAPS: 500.0000 mg | ORAL_CAPSULE | Freq: Three times a day (TID) | ORAL | Status: DC

## 2015-06-05 MED ORDER — FERROUS SULFATE 325 (65 FE) MG PO TABS: 325.0000 mg | ORAL_TABLET | Freq: Every day | ORAL | Status: AC

## 2015-06-05 MED ORDER — RISAQUAD PO CAPS
2.0000 | ORAL_CAPSULE | Freq: Every day | ORAL | Status: DC
Start: 2015-06-05 — End: 2015-06-05
  Filled 2015-06-05: qty 2

## 2015-06-05 MED ORDER — AMOXICILLIN 500 MG PO CAPS
500.0000 mg | ORAL_CAPSULE | Freq: Three times a day (TID) | ORAL | Status: DC
  Filled 2015-06-05 (×3): qty 1

## 2015-06-05 MED ORDER — FERROUS SULFATE 325 (65 FE) MG PO TABS: 325.0000 mg | ORAL_TABLET | Freq: Every day | ORAL | Status: DC

## 2015-06-05 MED ORDER — CYANOCOBALAMIN 1000 MCG/ML IJ SOLN: 1000.0000 ug | INTRAMUSCULAR | Status: DC

## 2015-06-05 NOTE — Care Management (Signed)
CM spoke with [place] at [facility] [phone]. Per Alfredo Bach they will set up home health services with [name]. They will contact the agency and do not need the orders faxed from CM. Presenter, broadcasting BSN CCM

## 2015-06-05 NOTE — Discharge Summary (Signed)
Michael Bradley, is a 82 y.o. male  DOB 11/26/1925  MRN [id number].  Admission date:  06/02/2015  Admitting Physician  Edwin Dada, MD  Discharge Date:  06/05/2015   Primary MD  Keene Danes, MD  Recommendations for primary care physician for things to follow:  - Please check CBC, BMP in 3 days. - Please follow the final results on urine sensitivity. - Need to continue with B-12 for B-12 deficiency - Started on oral iron supplement for iron deficiency anemia   Admission Diagnosis  UTI (lower urinary tract infection) [N39.0] Gastrointestinal hemorrhage, unspecified gastritis, unspecified gastrointestinal hemorrhage type [K92.2]   Discharge Diagnosis  UTI (lower urinary tract infection) [N39.0] Gastrointestinal hemorrhage, unspecified gastritis, unspecified gastrointestinal hemorrhage type [K92.2]    Principal Problem:   GI bleed Active Problems:   Dementia   Benign hypertensive heart disease without heart failure   Hypothyroid   Delirium   UTI (lower urinary tract infection)   Chronic blood loss anemia   Bleeding gastrointestinal      Past Medical History  Diagnosis Date  . Hypertension   . Hyperlipidemia 2013  . Chest pain, atypical   . Palpitations   . ED (erectile dysfunction)   . History of kidney stones   . 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  . Dementia 2011  . Hypothyroidism   . Microcytic anemia 12/2014  . Abnormal chest x-ray 06/2014    chronic stable bilateral interstitial opacities without focal consolidation    Past Surgical History  Procedure Laterality Date  . Colonoscopy  2009    Dr Delfin Edis: diverticulosis, hemorrhoids, small cecal polyp (tubular adenoma)  . Thoracotomy Right 1971    REMOVAL OF CARCINOID TUMOR  . Cardiovascular stress test  10/08/2002    EF 63%  . Lobectomy  1971  . Cataract extraction       os 2006-od 2010       History of present illness and  Hospital Course:     Kindly see H&P for history of present illness and admission details, please review complete Labs, Consult reports and Test reports for all details in brief  HPI  from the history and physical done on the day of admission 06/02/2015  HPI: Michael Bradley is a 82 y.o. male with a past medical history significant for dementia, hypothyroidism, and HTN who presents with anemia and confusion.  The patient was sent over from [place] today because of altered mental status. He had no cough, vomiting, diarrhea, hematochezia, melena, hematemesis, or fever, but seemed disoriented and uncooperative.  In the ED, he was afebrile and hemodynamically stable. Chest x-ray showed chronic stable bilateral interstitial opacities without focal consolidation, urinalysis showed full field WBCs and bacteria. Incidentally he was noted to have hemoglobin 7.7 (without patient endorsed or nursing home reported source of bleeding). He is not on a blood thinner, takes a baby aspirin but no other NSAIDs. He has no history  of GI bleeding. His last colonoscopy was in 2009, and he had hyperplastic polyps.  Ceftriaxone and fluids were administered, and TRH were asked to evaluate for admission.   Hospital Course  82 y.o. male past medical history significant for dementia, hypothyroidism, and HTN who presents from [place] for altered mental status, Significant for UTI and anemia, Hemoccult stool is positive, No evidence of overt bleeding, status post colonoscopy/endoscopy 3/18 with no evidence of source of bleed.  Anemia - Patient with chronic microcytic anemia, baseline hemoglobin is 9 , glycohemoglobin is 7.7 on admission, dropped to 6.6 after hydration , Hemoccult positive stool . - Anemia workup significant for iron deficiency anemia, and B-12 deficiency. - GI consult greatly appreciated, went for colonoscopy and  endoscopy 3/18, with no evidence of active bleed or findings to suggest source of bleed. - Continue with Protonix - Transfused 2 units PRBC since admission with good response, hemoglobin remained stable at 9.0 today. - B 12 deficiency,  started on B-12 supplement - Workup significant for iron deficiency, started on by mouth iron, received one dose of IV Feraheme 3/18 - Monitor CBC closely  Acute encephalopathy/confusion - This is most likely metabolic secondary to UTI, as well delirium with baseline dementia. - Resolved, back to baseline visit per His niece   UTI:  - Dictated initially with Rocephin, urine culture came back today for enterococcus, sensitivity pending at time of discharge, will be given 1 dose of  Fosfomycin, and will discharged on total of 10 days of by mouth amoxicillin.  Hypothyroidism  - Continue home levothyroxine  Dementia - Continue home galantamine, escitalopram  HTN  - Continue home terazosin, lisinopril, statin  GERD  - Continue home PPI    Discharge Condition:  Stable   Follow UP  Follow-up Information    Follow up with [facility] at [place].   Specialty:  Assisted Living Facility   Contact information:   [address] [place] [phone]      Follow up with Bishop Danes, MD. Schedule an appointment as soon as possible for a visit in 1 week.   Specialty:  Cardiology   Contact information:   [address] [place] [address] [phone]         Discharge Instructions  and  Discharge Medications    Discharge Instructions    Diet - low sodium heart healthy    Complete by:  As directed      Discharge instructions    Complete by:  As directed   Follow with Primary MD Prayan Danes, MD in 7 days   Get CBC, CMP,  checked  by Primary MD next visit.    Activity: As tolerated with Full fall precautions use walker/cane & assistance as needed   Disposition Home     Diet: Heart Healthy  , with feeding assistance and aspiration precautions.  For Heart failure patients - Check your Weight same time everyday, if you gain over 2 pounds, or you develop in leg swelling, experience more shortness of breath or chest pain, call your Primary MD immediately. Follow Cardiac Low Salt Diet and 1.5 lit/day fluid restriction.   On your next visit with your primary care physician please Get Medicines reviewed and adjusted.   Please request your Prim.MD to go over all Hospital Tests and Procedure/Radiological results at the follow up, please get all Hospital records sent to your Prim MD by signing hospital release before you go home.  If you experience worsening of your admission symptoms, develop shortness of breath, life threatening emergency, suicidal or homicidal thoughts you must seek medical attention immediately by calling 911 or calling your MD immediately  if symptoms less severe.  You Must read complete instructions/literature along with all the possible adverse reactions/side effects for all the Medicines you take and that have been prescribed to you. Take any new Medicines after you have completely understood and accpet all the possible adverse reactions/side effects.   Do not drive, operating heavy machinery, perform activities at heights, swimming or participation in water activities or provide baby sitting services if your were admitted for syncope or siezures until you have seen by Primary MD or a Neurologist and advised to do so again.  Do not drive when taking Pain medications.    Do not take more than prescribed Pain, Sleep and Anxiety Medications  Special Instructions: If you have smoked or chewed Tobacco  in the last 2 yrs please stop smoking, stop any regular Alcohol  and or any Recreational drug use.  Wear Seat belts while driving.   Please note  You were cared for by a hospitalist during your hospital stay. If you have any questions  about your discharge medications or the care you received while you were in the hospital after you are discharged, you can call the unit and asked to speak with the hospitalist on call if the hospitalist that took care of you is not available. Once you are discharged, your primary care physician will handle any further medical issues. Please note that NO REFILLS for any discharge medications will be authorized once you are discharged, as it is imperative that you return to your primary care physician (or establish a relationship with a primary care physician if you do not have one) for your aftercare needs so that they can reassess your need for medications and monitor your lab values.     Increase activity slowly    Complete by:  As directed             Medication List    TAKE these medications        acetaminophen 325 MG tablet  Commonly known as:  TYLENOL  Take 325-650 mg by mouth every 6 (six) hours as needed for mild pain, moderate pain or headache.     acidophilus Caps capsule  Take 2 capsules by mouth daily.     amoxicillin 500 MG capsule  Commonly known as:  AMOXIL  Take 1 capsule (500 mg total) by mouth every 8 (eight) hours. Please take for 10 days.     aspirin 81 MG tablet  Take 81 mg by mouth daily.     atorvastatin 80 MG tablet  Commonly known as:  LIPITOR  Take 0.5 tablets (40 mg total) by mouth daily.     chlorpheniramine 4 MG tablet  Commonly known as:  CHLOR-TRIMETON  Take 4 mg by mouth 2 (two) times daily as needed for allergies.     cyanocobalamin 1000 MCG/ML injection  Commonly known as:  (VITAMIN B-12)  Inject 1 mL (1,000 mcg total) into the muscle every 30 (thirty) days.     escitalopram 5 MG tablet  Commonly known as:  LEXAPRO  Take 5 mg by mouth daily.     ferrous sulfate 325 (65 FE) MG tablet  Take 1 tablet (325 mg total) by mouth daily with breakfast.     furosemide 20 MG tablet  Commonly known as:  LASIX  Take 1 tablet (20 mg total) by mouth  daily.     galantamine 24 MG 24 hr capsule  Commonly known as:  RAZADYNE ER  Take 1 capsule (24 mg total) by mouth daily with breakfast.     levothyroxine 25 MCG tablet  Commonly known as:  SYNTHROID, LEVOTHROID  TAKE 1 TABLET ONCE DAILY EXCEPT 2 TABLETS ON WEDNESDAY AND ON SUNDAY.     lisinopril 20 MG tablet  Commonly known as:  PRINIVIL,ZESTRIL  Take 10 mg by mouth daily.     omeprazole 20 MG capsule  Commonly known as:  PRILOSEC  Take 20 mg by mouth at bedtime.     terazosin 2 MG capsule  Commonly known as:  HYTRIN  Take 4 mg by mouth at bedtime.          Diet and Activity recommendation: See Discharge Instructions above   Consults obtained -  GI   Major procedures and Radiology Reports - PLEASE review detailed and final reports for all details, in brief -   Colonoscopy and EGD 3/18   Dg Chest 2 View  06/02/2015  CLINICAL DATA:  Altered mental status EXAM: CHEST  2 VIEW COMPARISON:  07/02/2014 FINDINGS: Heart remains mildly enlarged. Diffuse interstitial prominence and hazy airspace disease throughout both lungs. Right hemidiaphragm remains elevated. Small right pleural effusion is suspected. Pulmonary vasculature is indistinct. No left pleural effusion. No pneumothorax. Stable thoracic spine. IMPRESSION: The above findings are worrisome for pulmonary edema and CHF. There is chronic elevation of the right hemidiaphragm. Electronically Signed   By: Marybelle Killings M.D.   On: 06/02/2015 18:24    Micro Results    Recent Results (from the past 240 hour(s))  Urine culture     Status: None (Preliminary result)   Collection Time: 06/02/15  6:43 PM  Result Value Ref Range Status   Specimen Description URINE, CLEAN CATCH  Final   Special Requests NONE  Final   Culture   Final    >=100,000 COLONIES/mL ENTEROCOCCUS SPECIES SUSCEPTIBILITIES TO FOLLOW Performed at [facility]    Report Status PENDING  Incomplete       Today   Subjective:   Michael Bradley  today has no headache,no chest or abdominal pain,no new weakness tingling or numbness, feels much better wants to go home today.   Objective:   Blood pressure 148/80, pulse 63, temperature 98.1 F (36.7 C), temperature source Oral, resp. rate 16, height 5\' 6"  (1.676 m), weight 74.844 kg (165 lb), SpO2 94 %.   Intake/Output Summary (Last 24 hours) at 06/05/15 1226 Last data filed at 06/05/15 0600  Gross per 24 hour  Intake    660 ml  Output   1051 ml  Net   -391 ml    Exam Awake Alert, demented, pleasant [place].AT,PERRAL Supple Neck,No JVD, No cervical lymphadenopathy appriciated.  Symmetrical Chest wall movement, Good air movement bilaterally, CTAB RRR,No Gallops,Rubs or new Murmurs, No Parasternal Heave +ve B.Sounds, Abd Soft, No tenderness, No organomegaly appriciated, No rebound - guarding or rigidity. No Cyanosis, Clubbing or edema, No new Rash or bruise   Data Review   CBC w Diff: Lab Results  Component Value Date   WBC 9.7 06/05/2015   HGB 9.0* 06/05/2015   HCT 29.9* 06/05/2015   PLT 280 06/05/2015   LYMPHOPCT 27 12/29/2014   MONOPCT 15* 12/29/2014   EOSPCT 4 12/29/2014   BASOPCT 1 12/29/2014    CMP: Lab Results  Component Value Date   NA 143  06/04/2015   K 4.1 06/04/2015   CL 113* 06/04/2015   CO2 21* 06/04/2015   BUN 14 06/04/2015   CREATININE 1.28* 06/04/2015   CREATININE 1.30* 12/29/2014   PROT 7.5 06/02/2015   ALBUMIN 4.2 06/02/2015   BILITOT 0.5 06/02/2015   ALKPHOS 78 06/02/2015   AST 21 06/02/2015   ALT 15* 06/02/2015  .   Total Time in preparing paper work, data evaluation and todays exam - 35 minutes  Daryle Boyington M.D on 06/05/2015 at 12:26 PM  [facility]   Office  [phone]

## 2015-06-05 NOTE — Discharge Instructions (Signed)
Follow with Primary MD Dontrell Danes, MD in 7 days   Get CBC, CMP,  checked  by Primary MD next visit.    Activity: As tolerated with Full fall precautions use walker/cane & assistance as needed   Disposition Home    Diet: Heart Healthy  , with feeding assistance and aspiration precautions.  For Heart failure patients - Check your Weight same time everyday, if you gain over 2 pounds, or you develop in leg swelling, experience more shortness of breath or chest pain, call your Primary MD immediately. Follow Cardiac Low Salt Diet and 1.5 lit/day fluid restriction.   On your next visit with your primary care physician please Get Medicines reviewed and adjusted.   Please request your Prim.MD to go over all Hospital Tests and Procedure/Radiological results at the follow up, please get all Hospital records sent to your Prim MD by signing hospital release before you go home.   If you experience worsening of your admission symptoms, develop shortness of breath, life threatening emergency, suicidal or homicidal thoughts you must seek medical attention immediately by calling 911 or calling your MD immediately  if symptoms less severe.  You Must read complete instructions/literature along with all the possible adverse reactions/side effects for all the Medicines you take and that have been prescribed to you. Take any new Medicines after you have completely understood and accpet all the possible adverse reactions/side effects.   Do not drive, operating heavy machinery, perform activities at heights, swimming or participation in water activities or provide baby sitting services if your were admitted for syncope or siezures until you have seen by Primary MD or a Neurologist and advised to do so again.  Do not drive when taking Pain medications.    Do not take more than prescribed Pain, Sleep and Anxiety Medications  Special Instructions: If you have smoked or chewed Tobacco  in the last 2 yrs  please stop smoking, stop any regular Alcohol  and or any Recreational drug use.  Wear Seat belts while driving.   Please note  You were cared for by a hospitalist during your hospital stay. If you have any questions about your discharge medications or the care you received while you were in the hospital after you are discharged, you can call the unit and asked to speak with the hospitalist on call if the hospitalist that took care of you is not available. Once you are discharged, your primary care physician will handle any further medical issues. Please note that NO REFILLS for any discharge medications will be authorized once you are discharged, as it is imperative that you return to your primary care physician (or establish a relationship with a primary care physician if you do not have one) for your aftercare needs so that they can reassess your need for medications and monitor your lab values.

## 2015-06-05 NOTE — NC FL2 (Signed)
[place] LEVEL OF CARE SCREENING TOOL     IDENTIFICATION  Patient Name: Michael Bradley Birthdate: October 26, 1925 Sex: male Admission Date (Current Location): 06/02/2015  [facility] and [place] Number:  Herbalist and Address:  [facility],  [place] [address], [place]      Provider Number: [id number]  Attending Physician Name and Address:  Albertine Patricia, MD  Relative Name and Phone Number:       Current Level of Care: Hospital Recommended Level of Care: [place] Prior Approval Number:    Date Approved/Denied:   PASRR Number:    Discharge Plan: Other (Comment) (ALF)    Current Diagnoses: Patient Active Problem List   Diagnosis Date Noted  . Bleeding gastrointestinal   . GI bleed 06/02/2015  . Delirium 06/02/2015  . UTI (lower urinary tract infection) 06/02/2015  . Chronic blood loss anemia 06/02/2015  . Hypothyroid 08/16/2011  . Vertigo 08/16/2011  . Dementia 12/07/2010  . Hypercholesterolemia 12/07/2010  . Benign hypertensive heart disease without heart failure 12/07/2010  . 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 Dyspnea on exertion 12/07/2010    Orientation RESPIRATION BLADDER Height & Weight     Self  Normal Continent Weight: 74.844 kg (165 lb) Height:  5\' 6"  (167.6 cm)  BEHAVIORAL SYMPTOMS/MOOD NEUROLOGICAL BOWEL NUTRITION STATUS  Other (Comment) (no behaviors)   Continent Diet  AMBULATORY STATUS COMMUNICATION OF NEEDS Skin   Supervision Verbally Normal                       Personal Care Assistance Level of Assistance  Bathing, Feeding, Dressing Bathing Assistance: Limited assistance Feeding assistance: Independent Dressing Assistance: Limited assistance     Functional Limitations Info  Sight, Hearing, Speech Sight Info: Impaired Hearing Info: Impaired Speech Info: Adequate    SPECIAL CARE FACTORS FREQUENCY                       Contractures Contractures Info: Not  present    Additional Factors Info  Code Status Code Status Info: DNR             Current Medications (06/05/2015):  This is the current hospital active medication list Current Facility-Administered Medications  Medication Dose Route Frequency Provider Last Rate Last Dose  . 0.9 %  sodium chloride infusion   Intravenous Once Dianne Dun, NP      . 0.9 %  sodium chloride infusion   Intravenous Continuous Jessica D Zehr, PA-C      . 0.9 %  sodium chloride infusion   Intravenous Once Albertine Patricia, MD      . acetaminophen (TYLENOL) tablet 325-650 mg  325-650 mg Oral Q6H PRN Edwin Dada, MD   650 mg at 06/04/15 2005  . acidophilus (RISAQUAD) capsule 2 capsule  2 capsule Oral Daily Dawood S Elgergawy, MD      . amoxicillin (AMOXIL) capsule 500 mg  500 mg Oral 3 times per day Albertine Patricia, MD      . aspirin EC tablet 81 mg  81 mg Oral Daily Albertine Patricia, MD   81 mg at 06/05/15 1106  . atorvastatin (LIPITOR) tablet 40 mg  40 mg Oral q1800 Edwin Dada, MD   40 mg at 06/04/15 1823  . cyanocobalamin ((VITAMIN B-12)) injection 1,000 mcg  1,000 mcg Intramuscular Daily Albertine Patricia, MD   1,000 mcg at 06/05/15 1107  .  escitalopram (LEXAPRO) tablet 5 mg  5 mg Oral Daily Edwin Dada, MD   5 mg at 06/05/15 1106  . ferrous sulfate tablet 325 mg  325 mg Oral Q breakfast Milus Banister, MD   325 mg at 06/05/15 1106  . fosfomycin (MONUROL) packet 3 g  3 g Oral Once Albertine Patricia, MD      . furosemide (LASIX) tablet 20 mg  20 mg Oral Daily Edwin Dada, MD   20 mg at 06/05/15 1106  . galantamine (RAZADYNE) tablet 12 mg  12 mg Oral BID Edwin Dada, MD   12 mg at 06/05/15 1106  . levothyroxine (SYNTHROID, LEVOTHROID) tablet 25 mcg  25 mcg Oral QAC breakfast Edwin Dada, MD   25 mcg at 06/05/15 0840  . lisinopril (PRINIVIL,ZESTRIL) tablet 10 mg  10 mg Oral Daily Edwin Dada, MD   10 mg at 06/05/15 1107   . pantoprazole (PROTONIX) EC tablet 40 mg  40 mg Oral Daily Edwin Dada, MD   40 mg at 06/05/15 1106  . terazosin (HYTRIN) capsule 4 mg  4 mg Oral QHS Edwin Dada, MD   4 mg at 06/04/15 2200     Discharge Medications: Please see discharge summary for a list of discharge medications.   Medication List    TAKE these medications       acetaminophen 325 MG tablet  Commonly known as: TYLENOL  Take 325-650 mg by mouth every 6 (six) hours as needed for mild pain, moderate pain or headache.     acidophilus Caps capsule  Take 2 capsules by mouth daily.     amoxicillin 500 MG capsule  Commonly known as: AMOXIL  Take 1 capsule (500 mg total) by mouth every 8 (eight) hours. Please take for 10 days.     aspirin 81 MG tablet  Take 81 mg by mouth daily.     atorvastatin 80 MG tablet  Commonly known as: LIPITOR  Take 0.5 tablets (40 mg total) by mouth daily.     chlorpheniramine 4 MG tablet  Commonly known as: CHLOR-TRIMETON  Take 4 mg by mouth 2 (two) times daily as needed for allergies.     cyanocobalamin 1000 MCG/ML injection  Commonly known as: (VITAMIN B-12)  Inject 1 mL (1,000 mcg total) into the muscle every 30 (thirty) days.     escitalopram 5 MG tablet  Commonly known as: LEXAPRO  Take 5 mg by mouth daily.     ferrous sulfate 325 (65 FE) MG tablet  Take 1 tablet (325 mg total) by mouth daily with breakfast.     furosemide 20 MG tablet  Commonly known as: LASIX  Take 1 tablet (20 mg total) by mouth daily.     galantamine 24 MG 24 hr capsule  Commonly known as: RAZADYNE ER  Take 1 capsule (24 mg total) by mouth daily with breakfast.     levothyroxine 25 MCG tablet  Commonly known as: SYNTHROID, LEVOTHROID  TAKE 1 TABLET ONCE DAILY EXCEPT 2 TABLETS ON WEDNESDAY AND ON SUNDAY.     lisinopril 20 MG tablet  Commonly known as: PRINIVIL,ZESTRIL  Take 10 mg by mouth daily.      omeprazole 20 MG capsule  Commonly known as: PRILOSEC  Take 20 mg by mouth at bedtime.     terazosin 2 MG capsule  Commonly known as: HYTRIN  Take 4 mg by mouth at bedtime.  Relevant Imaging Results:  Relevant Lab Results:   Additional Information SS # [id number]  Mirriam Vadala, Randall An, LCSW

## 2015-06-05 NOTE — Progress Notes (Addendum)
Pt is ready for d/c back to [facility]. Family requesting to transport. D/C Summary and FL2 sent to ALF for review prior to d/c. RNCM assisted with arranging [facility] services. Family will fill new scripts. D/C packet provided to family. # for report provided to nsg. IVC was rescinded prior to d/c.  Werner Lean LCSW [phone]

## 2015-06-06 LAB — TYPE AND SCREEN
ABO/RH(D): O POS
ANTIBODY SCREEN: NEGATIVE
UNIT DIVISION: 0
Unit division: 0

## 2015-06-06 LAB — FOLATE RBC
FOLATE, HEMOLYSATE: 274.5 ng/mL
Folate, RBC: 1116 ng/mL (ref >498)
Hematocrit: 24.6 % — ABNORMAL LOW (ref 37.5–51.0)

## 2015-06-07 DIAGNOSIS — R296 Repeated falls: Secondary | ICD-10-CM | POA: Diagnosis not present

## 2015-06-07 DIAGNOSIS — F0390 Unspecified dementia without behavioral disturbance: Secondary | ICD-10-CM | POA: Diagnosis not present

## 2015-06-07 DIAGNOSIS — I119 Hypertensive heart disease without heart failure: Secondary | ICD-10-CM | POA: Diagnosis not present

## 2015-06-07 DIAGNOSIS — Z7982 Long term (current) use of aspirin: Secondary | ICD-10-CM | POA: Diagnosis not present

## 2015-06-07 DIAGNOSIS — N390 Urinary tract infection, site not specified: Secondary | ICD-10-CM | POA: Diagnosis not present

## 2015-06-07 DIAGNOSIS — H3530 Unspecified macular degeneration: Secondary | ICD-10-CM | POA: Diagnosis not present

## 2015-06-09 DIAGNOSIS — F0390 Unspecified dementia without behavioral disturbance: Secondary | ICD-10-CM | POA: Diagnosis not present

## 2015-06-09 DIAGNOSIS — I119 Hypertensive heart disease without heart failure: Secondary | ICD-10-CM | POA: Diagnosis not present

## 2015-06-09 DIAGNOSIS — R296 Repeated falls: Secondary | ICD-10-CM | POA: Diagnosis not present

## 2015-06-09 DIAGNOSIS — H3530 Unspecified macular degeneration: Secondary | ICD-10-CM | POA: Diagnosis not present

## 2015-06-09 DIAGNOSIS — N390 Urinary tract infection, site not specified: Secondary | ICD-10-CM | POA: Diagnosis not present

## 2015-06-09 DIAGNOSIS — Z7982 Long term (current) use of aspirin: Secondary | ICD-10-CM | POA: Diagnosis not present

## 2015-06-10 ENCOUNTER — Telehealth: Payer: Self-pay | Admitting: Cardiology

## 2015-06-10 DIAGNOSIS — D500 Iron deficiency anemia secondary to blood loss (chronic): Secondary | ICD-10-CM

## 2015-06-10 NOTE — Telephone Encounter (Signed)
Patient discharged from hospital 06/05/15 was instructed on written discharge instructions to see Dr. Mare Ferrari named as primary care doctor in one week and to repeat blood work (CBC,BMP) in 3 days because of low hgb/ hct, elevated creatinine, low Cl  in the hospital.  Dr. Mare Ferrari is retired and no post hospital follow up  Appointments have been made.  The niece said that he has an appointment in May with his PCP Dr. Romero Liner at the [facility] but will call to see if she can get the appointment moved up.  An appointment has been made with Richardson Dopp for Tuesday 06/14/2015 at 1130am for post hospital follow up    [facility] with [name] to tell her PT order will need to come from his PCP, Dr. Romero Liner at the [facility]

## 2015-06-10 NOTE — Telephone Encounter (Signed)
New Message  Michael Bradley with [name] called states that the pt was evaluated for physical therapy they would like to have verbal orders for Physical therapy twice a week for 6 weeks. Beginning next week 06/13/2015. Please call back to discuss.

## 2015-06-13 NOTE — Progress Notes (Signed)
Cardiology Office Note:    Date:  06/14/2015   ID:  [name], DOB [birth year], MRN [id number]  PCP:  Dr. Romero Liner at [facility]  Cardiologist:  Dr. Darlin Coco  >> rec was for prn FU Electrophysiologist:  n/a  Chief Complaint  Patient presents with  . Hospitalization Follow-up    UTI; worsening anemia    History of Present Illness:     Michael Bradley is a 82 y.o. 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  Lives at [facility] in memory care unit.  Last seen by Dr. Darlin Coco in 10/16.  Plan was for prn FU after his retirement.   Admitted 3/16-3/19 with confusion in the setting of UTI and worsening anemia.  Hgb 6.6 on admit.  Stool heme +.  EGD and Colo with no source of bleeding. He was placed on PPI and transfused with 2 units PRBCs.  He was noted to be B12 deficient and started on B12 therapy.  He was also iron deficient and started on iron.  Close FU was recommended with PCP to check CBC and BMET.  However, he was scheduled here for cardiology follow up.  Here with his niece.  She helps with his hx given dementia.  He is weak and needs help with ADLs now. Denies chest pain, dyspnea, syncope, orthopnea, PND, edema.  Denies any bleeding.     Past Medical History  Diagnosis Date  . Hypertension   . Hyperlipidemia 2013  . Chest pain, atypical   . Palpitations   . ED (erectile dysfunction)   . History of kidney stones   . 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  . Dementia 2011  . Hypothyroidism   . Microcytic anemia 12/2014  . Abnormal chest x-ray 06/2014    chronic stable bilateral interstitial opacities without focal consolidation    Past Surgical History  Procedure Laterality Date  . Colonoscopy  2009    Dr Delfin Edis: diverticulosis, hemorrhoids, small cecal polyp (tubular adenoma)  . Thoracotomy Right 1971   REMOVAL OF CARCINOID TUMOR  . Cardiovascular stress test  10/08/2002    EF 63%  . Lobectomy  1971  . Cataract extraction      os 2006-od 2010  . Esophagogastroduodenoscopy N/A 06/04/2015    Procedure: ESOPHAGOGASTRODUODENOSCOPY (EGD);  Surgeon: Milus Banister, MD;  Location: [facility];  Service: Endoscopy;  Laterality: N/A;  . Colonoscopy N/A 06/04/2015    Procedure: COLONOSCOPY;  Surgeon: Milus Banister, MD;  Location: [facility];  Service: Endoscopy;  Laterality: N/A;    Current Medications: Outpatient Prescriptions Prior to Visit  Medication Sig Dispense Refill  . acetaminophen (TYLENOL) 325 MG tablet Take 325-650 mg by mouth every 6 (six) hours as needed for mild pain, moderate pain or headache.     Marland Kitchen acidophilus (RISAQUAD) CAPS capsule Take 2 capsules by mouth daily. 30 capsule 0  . amoxicillin (AMOXIL) 500 MG capsule Take 1 capsule (500 mg total) by mouth every 8 (eight) hours. Please take for 10 days. 30 capsule 0  . aspirin 81 MG tablet Take 81 mg by mouth daily.      Marland Kitchen atorvastatin (LIPITOR) 80 MG tablet Take 0.5 tablets (40 mg total) by mouth daily. 45 tablet 2  . chlorpheniramine (CHLOR-TRIMETON) 4 MG tablet Take 4 mg by mouth 2 (two)  times daily as needed for allergies.    . cyanocobalamin (,VITAMIN B-12,) 1000 MCG/ML injection Inject 1 mL (1,000 mcg total) into the muscle every 30 (thirty) days. 1 mL 3  . escitalopram (LEXAPRO) 5 MG tablet Take 5 mg by mouth daily.    . ferrous sulfate 325 (65 FE) MG tablet Take 1 tablet (325 mg total) by mouth daily with breakfast. 30 tablet 3  . furosemide (LASIX) 20 MG tablet Take 1 tablet (20 mg total) by mouth daily. 30 tablet 5  . galantamine (RAZADYNE ER) 24 MG 24 hr capsule Take 1 capsule (24 mg total) by mouth daily with breakfast. 90 capsule 3  . levothyroxine (SYNTHROID, LEVOTHROID) 25 MCG tablet TAKE 1 TABLET ONCE DAILY EXCEPT 2 TABLETS ON WEDNESDAY AND ON SUNDAY. 39 tablet 5  . lisinopril (PRINIVIL,ZESTRIL) 20 MG tablet Take  10 mg by mouth daily.      Marland Kitchen omeprazole (PRILOSEC) 20 MG capsule Take 20 mg by mouth at bedtime.     Marland Kitchen terazosin (HYTRIN) 2 MG capsule Take 4 mg by mouth at bedtime.     No facility-administered medications prior to visit.     Allergies:   Lisinopril and Quinolones   Social History   Social History  . Marital Status: Married    Spouse Name: N/A  . Number of Children: 0  . Years of Education: 12   Occupational History  .     Social History Main Topics  . Smoking status: Former Smoker    Quit date: 11/28/1980  . Smokeless tobacco: Never Used  . Alcohol Use: No  . Drug Use: No  . Sexual Activity: Not Asked   Other Topics Concern  . None   Social History Narrative   Pt lives at home with his spouse.   Caffeine Use: 2 cups of caffeine per day.      Family History:  The patient's family history includes Emphysema in his father; Heart attack in his brother; Kidney cancer in his brother; Leukemia in his mother; Lung cancer in his brother and father.   ROS:   Please see the history of present illness.    Review of Systems  Constitution: Positive for decreased appetite and malaise/fatigue.  Eyes: Positive for visual disturbance.  All other systems reviewed and are negative.   Physical Exam:    VS:  BP 110/52 mmHg  Pulse 58  Ht 5\' 6"  (1.676 m)  Wt 156 lb 9.6 oz (71.033 kg)  BMI 25.29 kg/m2   GEN: Well nourished, well developed, in no acute distress HEENT: normal Neck: no JVD, no masses Cardiac: Normal 99991111, RRR; 2/6 systolic murmur RUSB, rubs, or gallops, no edema;     Respiratory: decreased breath sounds RML, no rales bilaterally; no wheezing, rhonchi   GI: soft, nontender, nondistended MS: no deformity or atrophy Skin: warm and dry Neuro: No focal deficits  Psych: Alert and oriented x 3, normal affect  Wt Readings from Last 3 Encounters:  06/14/15 156 lb 9.6 oz (71.033 kg)  06/02/15 165 lb (74.844 kg)  04/27/15 165 lb (74.844 kg)      Studies/Labs  Reviewed:     EKG:  EKG is  ordered today.  The ekg ordered today demonstrates sinus brady, HR 57, LAD, NSSTTW changes, QTc 426 ms  Recent Labs: 06/02/2015: ALT 15*; B Natriuretic Peptide 158.5*; TSH 3.493 06/14/2015: BUN 21; Creat 1.43*; Hemoglobin 11.0*; Platelets 295; Potassium 3.9; Sodium 141   Recent Lipid Panel    Component Value Date/Time  CHOL 129 12/29/2014 0832   TRIG 99 12/29/2014 0832   HDL 33* 12/29/2014 0832   CHOLHDL 3.9 12/29/2014 0832   VLDL 20 12/29/2014 0832   LDLCALC 76 12/29/2014 0832   LDLDIRECT 92.0 02/23/2013 1202    Additional studies/ records that were reviewed today include:   Nuclear study 7/04 No ischemia, EF 63%   ASSESSMENT:     1. Chronic blood loss anemia   2. Hypercholesterolemia   3. Dementia, without behavioral disturbance   4. Benign hypertensive heart disease without heart failure   5. Murmur     PLAN:     In order of problems listed above:  1. Anemia - Heme + with unremarkable colo and EGD.  He was transfused with 2u PRBCs.  He needs FU with primary care. However, the [place] cannot see him until May.  So he scheduled this appointment with me today.  We have repeated his CBC and BMET.  We have left a message at the [place] to get him in sooner than May.    2. HL - LDL was 76 in 10/16.  Continue statin.  FU with PCP.    3. Dementia - He remains in memory unit at [facility].  His niece was worried that the recent increase in Namenda caused his anemia. I doubt this as it is not listed as a potential adverse reaction. I encouraged him to FU with his Neurologist.    4. HTN - Controlled. FU with PCP.  5. Murmur - No echo in his chart.  Doubt this is significant AS.  Will get Echo. If echo ok, FU with cardiology prn.   Medication Adjustments/Labs and Tests Ordered: Current medicines are reviewed at length with the patient today.  Concerns regarding medicines are outlined above.  Medication changes, Labs and Tests ordered today are outlined  in the Patient Instructions noted below. Patient Instructions  Medication Instructions:  Your physician recommends that you continue on your current medications as directed. Please refer to the Current Medication list given to you today.  Labwork: BMET, CBC ALREADY DONE TODAY  Testing/Procedures: Your physician has requested that you have an echocardiogram. Echocardiography is a painless test that uses sound waves to create images of your heart. It provides your doctor with information about the size and shape of your heart and how well your heart's chambers and valves are working. This procedure takes approximately one hour. There are no restrictions for this procedure.  Follow-Up: AS NEEDED  Any Other Special Instructions Will Be Listed Below (If Applicable).  If you need a refill on your cardiac medications before your next appointment, please call your pharmacy.    Signed, Richardson Dopp, PA-C  06/14/2015 5:06 PM    [facility] [address], [place], [place]  [address] Phone: [phone]; Fax: [phone]

## 2015-06-14 ENCOUNTER — Other Ambulatory Visit (INDEPENDENT_AMBULATORY_CARE_PROVIDER_SITE_OTHER): Payer: Medicare Other | Admitting: *Deleted

## 2015-06-14 ENCOUNTER — Encounter: Payer: Self-pay | Admitting: Physician Assistant

## 2015-06-14 ENCOUNTER — Ambulatory Visit (INDEPENDENT_AMBULATORY_CARE_PROVIDER_SITE_OTHER): Payer: Medicare Other | Admitting: Physician Assistant

## 2015-06-14 VITALS — BP 110/52 | HR 58 | Ht 66.0 in | Wt 156.6 lb

## 2015-06-14 DIAGNOSIS — D500 Iron deficiency anemia secondary to blood loss (chronic): Secondary | ICD-10-CM

## 2015-06-14 DIAGNOSIS — R011 Cardiac murmur, unspecified: Secondary | ICD-10-CM

## 2015-06-14 DIAGNOSIS — I119 Hypertensive heart disease without heart failure: Secondary | ICD-10-CM

## 2015-06-14 DIAGNOSIS — E7800 Pure hypercholesterolemia, unspecified: Secondary | ICD-10-CM

## 2015-06-14 DIAGNOSIS — F0390 Unspecified dementia without behavioral disturbance: Secondary | ICD-10-CM

## 2015-06-14 LAB — CBC
HCT: 34.6 % — ABNORMAL LOW (ref 39.0–52.0)
Hemoglobin: 11 g/dL — ABNORMAL LOW (ref 13.0–17.0)
MCH: 25.1 pg — AB (ref 26.0–34.0)
MCHC: 31.8 g/dL (ref 30.0–36.0)
MCV: 79 fL (ref 78.0–100.0)
MPV: 9.3 fL (ref 8.6–12.4)
PLATELETS: 295 10*3/uL (ref 150–400)
RBC: 4.38 MIL/uL (ref 4.22–5.81)
RDW: 22.7 % — AB (ref 11.5–15.5)
WBC: 7 10*3/uL (ref 4.0–10.5)

## 2015-06-14 LAB — BASIC METABOLIC PANEL
BUN: 21 mg/dL (ref 7–25)
CALCIUM: 9.2 mg/dL (ref 8.6–10.3)
CO2: 24 mmol/L (ref 20–31)
CREATININE: 1.43 mg/dL — AB (ref 0.70–1.11)
Chloride: 109 mmol/L (ref 98–110)
Glucose, Bld: 113 mg/dL — ABNORMAL HIGH (ref 65–99)
Potassium: 3.9 mmol/L (ref 3.5–5.3)
Sodium: 141 mmol/L (ref 135–146)

## 2015-06-14 LAB — URINE CULTURE

## 2015-06-14 NOTE — Patient Instructions (Addendum)
Medication Instructions:  Your physician recommends that you continue on your current medications as directed. Please refer to the Current Medication list given to you today.  Labwork: BMET, CBC ALREADY DONE TODAY  Testing/Procedures: Your physician has requested that you have an echocardiogram. Echocardiography is a painless test that uses sound waves to create images of your heart. It provides your doctor with information about the size and shape of your heart and how well your heart's chambers and valves are working. This procedure takes approximately one hour. There are no restrictions for this procedure.  Follow-Up: AS NEEDED  Any Other Special Instructions Will Be Listed Below (If Applicable).  If you need a refill on your cardiac medications before your next appointment, please call your pharmacy.

## 2015-06-15 ENCOUNTER — Telehealth: Payer: Self-pay | Admitting: *Deleted

## 2015-06-15 DIAGNOSIS — I119 Hypertensive heart disease without heart failure: Secondary | ICD-10-CM

## 2015-06-15 DIAGNOSIS — H3530 Unspecified macular degeneration: Secondary | ICD-10-CM | POA: Diagnosis not present

## 2015-06-15 DIAGNOSIS — D500 Iron deficiency anemia secondary to blood loss (chronic): Secondary | ICD-10-CM

## 2015-06-15 DIAGNOSIS — N390 Urinary tract infection, site not specified: Secondary | ICD-10-CM | POA: Diagnosis not present

## 2015-06-15 DIAGNOSIS — Z7982 Long term (current) use of aspirin: Secondary | ICD-10-CM | POA: Diagnosis not present

## 2015-06-15 DIAGNOSIS — F0390 Unspecified dementia without behavioral disturbance: Secondary | ICD-10-CM | POA: Diagnosis not present

## 2015-06-15 DIAGNOSIS — R296 Repeated falls: Secondary | ICD-10-CM | POA: Diagnosis not present

## 2015-06-15 NOTE — Addendum Note (Signed)
Addended by: Michae Kava on: 06/15/2015 08:53 AM   Modules accepted: Orders

## 2015-06-15 NOTE — Telephone Encounter (Signed)
DPR on file for neice [name] who has been notified of lab results and recommendations. She also asked if I would let [facility] know of recommendations.

## 2015-06-21 DIAGNOSIS — I119 Hypertensive heart disease without heart failure: Secondary | ICD-10-CM | POA: Diagnosis not present

## 2015-06-21 DIAGNOSIS — Z7982 Long term (current) use of aspirin: Secondary | ICD-10-CM | POA: Diagnosis not present

## 2015-06-21 DIAGNOSIS — R296 Repeated falls: Secondary | ICD-10-CM | POA: Diagnosis not present

## 2015-06-21 DIAGNOSIS — F0390 Unspecified dementia without behavioral disturbance: Secondary | ICD-10-CM | POA: Diagnosis not present

## 2015-06-21 DIAGNOSIS — N390 Urinary tract infection, site not specified: Secondary | ICD-10-CM | POA: Diagnosis not present

## 2015-06-21 DIAGNOSIS — H3530 Unspecified macular degeneration: Secondary | ICD-10-CM | POA: Diagnosis not present

## 2015-06-22 ENCOUNTER — Other Ambulatory Visit (INDEPENDENT_AMBULATORY_CARE_PROVIDER_SITE_OTHER): Payer: Medicare Other | Admitting: *Deleted

## 2015-06-22 DIAGNOSIS — I119 Hypertensive heart disease without heart failure: Secondary | ICD-10-CM | POA: Diagnosis not present

## 2015-06-22 DIAGNOSIS — D500 Iron deficiency anemia secondary to blood loss (chronic): Secondary | ICD-10-CM | POA: Diagnosis not present

## 2015-06-22 LAB — BASIC METABOLIC PANEL
BUN: 19 mg/dL (ref 7–25)
CHLORIDE: 108 mmol/L (ref 98–110)
CO2: 25 mmol/L (ref 20–31)
Calcium: 9.1 mg/dL (ref 8.6–10.3)
Creat: 1.37 mg/dL — ABNORMAL HIGH (ref 0.70–1.11)
Glucose, Bld: 58 mg/dL — ABNORMAL LOW (ref 65–99)
POTASSIUM: 3.7 mmol/L (ref 3.5–5.3)
Sodium: 141 mmol/L (ref 135–146)

## 2015-06-22 LAB — CBC WITH DIFFERENTIAL/PLATELET
BASOS PCT: 1 %
Basophils Absolute: 70 cells/uL (ref 0–200)
EOS ABS: 70 {cells}/uL (ref 15–500)
EOS PCT: 1 %
HCT: 34.2 % — ABNORMAL LOW (ref 38.5–50.0)
Hemoglobin: 11.1 g/dL — ABNORMAL LOW (ref 13.2–17.1)
LYMPHS PCT: 36 %
Lymphs Abs: 2520 cells/uL (ref 850–3900)
MCH: 26.1 pg — ABNORMAL LOW (ref 27.0–33.0)
MCHC: 32.5 g/dL (ref 32.0–36.0)
MCV: 80.3 fL (ref 80.0–100.0)
MONOS PCT: 16 %
MPV: 9.8 fL (ref 7.5–12.5)
Monocytes Absolute: 1120 cells/uL — ABNORMAL HIGH (ref 200–950)
NEUTROS ABS: 3220 {cells}/uL (ref 1500–7800)
Neutrophils Relative %: 46 %
PLATELETS: 316 10*3/uL (ref 140–400)
RBC: 4.26 MIL/uL (ref 4.20–5.80)
RDW: 24.7 % — AB (ref 11.0–15.0)
WBC: 7 10*3/uL (ref 3.8–10.8)

## 2015-06-22 NOTE — Addendum Note (Signed)
Addended by: Eulis Foster on: 06/22/2015 11:30 AM   Modules accepted: Orders

## 2015-06-23 ENCOUNTER — Telehealth: Payer: Self-pay | Admitting: *Deleted

## 2015-06-23 DIAGNOSIS — F0390 Unspecified dementia without behavioral disturbance: Secondary | ICD-10-CM | POA: Diagnosis not present

## 2015-06-23 DIAGNOSIS — Z7982 Long term (current) use of aspirin: Secondary | ICD-10-CM | POA: Diagnosis not present

## 2015-06-23 DIAGNOSIS — N390 Urinary tract infection, site not specified: Secondary | ICD-10-CM | POA: Diagnosis not present

## 2015-06-23 DIAGNOSIS — I119 Hypertensive heart disease without heart failure: Secondary | ICD-10-CM | POA: Diagnosis not present

## 2015-06-23 DIAGNOSIS — H3530 Unspecified macular degeneration: Secondary | ICD-10-CM | POA: Diagnosis not present

## 2015-06-23 DIAGNOSIS — R296 Repeated falls: Secondary | ICD-10-CM | POA: Diagnosis not present

## 2015-06-23 NOTE — Telephone Encounter (Signed)
I lmom lab results on vm that indentified pt by name. I also called [facility] where pt resides to get fax # for the nurse with the pt today. Fax # is [phone].

## 2015-06-27 DIAGNOSIS — I119 Hypertensive heart disease without heart failure: Secondary | ICD-10-CM | POA: Diagnosis not present

## 2015-06-27 DIAGNOSIS — R296 Repeated falls: Secondary | ICD-10-CM | POA: Diagnosis not present

## 2015-06-27 DIAGNOSIS — F0390 Unspecified dementia without behavioral disturbance: Secondary | ICD-10-CM | POA: Diagnosis not present

## 2015-06-27 DIAGNOSIS — Z7982 Long term (current) use of aspirin: Secondary | ICD-10-CM | POA: Diagnosis not present

## 2015-06-27 DIAGNOSIS — N390 Urinary tract infection, site not specified: Secondary | ICD-10-CM | POA: Diagnosis not present

## 2015-06-27 DIAGNOSIS — H3530 Unspecified macular degeneration: Secondary | ICD-10-CM | POA: Diagnosis not present

## 2015-06-29 ENCOUNTER — Ambulatory Visit (HOSPITAL_COMMUNITY): Payer: Medicare Other | Attending: Physician Assistant

## 2015-06-29 ENCOUNTER — Other Ambulatory Visit: Payer: Self-pay

## 2015-06-29 DIAGNOSIS — N390 Urinary tract infection, site not specified: Secondary | ICD-10-CM | POA: Diagnosis not present

## 2015-06-29 DIAGNOSIS — I119 Hypertensive heart disease without heart failure: Secondary | ICD-10-CM | POA: Diagnosis not present

## 2015-06-29 DIAGNOSIS — R011 Cardiac murmur, unspecified: Secondary | ICD-10-CM | POA: Insufficient documentation

## 2015-06-29 DIAGNOSIS — Z7982 Long term (current) use of aspirin: Secondary | ICD-10-CM | POA: Diagnosis not present

## 2015-06-29 DIAGNOSIS — Z87891 Personal history of nicotine dependence: Secondary | ICD-10-CM | POA: Insufficient documentation

## 2015-06-29 DIAGNOSIS — D649 Anemia, unspecified: Secondary | ICD-10-CM | POA: Insufficient documentation

## 2015-06-29 DIAGNOSIS — E785 Hyperlipidemia, unspecified: Secondary | ICD-10-CM | POA: Diagnosis not present

## 2015-06-29 DIAGNOSIS — I358 Other nonrheumatic aortic valve disorders: Secondary | ICD-10-CM | POA: Diagnosis not present

## 2015-06-29 DIAGNOSIS — H3530 Unspecified macular degeneration: Secondary | ICD-10-CM | POA: Diagnosis not present

## 2015-06-29 DIAGNOSIS — F0390 Unspecified dementia without behavioral disturbance: Secondary | ICD-10-CM | POA: Diagnosis not present

## 2015-06-29 DIAGNOSIS — R296 Repeated falls: Secondary | ICD-10-CM | POA: Diagnosis not present

## 2015-06-30 ENCOUNTER — Telehealth: Payer: Self-pay | Admitting: *Deleted

## 2015-06-30 ENCOUNTER — Encounter: Payer: Self-pay | Admitting: Physician Assistant

## 2015-06-30 NOTE — Telephone Encounter (Signed)
Patient's niece is calling to ask about patient's recent antibiotic and probiotic use. Michael Bradley is asking for a fax stating patient does not have to continue both antibiotic and probiotic once finished,  to discontinue use on their charts.  [facility] fax# [phone]

## 2015-06-30 NOTE — Telephone Encounter (Signed)
DPR on file for neice [name] who has been notified about the fax needed to discontinue use on their charts for the antibiotic and probiotic that this has to come from Dr. Phillips Climes, MD. who ordered meds. [name] verbalized understanding.

## 2015-06-30 NOTE — Telephone Encounter (Signed)
DPR for neice [name] who has been notified of echo results for pt by phone with verbal understanding.

## 2015-06-30 NOTE — Telephone Encounter (Signed)
Can we find out who prescribed it? It should be that provider that makes those determinations. Let me know what you find out. Richardson Dopp, PA-C   06/30/2015 5:02 PM

## 2015-07-06 DIAGNOSIS — N390 Urinary tract infection, site not specified: Secondary | ICD-10-CM | POA: Diagnosis not present

## 2015-07-06 DIAGNOSIS — H3530 Unspecified macular degeneration: Secondary | ICD-10-CM | POA: Diagnosis not present

## 2015-07-06 DIAGNOSIS — Z7982 Long term (current) use of aspirin: Secondary | ICD-10-CM | POA: Diagnosis not present

## 2015-07-06 DIAGNOSIS — I119 Hypertensive heart disease without heart failure: Secondary | ICD-10-CM | POA: Diagnosis not present

## 2015-07-06 DIAGNOSIS — F0390 Unspecified dementia without behavioral disturbance: Secondary | ICD-10-CM | POA: Diagnosis not present

## 2015-07-06 DIAGNOSIS — R296 Repeated falls: Secondary | ICD-10-CM | POA: Diagnosis not present

## 2015-07-12 ENCOUNTER — Ambulatory Visit (INDEPENDENT_AMBULATORY_CARE_PROVIDER_SITE_OTHER): Payer: Medicare Other | Admitting: Ophthalmology

## 2015-07-12 DIAGNOSIS — H35033 Hypertensive retinopathy, bilateral: Secondary | ICD-10-CM | POA: Diagnosis not present

## 2015-07-12 DIAGNOSIS — H353221 Exudative age-related macular degeneration, left eye, with active choroidal neovascularization: Secondary | ICD-10-CM | POA: Diagnosis not present

## 2015-07-12 DIAGNOSIS — H353114 Nonexudative age-related macular degeneration, right eye, advanced atrophic with subfoveal involvement: Secondary | ICD-10-CM

## 2015-07-12 DIAGNOSIS — H43813 Vitreous degeneration, bilateral: Secondary | ICD-10-CM | POA: Diagnosis not present

## 2015-07-12 DIAGNOSIS — I10 Essential (primary) hypertension: Secondary | ICD-10-CM

## 2015-07-13 DIAGNOSIS — R296 Repeated falls: Secondary | ICD-10-CM | POA: Diagnosis not present

## 2015-07-13 DIAGNOSIS — Z7982 Long term (current) use of aspirin: Secondary | ICD-10-CM | POA: Diagnosis not present

## 2015-07-13 DIAGNOSIS — F0390 Unspecified dementia without behavioral disturbance: Secondary | ICD-10-CM | POA: Diagnosis not present

## 2015-07-13 DIAGNOSIS — H3530 Unspecified macular degeneration: Secondary | ICD-10-CM | POA: Diagnosis not present

## 2015-07-13 DIAGNOSIS — I119 Hypertensive heart disease without heart failure: Secondary | ICD-10-CM | POA: Diagnosis not present

## 2015-07-13 DIAGNOSIS — N390 Urinary tract infection, site not specified: Secondary | ICD-10-CM | POA: Diagnosis not present

## 2015-07-20 DIAGNOSIS — Z7982 Long term (current) use of aspirin: Secondary | ICD-10-CM | POA: Diagnosis not present

## 2015-07-20 DIAGNOSIS — N390 Urinary tract infection, site not specified: Secondary | ICD-10-CM | POA: Diagnosis not present

## 2015-07-20 DIAGNOSIS — H3530 Unspecified macular degeneration: Secondary | ICD-10-CM | POA: Diagnosis not present

## 2015-07-20 DIAGNOSIS — R296 Repeated falls: Secondary | ICD-10-CM | POA: Diagnosis not present

## 2015-07-20 DIAGNOSIS — F0390 Unspecified dementia without behavioral disturbance: Secondary | ICD-10-CM | POA: Diagnosis not present

## 2015-07-20 DIAGNOSIS — I119 Hypertensive heart disease without heart failure: Secondary | ICD-10-CM | POA: Diagnosis not present

## 2015-07-27 DIAGNOSIS — N390 Urinary tract infection, site not specified: Secondary | ICD-10-CM | POA: Diagnosis not present

## 2015-07-27 DIAGNOSIS — H3530 Unspecified macular degeneration: Secondary | ICD-10-CM | POA: Diagnosis not present

## 2015-07-27 DIAGNOSIS — F0390 Unspecified dementia without behavioral disturbance: Secondary | ICD-10-CM | POA: Diagnosis not present

## 2015-07-27 DIAGNOSIS — R296 Repeated falls: Secondary | ICD-10-CM | POA: Diagnosis not present

## 2015-07-27 DIAGNOSIS — Z7982 Long term (current) use of aspirin: Secondary | ICD-10-CM | POA: Diagnosis not present

## 2015-07-27 DIAGNOSIS — I119 Hypertensive heart disease without heart failure: Secondary | ICD-10-CM | POA: Diagnosis not present

## 2015-08-04 ENCOUNTER — Telehealth: Payer: Self-pay | Admitting: *Deleted

## 2015-08-04 NOTE — Telephone Encounter (Signed)
------------------------------------------------------------   Gara Kroner [facility]               CID [id number]  Patient Michael Bradley       Pt's Dr Rexene Alberts        Area Code [phone] Phone# [phone] * DOB [phone]     RE [facility]HE[facility]'S PT, UPDATE ON CONDITION                                                                    Disp:Y/N N If Y = C/B If No Response In 67minutes ============================================================

## 2015-08-04 NOTE — Telephone Encounter (Signed)
Rn call patients niece [name] back about her uncle. [name] stated she wanted to let Dr. Rexene Alberts know his memory is declining and deteriorating. PT will be moving to a memory unit in a week or two. Rn ask [name] if she wanted to schedule a earlier follow up with Dr. Rexene Alberts or the NP. [name] stated she will wait until her uncle gets settle in the memory unit. [name] will call back in a couple of weeks to discuss another appointment.

## 2015-10-12 DIAGNOSIS — E039 Hypothyroidism, unspecified: Secondary | ICD-10-CM | POA: Diagnosis not present

## 2015-10-12 DIAGNOSIS — I10 Essential (primary) hypertension: Secondary | ICD-10-CM | POA: Diagnosis not present

## 2015-10-12 DIAGNOSIS — D649 Anemia, unspecified: Secondary | ICD-10-CM | POA: Diagnosis not present

## 2015-10-12 DIAGNOSIS — H04123 Dry eye syndrome of bilateral lacrimal glands: Secondary | ICD-10-CM | POA: Diagnosis not present

## 2015-10-12 DIAGNOSIS — E785 Hyperlipidemia, unspecified: Secondary | ICD-10-CM | POA: Diagnosis not present

## 2015-10-12 DIAGNOSIS — I519 Heart disease, unspecified: Secondary | ICD-10-CM | POA: Diagnosis not present

## 2015-10-12 DIAGNOSIS — K219 Gastro-esophageal reflux disease without esophagitis: Secondary | ICD-10-CM | POA: Diagnosis not present

## 2015-10-18 ENCOUNTER — Other Ambulatory Visit: Payer: Self-pay

## 2015-10-18 ENCOUNTER — Telehealth: Payer: Self-pay | Admitting: Neurology

## 2015-10-18 NOTE — Telephone Encounter (Signed)
Patient's niece and PA is calling and states she would like to cancel the patient's Rx Namenda as he is in the memory unit at [facility] and does not feel it will benefit him now.  Please fax to [place] [phone].

## 2015-10-18 NOTE — Telephone Encounter (Signed)
Would have PCP fill Levothyroxine

## 2015-10-19 NOTE — Telephone Encounter (Signed)
Niece has cancelled patient's appt on 10/25/15 since he has moved to [facility]. What is your suggestion for his Namenda? Niece would like to d/c it. Or have resident physician evaluate patient and make determination?

## 2015-10-19 NOTE — Telephone Encounter (Signed)
I spoke to [place] and gave her recommendations below. She voiced understanding and will have physician at [place] make that decision.

## 2015-10-19 NOTE — Telephone Encounter (Signed)
Please advise niece, that we can talk about it during an appointment or to talk to his overseeing physician at memory care about any medication changes.

## 2015-10-25 ENCOUNTER — Ambulatory Visit: Payer: Medicare Other | Admitting: Neurology

## 2015-10-25 DIAGNOSIS — F0150 Vascular dementia without behavioral disturbance: Secondary | ICD-10-CM | POA: Diagnosis not present

## 2015-10-25 DIAGNOSIS — F3341 Major depressive disorder, recurrent, in partial remission: Secondary | ICD-10-CM | POA: Diagnosis not present

## 2015-10-26 DIAGNOSIS — E785 Hyperlipidemia, unspecified: Secondary | ICD-10-CM | POA: Diagnosis not present

## 2015-10-26 DIAGNOSIS — E039 Hypothyroidism, unspecified: Secondary | ICD-10-CM | POA: Diagnosis not present

## 2015-10-26 DIAGNOSIS — D649 Anemia, unspecified: Secondary | ICD-10-CM | POA: Diagnosis not present

## 2015-10-26 DIAGNOSIS — I10 Essential (primary) hypertension: Secondary | ICD-10-CM | POA: Diagnosis not present

## 2015-11-09 DIAGNOSIS — E785 Hyperlipidemia, unspecified: Secondary | ICD-10-CM | POA: Diagnosis not present

## 2015-11-09 DIAGNOSIS — H04123 Dry eye syndrome of bilateral lacrimal glands: Secondary | ICD-10-CM | POA: Diagnosis not present

## 2015-11-09 DIAGNOSIS — I10 Essential (primary) hypertension: Secondary | ICD-10-CM | POA: Diagnosis not present

## 2015-11-09 DIAGNOSIS — D649 Anemia, unspecified: Secondary | ICD-10-CM | POA: Diagnosis not present

## 2015-11-09 DIAGNOSIS — E039 Hypothyroidism, unspecified: Secondary | ICD-10-CM | POA: Diagnosis not present

## 2015-11-09 DIAGNOSIS — K219 Gastro-esophageal reflux disease without esophagitis: Secondary | ICD-10-CM | POA: Diagnosis not present

## 2015-11-09 DIAGNOSIS — I519 Heart disease, unspecified: Secondary | ICD-10-CM | POA: Diagnosis not present

## 2015-11-15 DIAGNOSIS — I519 Heart disease, unspecified: Secondary | ICD-10-CM | POA: Diagnosis not present

## 2015-11-15 DIAGNOSIS — E785 Hyperlipidemia, unspecified: Secondary | ICD-10-CM | POA: Diagnosis not present

## 2015-11-15 DIAGNOSIS — H04123 Dry eye syndrome of bilateral lacrimal glands: Secondary | ICD-10-CM | POA: Diagnosis not present

## 2015-11-15 DIAGNOSIS — K219 Gastro-esophageal reflux disease without esophagitis: Secondary | ICD-10-CM | POA: Diagnosis not present

## 2015-11-15 DIAGNOSIS — D649 Anemia, unspecified: Secondary | ICD-10-CM | POA: Diagnosis not present

## 2015-11-23 DIAGNOSIS — R319 Hematuria, unspecified: Secondary | ICD-10-CM | POA: Diagnosis not present

## 2015-11-29 DIAGNOSIS — F3341 Major depressive disorder, recurrent, in partial remission: Secondary | ICD-10-CM | POA: Diagnosis not present

## 2015-11-29 DIAGNOSIS — F0150 Vascular dementia without behavioral disturbance: Secondary | ICD-10-CM | POA: Diagnosis not present

## 2015-12-14 DIAGNOSIS — D649 Anemia, unspecified: Secondary | ICD-10-CM | POA: Diagnosis not present

## 2015-12-14 DIAGNOSIS — E785 Hyperlipidemia, unspecified: Secondary | ICD-10-CM | POA: Diagnosis not present

## 2015-12-14 DIAGNOSIS — H04123 Dry eye syndrome of bilateral lacrimal glands: Secondary | ICD-10-CM | POA: Diagnosis not present

## 2015-12-14 DIAGNOSIS — K219 Gastro-esophageal reflux disease without esophagitis: Secondary | ICD-10-CM | POA: Diagnosis not present

## 2015-12-14 DIAGNOSIS — I519 Heart disease, unspecified: Secondary | ICD-10-CM | POA: Diagnosis not present

## 2015-12-14 DIAGNOSIS — I10 Essential (primary) hypertension: Secondary | ICD-10-CM | POA: Diagnosis not present

## 2015-12-14 DIAGNOSIS — E039 Hypothyroidism, unspecified: Secondary | ICD-10-CM | POA: Diagnosis not present

## 2015-12-14 DIAGNOSIS — F0150 Vascular dementia without behavioral disturbance: Secondary | ICD-10-CM | POA: Diagnosis not present

## 2015-12-14 DIAGNOSIS — F3341 Major depressive disorder, recurrent, in partial remission: Secondary | ICD-10-CM | POA: Diagnosis not present

## 2015-12-18 DIAGNOSIS — Z23 Encounter for immunization: Secondary | ICD-10-CM | POA: Diagnosis not present

## 2015-12-28 ENCOUNTER — Inpatient Hospital Stay (HOSPITAL_COMMUNITY)
Admission: EM | Admit: 2015-12-28 | Discharge: 2015-12-31 | DRG: 071 | Disposition: A | Discharge status: Home Health | Payer: Medicare Other | Attending: Internal Medicine | Admitting: Internal Medicine

## 2015-12-28 ENCOUNTER — Emergency Department (HOSPITAL_COMMUNITY): Payer: Medicare Other

## 2015-12-28 ENCOUNTER — Encounter (HOSPITAL_COMMUNITY): Payer: Self-pay

## 2015-12-28 DIAGNOSIS — N183 Chronic kidney disease, stage 3 unspecified: Secondary | ICD-10-CM | POA: Diagnosis present

## 2015-12-28 DIAGNOSIS — G309 Alzheimer's disease, unspecified: Secondary | ICD-10-CM | POA: Diagnosis not present

## 2015-12-28 DIAGNOSIS — Z79899 Other long term (current) drug therapy: Secondary | ICD-10-CM | POA: Diagnosis not present

## 2015-12-28 DIAGNOSIS — E876 Hypokalemia: Secondary | ICD-10-CM | POA: Diagnosis not present

## 2015-12-28 DIAGNOSIS — I130 Hypertensive heart and chronic kidney disease with heart failure and stage 1 through stage 4 chronic kidney disease, or unspecified chronic kidney disease: Secondary | ICD-10-CM | POA: Diagnosis present

## 2015-12-28 DIAGNOSIS — Z66 Do not resuscitate: Secondary | ICD-10-CM | POA: Diagnosis present

## 2015-12-28 DIAGNOSIS — Z7982 Long term (current) use of aspirin: Secondary | ICD-10-CM

## 2015-12-28 DIAGNOSIS — Z7189 Other specified counseling: Secondary | ICD-10-CM | POA: Diagnosis not present

## 2015-12-28 DIAGNOSIS — R001 Bradycardia, unspecified: Secondary | ICD-10-CM | POA: Diagnosis present

## 2015-12-28 DIAGNOSIS — K219 Gastro-esophageal reflux disease without esophagitis: Secondary | ICD-10-CM | POA: Diagnosis present

## 2015-12-28 DIAGNOSIS — F329 Major depressive disorder, single episode, unspecified: Secondary | ICD-10-CM | POA: Diagnosis present

## 2015-12-28 DIAGNOSIS — R531 Weakness: Secondary | ICD-10-CM | POA: Diagnosis not present

## 2015-12-28 DIAGNOSIS — E785 Hyperlipidemia, unspecified: Secondary | ICD-10-CM | POA: Diagnosis present

## 2015-12-28 DIAGNOSIS — I10 Essential (primary) hypertension: Secondary | ICD-10-CM | POA: Diagnosis present

## 2015-12-28 DIAGNOSIS — F32A Depression, unspecified: Secondary | ICD-10-CM | POA: Diagnosis present

## 2015-12-28 DIAGNOSIS — I5032 Chronic diastolic (congestive) heart failure: Secondary | ICD-10-CM | POA: Diagnosis not present

## 2015-12-28 DIAGNOSIS — R4182 Altered mental status, unspecified: Secondary | ICD-10-CM | POA: Diagnosis not present

## 2015-12-28 DIAGNOSIS — G9340 Encephalopathy, unspecified: Secondary | ICD-10-CM | POA: Diagnosis not present

## 2015-12-28 DIAGNOSIS — E039 Hypothyroidism, unspecified: Secondary | ICD-10-CM | POA: Diagnosis present

## 2015-12-28 DIAGNOSIS — Z87891 Personal history of nicotine dependence: Secondary | ICD-10-CM | POA: Diagnosis not present

## 2015-12-28 DIAGNOSIS — F0390 Unspecified dementia without behavioral disturbance: Secondary | ICD-10-CM | POA: Diagnosis present

## 2015-12-28 DIAGNOSIS — Z515 Encounter for palliative care: Secondary | ICD-10-CM | POA: Diagnosis not present

## 2015-12-28 DIAGNOSIS — R404 Transient alteration of awareness: Secondary | ICD-10-CM | POA: Diagnosis not present

## 2015-12-28 LAB — CBC WITH DIFFERENTIAL/PLATELET
BASOS PCT: 1 %
Basophils Absolute: 0.1 10*3/uL (ref 0.0–0.1)
EOS ABS: 1.1 10*3/uL — AB (ref 0.0–0.7)
Eosinophils Relative: 13 %
HEMATOCRIT: 38.6 % — AB (ref 39.0–52.0)
Hemoglobin: 13.1 g/dL (ref 13.0–17.0)
Lymphocytes Relative: 20 %
Lymphs Abs: 1.7 10*3/uL (ref 0.7–4.0)
MCH: 31.4 pg (ref 26.0–34.0)
MCHC: 33.9 g/dL (ref 30.0–36.0)
MCV: 92.6 fL (ref 78.0–100.0)
MONO ABS: 0.9 10*3/uL (ref 0.1–1.0)
MONOS PCT: 10 %
NEUTROS ABS: 4.8 10*3/uL (ref 1.7–7.7)
Neutrophils Relative %: 56 %
Platelets: 217 10*3/uL (ref 150–400)
RBC: 4.17 MIL/uL — ABNORMAL LOW (ref 4.22–5.81)
RDW: 12.6 % (ref 11.5–15.5)
WBC: 8.5 10*3/uL (ref 4.0–10.5)

## 2015-12-28 LAB — COMPREHENSIVE METABOLIC PANEL
ALBUMIN: 3.8 g/dL (ref 3.5–5.0)
ALT: 18 U/L (ref 17–63)
ANION GAP: 8 (ref 5–15)
AST: 26 U/L (ref 15–41)
Alkaline Phosphatase: 98 U/L (ref 38–126)
BILIRUBIN TOTAL: 0.5 mg/dL (ref 0.3–1.2)
BUN: 14 mg/dL (ref 6–20)
CO2: 25 mmol/L (ref 22–32)
Calcium: 9.3 mg/dL (ref 8.9–10.3)
Chloride: 107 mmol/L (ref 101–111)
Creatinine, Ser: 1.27 mg/dL — ABNORMAL HIGH (ref 0.61–1.24)
GFR calc non Af Amer: 49 mL/min — ABNORMAL LOW (ref >60)
GFR, EST AFRICAN AMERICAN: 57 mL/min — AB (ref >60)
GLUCOSE: 100 mg/dL — AB (ref 65–99)
POTASSIUM: 3.5 mmol/L (ref 3.5–5.1)
Sodium: 140 mmol/L (ref 135–145)
TOTAL PROTEIN: 7.2 g/dL (ref 6.5–8.1)

## 2015-12-28 LAB — I-STAT TROPONIN, ED: Troponin i, poc: 0 ng/mL (ref 0.00–0.08)

## 2015-12-28 LAB — CBG MONITORING, ED: GLUCOSE-CAPILLARY: 99 mg/dL (ref 65–99)

## 2015-12-28 LAB — AMMONIA: AMMONIA: 18 umol/L (ref 9–35)

## 2015-12-28 LAB — LIPASE, BLOOD: LIPASE: 36 U/L (ref 11–51)

## 2015-12-28 LAB — TSH: TSH: 3.955 u[IU]/mL (ref 0.350–4.500)

## 2015-12-28 LAB — BRAIN NATRIURETIC PEPTIDE: B NATRIURETIC PEPTIDE 5: 154.1 pg/mL — AB (ref 0.0–100.0)

## 2015-12-28 MED ORDER — SODIUM CHLORIDE 0.9% FLUSH: 3.0000 mL | INTRAVENOUS | Status: DC | PRN

## 2015-12-28 MED ORDER — LEVOTHYROXINE SODIUM 50 MCG PO TABS
50.0000 ug | ORAL_TABLET | Freq: Every day | ORAL | Status: DC
  Administered 2015-12-29 – 2015-12-31 (×3): 50 ug via ORAL
  Filled 2015-12-28 (×3): qty 1

## 2015-12-28 MED ORDER — TERAZOSIN HCL 2 MG PO CAPS
4.0000 mg | ORAL_CAPSULE | Freq: Every day | ORAL | Status: DC
  Administered 2015-12-29 – 2015-12-30 (×2): 4 mg via ORAL
  Filled 2015-12-28 (×3): qty 2

## 2015-12-28 MED ORDER — ONDANSETRON HCL 4 MG PO TABS: 4.0000 mg | ORAL_TABLET | Freq: Four times a day (QID) | ORAL | Status: DC | PRN

## 2015-12-28 MED ORDER — HYDROCODONE-ACETAMINOPHEN 5-325 MG PO TABS: 1.0000 | ORAL_TABLET | ORAL | Status: DC | PRN

## 2015-12-28 MED ORDER — BISACODYL 5 MG PO TBEC: 5.0000 mg | DELAYED_RELEASE_TABLET | Freq: Every day | ORAL | Status: DC | PRN

## 2015-12-28 MED ORDER — ACETAMINOPHEN 325 MG PO TABS: 325.0000 mg | ORAL_TABLET | Freq: Four times a day (QID) | ORAL | Status: DC | PRN

## 2015-12-28 MED ORDER — ACETAMINOPHEN 325 MG PO TABS: 650.0000 mg | ORAL_TABLET | Freq: Four times a day (QID) | ORAL | Status: DC | PRN

## 2015-12-28 MED ORDER — FERROUS SULFATE 325 (65 FE) MG PO TABS
325.0000 mg | ORAL_TABLET | Freq: Every day | ORAL | Status: DC
  Administered 2015-12-29 – 2015-12-31 (×3): 325 mg via ORAL
  Filled 2015-12-28 (×3): qty 1

## 2015-12-28 MED ORDER — SODIUM CHLORIDE 0.9 % IV SOLN: 250.0000 mL | INTRAVENOUS | Status: DC | PRN

## 2015-12-28 MED ORDER — HEPARIN SODIUM (PORCINE) 5000 UNIT/ML IJ SOLN
5000.0000 [IU] | Freq: Three times a day (TID) | INTRAMUSCULAR | Status: DC
  Administered 2015-12-28 – 2015-12-31 (×8): 5000 [IU] via SUBCUTANEOUS
  Filled 2015-12-28 (×8): qty 1

## 2015-12-28 MED ORDER — ACETAMINOPHEN 650 MG RE SUPP: 650.0000 mg | Freq: Four times a day (QID) | RECTAL | Status: DC | PRN

## 2015-12-28 MED ORDER — PANTOPRAZOLE SODIUM 40 MG PO TBEC
40.0000 mg | DELAYED_RELEASE_TABLET | Freq: Every day | ORAL | Status: DC
  Administered 2015-12-29 – 2015-12-31 (×3): 40 mg via ORAL
  Filled 2015-12-28 (×3): qty 1

## 2015-12-28 MED ORDER — ATORVASTATIN CALCIUM 40 MG PO TABS
40.0000 mg | ORAL_TABLET | Freq: Every day | ORAL | Status: DC
  Administered 2015-12-29 – 2015-12-31 (×3): 40 mg via ORAL
  Filled 2015-12-28 (×3): qty 1

## 2015-12-28 MED ORDER — ASPIRIN EC 81 MG PO TBEC
81.0000 mg | DELAYED_RELEASE_TABLET | Freq: Every day | ORAL | Status: DC
  Administered 2015-12-29 – 2015-12-31 (×3): 81 mg via ORAL
  Filled 2015-12-28 (×3): qty 1

## 2015-12-28 MED ORDER — POLYETHYLENE GLYCOL 3350 17 G PO PACK: 17.0000 g | PACK | Freq: Every day | ORAL | Status: DC | PRN

## 2015-12-28 MED ORDER — ESCITALOPRAM OXALATE 10 MG PO TABS
5.0000 mg | ORAL_TABLET | Freq: Every day | ORAL | Status: DC
Start: 2015-12-29 — End: 2015-12-31
  Administered 2015-12-29 – 2015-12-31 (×3): 5 mg via ORAL
  Filled 2015-12-28 (×3): qty 1

## 2015-12-28 MED ORDER — GALANTAMINE HYDROBROMIDE ER 24 MG PO CP24
24.0000 mg | ORAL_CAPSULE | Freq: Every day | ORAL | Status: DC
  Filled 2015-12-28 (×2): qty 1

## 2015-12-28 MED ORDER — ONDANSETRON HCL 4 MG/2ML IJ SOLN: 4.0000 mg | Freq: Four times a day (QID) | INTRAMUSCULAR | Status: DC | PRN

## 2015-12-28 MED ORDER — MEMANTINE HCL ER 7 MG PO CP24
21.0000 mg | ORAL_CAPSULE | Freq: Every day | ORAL | Status: DC
  Administered 2015-12-29: 21 mg via ORAL
  Filled 2015-12-28: qty 1

## 2015-12-28 MED ORDER — SODIUM CHLORIDE 0.9% FLUSH
3.0000 mL | Freq: Two times a day (BID) | INTRAVENOUS | Status: DC
  Administered 2015-12-28 – 2015-12-31 (×4): 3 mL via INTRAVENOUS

## 2015-12-28 MED ORDER — FUROSEMIDE 10 MG/ML IJ SOLN
20.0000 mg | Freq: Once | INTRAMUSCULAR | Status: AC
  Administered 2015-12-28: 20 mg via INTRAVENOUS
  Filled 2015-12-28: qty 2

## 2015-12-28 NOTE — ED Notes (Signed)
Dr Miller at bedside. 

## 2015-12-28 NOTE — ED Notes (Signed)
Pt has removed all of his monitoring equipment.

## 2015-12-28 NOTE — ED Notes (Signed)
Patient transported to X-ray 

## 2015-12-28 NOTE — ED Provider Notes (Signed)
The pt has had abnormal mental status today - usually walks without difficulty - speaks without difficulty at baseline, but today is mumbling - not making sense and having trouble following commands.  No other acute findings - heart and lungs clear, abd soft and non tender, no edema.  Needs eval for abnormal mental status / neuro exam / level of alertness.  Anticipate admission.  I saw and evaluated the patient, reviewed the resident's note and I agree with the findings and plan.   EKG Interpretation  Date/Time:  Wednesday December 28 2015 14:58:35 EDT Ventricular Rate:  54 PR Interval:    QRS Duration: 101 QT Interval:  467 QTC Calculation: 443 R Axis:   -37 Text Interpretation:  Sinus rhythm Atrial premature complex Incomplete RBBB and LAFB Abnormal R-wave progression, late transition Left ventricular hypertrophy Since last tracing rate slower Confirmed by Dorsie Burich  MD, Nichols Corter ([id number]) on 12/28/2015 7:05:43 PM      I saw and evaluated the patient, reviewed the resident's note and I agree with the findings and plan.   EKG Interpretation  Date/Time:  Wednesday December 28 2015 14:58:35 EDT Ventricular Rate:  54 PR Interval:    QRS Duration: 101 QT Interval:  467 QTC Calculation: 443 R Axis:   -37 Text Interpretation:  Sinus rhythm Atrial premature complex Incomplete RBBB and LAFB Abnormal R-wave progression, late transition Left ventricular hypertrophy Since last tracing rate slower Confirmed by Gloriajean Okun  MD, Jenafer Winterton ([id number]) on 12/28/2015 7:05:43 PM       Final diagnoses:  Altered mental status, unspecified altered mental status type  Altered mental status      Noemi Chapel, MD 12/31/15 [phone]

## 2015-12-28 NOTE — ED Notes (Signed)
Dr Dean at bedside.

## 2015-12-28 NOTE — ED Provider Notes (Signed)
[facility] DEPT Provider Note   CSN: [id number] Arrival date & time: 12/28/15  1441     History   Chief Complaint Chief Complaint  Patient presents with  . Gait Problem    HPI Michael Bradley is a 82 y.o. male.   Altered Mental Status   This is a new problem. The current episode started 12 to 24 hours ago. The problem has not changed since onset.Associated symptoms include confusion. Pertinent negatives include no somnolence and no unresponsiveness. His past medical history is significant for dementia.    Past Medical History:  Diagnosis Date  . Abnormal chest x-ray 06/2014   chronic stable bilateral interstitial opacities without focal consolidation  . Chest pain, atypical   . Dementia 2011  . ED (erectile dysfunction)   . History of echocardiogram    Echo 4/17: Mild LVH, focal basal hypertrophy, EF 55-60%, normal wall motion, calcified AV annulus, no aortic stenosis, severe LAE  . History of kidney stones   . Hyperlipidemia 2013  . Hypertension   . Hypothyroidism   . 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  . Microcytic anemia 12/2014  . Palpitations     Patient Active Problem List   Diagnosis Date Noted  . Bleeding gastrointestinal   . GI bleed 06/02/2015  . Delirium 06/02/2015  . UTI (lower urinary tract infection) 06/02/2015  . Chronic blood loss anemia 06/02/2015  . Hypothyroid 08/16/2011  . Vertigo 08/16/2011  . Dementia 12/07/2010  . Hypercholesterolemia 12/07/2010  . Benign hypertensive heart disease without heart failure 12/07/2010  . 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 Dyspnea on exertion 12/07/2010    Past Surgical History:  Procedure Laterality Date  . CARDIOVASCULAR STRESS TEST  10/08/2002   EF 63%  . CATARACT EXTRACTION     os 2006-od 2010  . COLONOSCOPY  2009   Dr Delfin Edis: diverticulosis, hemorrhoids, small cecal polyp (tubular adenoma)  . COLONOSCOPY N/A 06/04/2015   Procedure:  COLONOSCOPY;  Surgeon: Milus Banister, MD;  Location: [facility];  Service: Endoscopy;  Laterality: N/A;  . ESOPHAGOGASTRODUODENOSCOPY N/A 06/04/2015   Procedure: ESOPHAGOGASTRODUODENOSCOPY (EGD);  Surgeon: Milus Banister, MD;  Location: [facility];  Service: Endoscopy;  Laterality: N/A;  . LOBECTOMY  1971  . THORACOTOMY Right 1971   REMOVAL OF CARCINOID TUMOR       Home Medications    Prior to Admission medications   Medication Sig Start Date End Date Taking? Authorizing Provider  acetaminophen (TYLENOL) 325 MG tablet Take 325-650 mg by mouth every 6 (six) hours as needed for mild pain, moderate pain or headache.     Historical Provider, MD  acidophilus (RISAQUAD) CAPS capsule Take 2 capsules by mouth daily. 06/05/15   Silver Huguenin Elgergawy, MD  amoxicillin (AMOXIL) 500 MG capsule Take 1 capsule (500 mg total) by mouth every 8 (eight) hours. Please take for 10 days. 06/05/15   Albertine Patricia, MD  aspirin 81 MG tablet Take 81 mg by mouth daily.      Historical Provider, MD  atorvastatin (LIPITOR) 80 MG tablet Take 0.5 tablets (40 mg total) by mouth daily. 05/15/13   Darlin Coco, MD  chlorpheniramine (CHLOR-TRIMETON) 4 MG tablet Take 4 mg by mouth 2 (two) times daily as needed for allergies.    Historical Provider, MD  cyanocobalamin (,VITAMIN B-12,) 1000 MCG/ML injection Inject 1 mL (1,000 mcg total) into the muscle every 30 (thirty) days. 06/05/15  Silver Huguenin Elgergawy, MD  escitalopram (LEXAPRO) 5 MG tablet Take 5 mg by mouth daily.    Historical Provider, MD  ferrous sulfate 325 (65 FE) MG tablet Take 1 tablet (325 mg total) by mouth daily with breakfast. 06/05/15   Albertine Patricia, MD  furosemide (LASIX) 20 MG tablet Take 1 tablet (20 mg total) by mouth daily. 07/07/14   Darlin Coco, MD  galantamine (RAZADYNE ER) 24 MG 24 hr capsule Take 1 capsule (24 mg total) by mouth daily with breakfast. 12/13/14   Star Age, MD  levothyroxine (SYNTHROID, LEVOTHROID) 25 MCG tablet TAKE  1 TABLET ONCE DAILY EXCEPT 2 TABLETS ON WEDNESDAY AND ON SUNDAY. 02/04/15   Darlin Coco, MD  levothyroxine (SYNTHROID, LEVOTHROID) 25 MCG tablet TAKE 1 TABLET (25 MCG TOTAL) BY MOUTH EVERY Monday, Tuesday, Thursday, Friday, Saturday once daily    Historical Provider, MD  lisinopril (PRINIVIL,ZESTRIL) 20 MG tablet Take 10 mg by mouth daily.      Historical Provider, MD  Memantine HCl ER (NAMENDA XR) 21 MG CP24 Take 21 mg by mouth daily.    Historical Provider, MD  omeprazole (PRILOSEC) 20 MG capsule Take 20 mg by mouth at bedtime.     Historical Provider, MD  Polyethyl Glycol-Propyl Glycol (SYSTANE OP) Place 1 drop into both ears daily.    Historical Provider, MD  terazosin (HYTRIN) 2 MG capsule Take 4 mg by mouth at bedtime. 02/04/13   Darlin Coco, MD    Family History Family History  Problem Relation Age of Onset  . Lung cancer Father   . Emphysema Father   . Lung cancer Brother   . Leukemia Mother   . Heart attack Brother   . Kidney cancer Brother     Social History Social History  Substance Use Topics  . Smoking status: Former Smoker    Quit date: 11/28/1980  . Smokeless tobacco: Never Used  . Alcohol use No     Allergies   Lisinopril and Quinolones   Review of Systems Review of Systems  Unable to perform ROS: Mental status change  Psychiatric/Behavioral: Positive for confusion.     Physical Exam Updated Vital Signs BP 139/72   Pulse (!) 55   Temp 98.1 F (36.7 C) (Oral)   Resp 19   SpO2 93%   Physical Exam  Constitutional: He appears well-developed and well-nourished.  HENT:  Head: Normocephalic.  Contusion to forehead  Eyes: Conjunctivae and EOM are normal. Pupils are equal, round, and reactive to light.  Neck: Normal range of motion. Neck supple.  Cardiovascular: Normal rate and regular rhythm.   Pulmonary/Chest: Effort normal and breath sounds normal. No respiratory distress.  Abdominal: Soft. There is no tenderness.  Musculoskeletal: He  exhibits no edema.  Neurological: He is alert.  Oriented to self only  Skin: Skin is warm and dry.  Psychiatric: He has a normal mood and affect.  Nursing note and vitals reviewed.    ED Treatments / Results  Labs (all labs ordered are listed, but only abnormal results are displayed) Labs Reviewed  CBC WITH DIFFERENTIAL/PLATELET - Abnormal; Notable for the following:       Result Value   RBC 4.17 (*)    HCT 38.6 (*)    Eosinophils Absolute 1.1 (*)    All other components within normal limits  COMPREHENSIVE METABOLIC PANEL - Abnormal; Notable for the following:    Glucose, Bld 100 (*)    Creatinine, Ser 1.27 (*)    GFR calc non Af  Amer 49 (*)    GFR calc Af Amer 57 (*)    All other components within normal limits  BRAIN NATRIURETIC PEPTIDE - Abnormal; Notable for the following:    B Natriuretic Peptide 154.1 (*)    All other components within normal limits  BASIC METABOLIC PANEL - Abnormal; Notable for the following:    Potassium 3.4 (*)    Creatinine, Ser 1.25 (*)    GFR calc non Af Amer 50 (*)    GFR calc Af Amer 58 (*)    All other components within normal limits  MRSA PCR SCREENING  URINE CULTURE  LIPASE, BLOOD  AMMONIA  TSH  URINALYSIS, ROUTINE W REFLEX MICROSCOPIC (NOT AT [facility])  GLUCOSE, CAPILLARY  CBG MONITORING, ED  I-STAT TROPOININ, ED    EKG  EKG Interpretation  Date/Time:  Wednesday December 28 2015 14:58:35 EDT Ventricular Rate:  54 PR Interval:    QRS Duration: 101 QT Interval:  467 QTC Calculation: 443 R Axis:   -37 Text Interpretation:  Sinus rhythm Atrial premature complex Incomplete RBBB and LAFB Abnormal R-wave progression, late transition Left ventricular hypertrophy Since last tracing rate slower Confirmed by MILLER  MD, BRIAN ([id number]) on 12/28/2015 7:05:43 PM       Radiology Dg Chest 2 View  Result Date: 12/28/2015 CLINICAL DATA:  Altered mental status EXAM: CHEST  2 VIEW COMPARISON:  06/02/2015 FINDINGS: Cardiac shadow is enlarged but  stable. Elevation right hemidiaphragm is again seen. Chronic changes are identified in both lungs somewhat accentuated by poor inspiratory technique. There may be some mildly superimpose to pulmonary edema present. No sizable effusion is noted. IMPRESSION: Diffuse chronic changes bilaterally. A slight increased density is noted likely related to mild pulmonary edema. Electronically Signed   By: Inez Catalina M.D.   On: 12/28/2015 17:45   Ct Head Wo Contrast  Result Date: 12/28/2015 CLINICAL DATA:  Dementia.  Altered mental status.  Gait disturbance. EXAM: CT HEAD WITHOUT CONTRAST TECHNIQUE: Contiguous axial images were obtained from the base of the skull through the vertex without intravenous contrast. COMPARISON:  MRI 12/16/2010 FINDINGS: Brain: There is mild age related atrophy. There are mild changes of chronic small vessel disease affecting the deep white matter. No sign of cortical or large vessel territory infarction. No mass lesion, hemorrhage, hydrocephalus or extra-axial collection. Ventricular prominence is in proportion to the degree of atrophy. Vascular: There is atherosclerotic calcification of the major vessels at the base of the brain. Skull: No calvarial abnormality. Sinuses/Orbits: Mild mucosal thickening along the floors of the maxillary sinuses. Otherwise clear. Orbits negative. Other: Negative IMPRESSION: No acute finding by CT. Ordinary age related atrophy. Mild chronic small-vessel disease of the white matter. Electronically Signed   By: Nelson Chimes M.D.   On: 12/28/2015 16:37    Procedures Procedures (including critical care time)  Medications Ordered in ED Medications  memantine (NAMENDA XR) 24 hr capsule 21 mg (21 mg Oral Given 12/29/15 0909)  ferrous sulfate tablet 325 mg (325 mg Oral Given 12/29/15 0909)  levothyroxine (SYNTHROID, LEVOTHROID) tablet 50 mcg (50 mcg Oral Given 12/29/15 0909)  escitalopram (LEXAPRO) tablet 5 mg (5 mg Oral Given 12/29/15 0909)  atorvastatin  (LIPITOR) tablet 40 mg (40 mg Oral Given 12/29/15 0909)  terazosin (HYTRIN) capsule 4 mg (not administered)  aspirin EC tablet 81 mg (81 mg Oral Given 12/29/15 0908)  pantoprazole (PROTONIX) EC tablet 40 mg (40 mg Oral Given 12/29/15 0909)  galantamine (RAZADYNE ER) 24 hr capsule 24 mg (not administered)  heparin injection 5,000 Units (5,000 Units Subcutaneous Given 12/29/15 0634)  sodium chloride flush (NS) 0.9 % injection 3 mL (3 mLs Intravenous Given 12/28/15 2216)  0.9 %  sodium chloride infusion (not administered)  sodium chloride flush (NS) 0.9 % injection 3 mL (not administered)  acetaminophen (TYLENOL) tablet 650 mg (not administered)    Or  acetaminophen (TYLENOL) suppository 650 mg (not administered)  HYDROcodone-acetaminophen (NORCO/VICODIN) 5-325 MG per tablet 1-2 tablet (not administered)  ondansetron (ZOFRAN) tablet 4 mg (not administered)    Or  ondansetron (ZOFRAN) injection 4 mg (not administered)  polyethylene glycol (MIRALAX / GLYCOLAX) packet 17 g (not administered)  bisacodyl (DULCOLAX) EC tablet 5 mg (not administered)  MEDLINE mouth rinse (15 mLs Mouth Rinse Not Given 12/29/15 0103)  furosemide (LASIX) tablet 20 mg (20 mg Oral Given 12/29/15 0909)  furosemide (LASIX) injection 20 mg (20 mg Intravenous Given 12/28/15 2215)     Initial Impression / Assessment and Plan / ED Course  I have reviewed the triage vital signs and the nursing notes.  Pertinent labs & imaging results that were available during my care of the patient were reviewed by me and considered in my medical decision making (see chart for details).  Clinical Course    Mr. Shamblen is an 82 year old male with PMH significant for dementia, HLD, HTN, hypothyroidism who presents for acute altered mental status.  I spoke with the nursing facility and they describe him as normally able to ambulate without difficulty and speaks clearly, known as a "jokester".  Today he is confused, mumbling, and very  unstable when standing. Walking with a shuffling gait with assistance.  Patient is able to follow some commands and is able to elevate and hold all extremities without drift.  Less likely stroke, but full neurologic exam is not possible to perform.  Outside window for TPA.  Altered mental status workup performed.  EKG obtained, demonstrates PACs and IVCD similar to priors but with slower rate.  Labs obtained, including Ua, culutres, BNP, troponin, Bg, CBC, CMP, lipase, ammonia, TSH.  Results unremarkable.  CXR obtained, personally reviewed by me, demonstrates mild pulmonary edema.  Patient also has new oxygen requirement, though he continues to remove his monitors and oxygen.  CT head demonstrates no acute findings.  Patient is admitted for altered mental status and new oxygen requirements.  Final Clinical Impressions(s) / ED Diagnoses   Final diagnoses:  Altered mental status, unspecified altered mental status type    New Prescriptions New Prescriptions   No medications on file     Elveria Rising, MD 12/29/15 [place], MD 12/31/15 [phone]

## 2015-12-28 NOTE — ED Notes (Signed)
Pt stated that he had to pee, this RN asked if the pt needed help and he said yes, this RN attempted to help the pt with removing his pants to help him with a urinal. The pt told this RN to stop and "no". This RN asked the pt if he had to pee and he said yes, this RN asked the pt if he would like help since he was struggling with his pants and he said No. This RN gave the pt the urinal and stepped out of the room.

## 2015-12-28 NOTE — H&P (Signed)
History and Physical    Michael Bradley [id number] DOB: 11/19/1925 DOA: 12/28/2015  PCP: No primary care provider on file.   Patient coming from: SNF  Chief Complaint: Increased confusion, agitation, unsteady gait  HPI: Michael Bradley is a 82 y.o. male with medical history significant for dementia, hypertension, hypothyroidism, depression, and chronic diastolic CHF who presents to the emergency department from his SNF for evaluation of increased confusion, agitation, and unsteady gait. Patient is reportedly disoriented at baseline, but typically pleasant and cooperative. He is normally able to walk without assistance at baseline and can hold the basic conversation. He is noted by SNF staff today to be increasingly confused with unintelligible speech, agitation, and unsteady gait. There had been no known fall or trauma and the patient had not voiced any specific complaints. History is obtained through discussion with SNF personnel, ED personnel, the patient's family member at the bedside, and review of the EMR. Patient's nephew at the bedside describes a very similar presentation when the patient was ultimately diagnosed with a UTI.  ED Course: Upon arrival to the ED, patient is found to be afebrile, saturating adequately on room air, bradycardic in the mid 50s, and with vitals otherwise stable. EKG demonstrates a sinus rhythm with incomplete right bundle branch block and left anterior fascicular block, and not significantly changed from priors. Troponin is undetectable and noncontrast head CT is negative for acute intracranial abnormality. Chest x-ray demonstrates a slight increase in interstitial density suggestive of a mild edema. Chemistry panel features a serum creatinine 1.27 which appears to be consistent with his baseline. CBC is unremarkable. TSH and ammonia levels return within the normal limits. Patient has not been cooperative in the ED and urinalysis has not yet been obtained for  this reason. Patient has remained hemodynamically stable and in no apparent respiratory distress. He will be admitted to the medical-surgical unit for ongoing evaluation and management of acute encephalopathy, suspected secondary to likely UTI with UA still pending.  Review of Systems:  Unable to obtain ROS secondary to patient's clinical condition with acute encephalopathy.  Past Medical History:  Diagnosis Date  . Abnormal chest x-ray 06/2014   chronic stable bilateral interstitial opacities without focal consolidation  . Chest pain, atypical   . Dementia 2011  . ED (erectile dysfunction)   . History of echocardiogram    Echo 4/17: Mild LVH, focal basal hypertrophy, EF 55-60%, normal wall motion, calcified AV annulus, no aortic stenosis, severe LAE  . History of kidney stones   . Hyperlipidemia 2013  . Hypertension   . Hypothyroidism   . 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  . Microcytic anemia 12/2014  . Palpitations     Past Surgical History:  Procedure Laterality Date  . CARDIOVASCULAR STRESS TEST  10/08/2002   EF 63%  . CATARACT EXTRACTION     os 2006-od 2010  . COLONOSCOPY  2009   Dr Delfin Edis: diverticulosis, hemorrhoids, small cecal polyp (tubular adenoma)  . COLONOSCOPY N/A 06/04/2015   Procedure: COLONOSCOPY;  Surgeon: Milus Banister, MD;  Location: [facility];  Service: Endoscopy;  Laterality: N/A;  . ESOPHAGOGASTRODUODENOSCOPY N/A 06/04/2015   Procedure: ESOPHAGOGASTRODUODENOSCOPY (EGD);  Surgeon: Milus Banister, MD;  Location: [facility];  Service: Endoscopy;  Laterality: N/A;  . LOBECTOMY  1971  . 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 He has never used smokeless tobacco. He reports that he does not drink alcohol or use drugs.  Allergies  Allergen Reactions  . Lisinopril Cough  . Quinidine   . Quinolones Other (See Comments)    Pt doesn't  recall    Family History  Problem Relation Age of Onset  . Lung cancer Father   . Emphysema Father   . Lung cancer Brother   . Leukemia Mother   . Heart attack Brother   . Kidney cancer Brother      Prior to Admission medications   Medication Sig Start Date End Date Taking? Authorizing Provider  aspirin 81 MG tablet Take 81 mg by mouth daily.     Yes Historical Provider, MD  atorvastatin (LIPITOR) 80 MG tablet Take 0.5 tablets (40 mg total) by mouth daily. 05/15/13  Yes Darlin Coco, MD  chlorpheniramine (CHLOR-TRIMETON) 4 MG tablet Take 4 mg by mouth 2 (two) times daily as needed for allergies.   Yes Historical Provider, MD  escitalopram (LEXAPRO) 5 MG tablet Take 5 mg by mouth daily.   Yes Historical Provider, MD  ferrous sulfate 325 (65 FE) MG tablet Take 1 tablet (325 mg total) by mouth daily with breakfast. 06/05/15  Yes Albertine Patricia, MD  furosemide (LASIX) 20 MG tablet Take 1 tablet (20 mg total) by mouth daily. 07/07/14  Yes Darlin Coco, MD  galantamine (RAZADYNE ER) 24 MG 24 hr capsule Take 1 capsule (24 mg total) by mouth daily with breakfast. 12/13/14  Yes Star Age, MD  lisinopril (PRINIVIL,ZESTRIL) 20 MG tablet Take 10 mg by mouth daily.     Yes Historical Provider, MD  omeprazole (PRILOSEC) 20 MG capsule Take 20 mg by mouth at bedtime.    Yes Historical Provider, MD  Polyethyl Glycol-Propyl Glycol (SYSTANE OP) Place 1 drop into both ears daily.   Yes Historical Provider, MD  acetaminophen (TYLENOL) 325 MG tablet Take 325-650 mg by mouth every 6 (six) hours as needed for mild pain, moderate pain or headache.     Historical Provider, MD  acidophilus (RISAQUAD) CAPS capsule Take 2 capsules by mouth daily. Patient not taking: Reported on 12/28/2015 06/05/15   Silver Huguenin Elgergawy, MD  amoxicillin (AMOXIL) 500 MG capsule Take 1 capsule (500 mg total) by mouth every 8 (eight) hours. Please take for 10 days. Patient not taking: Reported on 12/28/2015 06/05/15   Silver Huguenin  Elgergawy, MD  cyanocobalamin (,VITAMIN B-12,) 1000 MCG/ML injection Inject 1 mL (1,000 mcg total) into the muscle every 30 (thirty) days. 06/05/15   Silver Huguenin Elgergawy, MD  levothyroxine (SYNTHROID, LEVOTHROID) 25 MCG tablet TAKE 1 TABLET ONCE DAILY EXCEPT 2 TABLETS ON WEDNESDAY AND ON SUNDAY. 02/04/15   Darlin Coco, MD  Memantine HCl ER (NAMENDA XR) 21 MG CP24 Take 21 mg by mouth daily.    Historical Provider, MD  terazosin (HYTRIN) 2 MG capsule Take 4 mg by mouth at bedtime. 02/04/13   Darlin Coco, MD    Physical Exam: Vitals:   12/28/15 1815 12/28/15 1830 12/28/15 1845 12/28/15 2105  BP: 143/82 162/82 140/84   Pulse:    68  Resp: 16 25  20   Temp:      TempSrc:      SpO2:    94%      Constitutional: No respiratory distress, disoriented  Eyes: PERTLA, lids and conjunctivae normal ENMT: Mucous membranes are moist. Posterior pharynx clear of any exudate or lesions.   Neck: normal, supple, no masses, no thyromegaly Respiratory:  clear to auscultation bilaterally, no wheezing, no crackles. Normal respiratory effort.  Cardiovascular: S1 & S2 heard, regular rate and rhythm, soft systolic murmur at apex. No carotid bruits. No significant JVD. Abdomen: No distension, no tenderness, no masses palpated. Bowel sounds normal.  Musculoskeletal: no clubbing / cyanosis. No joint deformity upper and lower extremities. Normal muscle tone.  Skin: no significant rashes, lesions, ulcers. Warm, dry, well-perfused. Neurologic: CN 2-12 grossly intact. Sensation intact, DTR normal. Strength 5/5 in all 4 limbs.  Psychiatric: Oriented to person only; emotionally labile.     Labs on Admission: I have personally reviewed following labs and imaging studies  CBC:  Recent Labs Lab 12/28/15 1545  WBC 8.5  NEUTROABS 4.8  HGB 13.1  HCT 38.6*  MCV 92.6  PLT [id number]   Basic Metabolic Panel:  Recent Labs Lab 12/28/15 1545  NA 140  K 3.5  CL 107  CO2 25  GLUCOSE 100*  BUN 14  CREATININE  1.27*  CALCIUM 9.3   GFR: CrCl cannot be calculated (Unknown ideal weight.). Liver Function Tests:  Recent Labs Lab 12/28/15 1545  AST 26  ALT 18  ALKPHOS 98  BILITOT 0.5  PROT 7.2  ALBUMIN 3.8    Recent Labs Lab 12/28/15 1545  LIPASE 36    Recent Labs Lab 12/28/15 1545  AMMONIA 18   Coagulation Profile: No results for input(s): INR, PROTIME in the last 168 hours. Cardiac Enzymes: No results for input(s): CKTOTAL, CKMB, CKMBINDEX, TROPONINI in the last 168 hours. BNP (last 3 results) No results for input(s): PROBNP in the last 8760 hours. HbA1C: No results for input(s): HGBA1C in the last 72 hours. CBG:  Recent Labs Lab 12/28/15 1654  GLUCAP 99   Lipid Profile: No results for input(s): CHOL, HDL, LDLCALC, TRIG, CHOLHDL, LDLDIRECT in the last 72 hours. Thyroid Function Tests:  Recent Labs  12/28/15 1545  TSH 3.955   Anemia Panel: No results for input(s): VITAMINB12, FOLATE, FERRITIN, TIBC, IRON, RETICCTPCT in the last 72 hours. Urine analysis:    Component Value Date/Time   COLORURINE YELLOW 06/02/2015 1843   APPEARANCEUR CLOUDY (A) 06/02/2015 1843   LABSPEC 1.013 06/02/2015 1843   PHURINE 6.0 06/02/2015 1843   GLUCOSEU NEGATIVE 06/02/2015 1843   HGBUR NEGATIVE 06/02/2015 1843   BILIRUBINUR NEGATIVE 06/02/2015 1843   KETONESUR NEGATIVE 06/02/2015 1843   PROTEINUR NEGATIVE 06/02/2015 1843   NITRITE NEGATIVE 06/02/2015 1843   LEUKOCYTESUR MODERATE (A) 06/02/2015 1843   Sepsis Labs: @LABRCNTIP (procalcitonin:4,lacticidven:4) )No results found for this or any previous visit (from the past 240 hour(s)).   Radiological Exams on Admission: Dg Chest 2 View  Result Date: 12/28/2015 CLINICAL DATA:  Altered mental status EXAM: CHEST  2 VIEW COMPARISON:  06/02/2015 FINDINGS: Cardiac shadow is enlarged but stable. Elevation right hemidiaphragm is again seen. Chronic changes are identified in both lungs somewhat accentuated by poor inspiratory technique.  There may be some mildly superimpose to pulmonary edema present. No sizable effusion is noted. IMPRESSION: Diffuse chronic changes bilaterally. A slight increased density is noted likely related to mild pulmonary edema. Electronically Signed   By: Inez Catalina M.D.   On: 12/28/2015 17:45   Ct Head Wo Contrast  Result Date: 12/28/2015 CLINICAL DATA:  Dementia.  Altered mental status.  Gait disturbance. EXAM: CT HEAD WITHOUT CONTRAST TECHNIQUE: Contiguous axial images were obtained from the base of the skull through the vertex without intravenous contrast. COMPARISON:  MRI 12/16/2010 FINDINGS: Brain: There is mild age related atrophy. There are mild changes  of chronic small vessel disease affecting the deep white matter. No sign of cortical or large vessel territory infarction. No mass lesion, hemorrhage, hydrocephalus or extra-axial collection. Ventricular prominence is in proportion to the degree of atrophy. Vascular: There is atherosclerotic calcification of the major vessels at the base of the brain. Skull: No calvarial abnormality. Sinuses/Orbits: Mild mucosal thickening along the floors of the maxillary sinuses. Otherwise clear. Orbits negative. Other: Negative IMPRESSION: No acute finding by CT. Ordinary age related atrophy. Mild chronic small-vessel disease of the white matter. Electronically Signed   By: Nelson Chimes M.D.   On: 12/28/2015 16:37    EKG: Independently reviewed. Sinus rhythm, incomplete RBBB, LAFB, late R-transition; no significant change from prior   Assessment/Plan  1. Acute encephalopathy - Head CT negative, no significant electrolyte derangement, ammonia level and TSH normal  - Suspect there may be a UTI, but there has been some difficulty obtaining specimen and UA remains pending  - Follow-up UA and treat if indicated   2. CKD stage III  - SCr 1.27 on admission; appears to be consistent with his baseline - He was given a 20 mg IV Lasix push in ED  - Repeat chem panel in  am    3. GERD - Stable, normal EGD in March 2017  - Continue daily PPI   4. Hypertension  - At goal currently  - Monitor and treat prn   5. Depression - Difficult to assess on admission due to the clinical scenario  - Continue Lexapro  6. Chronic diastolic CHF - Appears euvolemic on admission after 20 mg IV Lasix given in ED  - TTE (06/29/15) with EF 55-60%, mild LVH, severe LAE  - Follow daily wts and I/O's, continue Lasix     DVT prophylaxis: sq heparin Code Status: DNR Family Communication: Nephew updated at bedside Disposition Plan: Admit to med-surg Consults called: None Admission status: Inpatient    Vianne Bulls, MD [facility] Pager [phone]  If 7PM-7AM, please contact night-coverage [email or web address] Password [email or web address]  12/28/2015, 9:10 PM

## 2015-12-28 NOTE — ED Triage Notes (Signed)
Per [facility]: Pt is from a skilled living facility, [facility]. Stated that the pt is now walking "with a shuffle" and this is not his normal. Pt was last seen normal last night. EMS stroke screen was negative. Pt does not have any slurred speech, droop, or drift. Pt had equal grip strength, and strength in all extremities. Pt is oriented to his name only, this is his normal. Pt was 90% on room air, EMS placed the pt on 2 L [place], oxygen saturation improved to 99%. Pt has a contusion on his forehead with a small scrape in the middle, the pt states that it hurts, he doesn't know what it is from or when it happened. Pt also has a small scrape on his nose.

## 2015-12-28 NOTE — ED Notes (Signed)
Pt continuing to attempt to get out of bed, family at the bedside. Pt redirected and calm at this time. Refusing to keep monitor on.

## 2015-12-28 NOTE — ED Notes (Signed)
CBG- 99 

## 2015-12-29 ENCOUNTER — Inpatient Hospital Stay (HOSPITAL_COMMUNITY): Payer: Medicare Other

## 2015-12-29 DIAGNOSIS — R4182 Altered mental status, unspecified: Secondary | ICD-10-CM

## 2015-12-29 DIAGNOSIS — G9340 Encephalopathy, unspecified: Principal | ICD-10-CM

## 2015-12-29 DIAGNOSIS — F0280 Dementia in other diseases classified elsewhere without behavioral disturbance: Secondary | ICD-10-CM

## 2015-12-29 DIAGNOSIS — G309 Alzheimer's disease, unspecified: Secondary | ICD-10-CM

## 2015-12-29 DIAGNOSIS — E039 Hypothyroidism, unspecified: Secondary | ICD-10-CM

## 2015-12-29 LAB — BASIC METABOLIC PANEL
ANION GAP: 10 (ref 5–15)
BUN: 14 mg/dL (ref 6–20)
CALCIUM: 9.4 mg/dL (ref 8.9–10.3)
CO2: 25 mmol/L (ref 22–32)
Chloride: 105 mmol/L (ref 101–111)
Creatinine, Ser: 1.25 mg/dL — ABNORMAL HIGH (ref 0.61–1.24)
GFR calc Af Amer: 58 mL/min — ABNORMAL LOW (ref >60)
GFR calc non Af Amer: 50 mL/min — ABNORMAL LOW (ref >60)
GLUCOSE: 80 mg/dL (ref 65–99)
Potassium: 3.4 mmol/L — ABNORMAL LOW (ref 3.5–5.1)
Sodium: 140 mmol/L (ref 135–145)

## 2015-12-29 LAB — MRSA PCR SCREENING: MRSA by PCR: NEGATIVE

## 2015-12-29 LAB — URINALYSIS, ROUTINE W REFLEX MICROSCOPIC
BILIRUBIN URINE: NEGATIVE
GLUCOSE, UA: NEGATIVE mg/dL
HGB URINE DIPSTICK: NEGATIVE
KETONES UR: NEGATIVE mg/dL
Leukocytes, UA: NEGATIVE
Nitrite: NEGATIVE
PH: 6.5 (ref 5.0–8.0)
PROTEIN: NEGATIVE mg/dL
Specific Gravity, Urine: 1.011 (ref 1.005–1.030)

## 2015-12-29 LAB — GLUCOSE, CAPILLARY: Glucose-Capillary: 81 mg/dL (ref 65–99)

## 2015-12-29 MED ORDER — ORAL CARE MOUTH RINSE
15.0000 mL | Freq: Two times a day (BID) | OROMUCOSAL | Status: DC
  Administered 2015-12-29 – 2015-12-31 (×4): 15 mL via OROMUCOSAL

## 2015-12-29 MED ORDER — FUROSEMIDE 20 MG PO TABS
20.0000 mg | ORAL_TABLET | Freq: Every day | ORAL | Status: DC
  Administered 2015-12-29: 20 mg via ORAL
  Filled 2015-12-29: qty 1

## 2015-12-29 NOTE — Care Management Note (Signed)
Case Management Note  Patient Details  Name: Michael Bradley MRN: [id number] Date of Birth: 11-29-1925  Subjective/Objective:                 Patient from [facility] memory unit. CM spoke with patient's niece Michael Bradley at the bedside, she states she is POA, patient's wife passed away and they had no children. She states she will bring his walker to ALF prior to his hospital discharge so he has access to it. He was very independent and did not need DME prior to admission.    Action/Plan:  Anticipate DC to [facility] with in house PT at [place]. CM will continue to follow.   Expected Discharge Date:                  Expected Discharge Plan:  Assisted Living / Rest Home (Memory Care unit at [facility])  In-House Referral:  Clinical Social Work  Discharge [place] Acute Care Choice:  NA Choice offered to:  [place] / Guardian Michael Bradley, neice)  DME Arranged:  N/A DME Agency:  NA  HH Arranged:   (if needed patient would use [facility]) [place]:     Status of Service:  In process, will continue to follow  If discussed at Long Length of Stay Meetings, dates discussed:    Additional Comments:  Carles Collet, RN 12/29/2015, 12:43 PM

## 2015-12-29 NOTE — Progress Notes (Signed)
EEG completed; results pending.    

## 2015-12-29 NOTE — Procedures (Signed)
History: 82 year old male being admitted with acute encephalopathy  Sedation: None  Technique: This is a 21 channel routine scalp EEG performed at the bedside with bipolar and monopolar montages arranged in accordance to the international 10/20 system of electrode placement. One channel was dedicated to EKG recording.    Background: There is slowing of the background rhythms with a posterior dominant rhythm of 7 Hz. There is also irregular generalized theta and delta activities as well as some frontally predominant beta. No epileptiform activity was seen. Sleep structures were seen in are symmetric.  Photic stimulation: Physiologic driving is not performed  EEG Abnormalities: 1) generalized irregular slow activity 2) slow PDR  Clinical Interpretation: This EEG is consistent with a generalized nonspecific cerebral dysfunction (encephalopathy). There was no seizure or seizure predisposition recorded on this study. Please note that a normal EEG does not preclude the possibility of epilepsy.   Roland Rack, MD [facility] [phone]  If 7pm- 7am, please page neurology on call as listed in [place].

## 2015-12-29 NOTE — Progress Notes (Signed)
PROGRESS NOTE    Michael Bradley  [id number] DOB: [birth year] DOA: 12/28/2015 PCP: No primary care provider on file.  Brief Narrative: Michael Bradley is a 82 y.o. male with medical history significant for dementia, hypertension, hypothyroidism, depression, and chronic diastolic CHF who presents to the emergency department from his SNF for evaluation of increased confusion, agitation, and unsteady gait. Patient is reportedly disoriented at baseline, but typically pleasant and cooperative. He is normally able to walk without assistance at baseline and can hold the basic conversation. He is noted by SNF staff today to be increasingly confused with unintelligible speech, agitation, and unsteady gait. There had been no known fall or trauma and the patient had not voiced any specific complaints. Patient was encephalopathic and was unable to provide a subjective history today. Spoke with HCPOA his niece and she stated that he didn't act like this yesterday.   Assessment & Plan:   Principal Problem:   Acute encephalopathy Active Problems:   Dementia   Hypothyroid   CKD (chronic kidney disease), stage III   Hypertension   GERD (gastroesophageal reflux disease)   Depression   Chronic diastolic CHF (congestive heart failure) (HCC)   Altered mental status  1. Acute Encephalopathy with Gait disturbances superimposed on Advanced Dementia  - Head CT negative and showed No acute finding by CT. Ordinary age related atrophy. Mild chronicsmall-vessel disease of the white matter. - No significant electrolyte derangement, ammonia level and TSH normal  - Urinalysis was clean.  - Discussed Case with Neurology PA Theodosia Paling and he recommended obtaining MRI and EEG; If patient still not improved will warrant a formal Consultation - In review of last records, patients MMSE was 13/30.  - Will Hold Galantamine ER and Memantine XR - MRI and EEG pending  - PT/OT Eval and Treat  2. Hypokalemia -Patient's  K+ was 3.4 -Repleted with po Kcl 40 mEQ -Recheck K+ in AM  3. CKD stage III  - Patient's BUN/Cr was 14/1.27 on admission; Repeat showed 14/1.25 - He was given a 20 mg IV Lasix push in ED  - Repeat chem panel in am    4. GERD - Stable, normal EGD in March 2017  - Continue daily PPI with Protonix 40 mg   5. Hypertension  - At goal currently  - Monitor and treat prn   6. Depression - Continue Escitalopram 5 mg po Daily  7. Chronic diastolic CHF, currently not in exacerbation - Appears euvolemic on admission after 20 mg IV Lasix given in ED  - TTE (06/29/15) with EF 55-60%, mild LVH, severe LAE  - BNP was 154.1 - Follow daily wts and I/O's, continue wit po 20 mg of Lasix    8. Hyperlipidemia - Continue Atorvastatin 40 mg po Daily  9. Hypothyroidism - TSH was 3.955 - C/w Levothyroxine 50 mcg   DVT prophylaxis: Heparin 5,000 sq Code Status: DNR Family Communication: Discussed plan of care with patients HCPOA his Niece. Disposition Plan: Likely Back to SNF.  Consultants:   Neurology - Telephone consultation with Theodosia Paling PA   Procedures: MRI and EEG pending  Antimicrobials:  None  Subjective: Patient was seen and examined this AM and was unable to provide a subjective Hx because of his Dementia. Would not appropriately respond to questioning. Was a little somnolent but arousable. Patient was more alert when his Niece was in the room. No other complaints or concerns.   Objective: Vitals:   12/28/15 2139 12/29/15 0512 12/29/15 0800 12/29/15 1326  BP: (!) 150/66 (!) 149/92 128/80 136/77  Pulse: (!) 52 (!) 59 (!) 53 (!) 56  Resp: 18 18  20   Temp: 98.4 F (36.9 C) 97.7 F (36.5 C) 98.2 F (36.8 C) 97.5 F (36.4 C)  TempSrc: Oral Oral Oral Oral  SpO2: 94% 97% 95% 97%  Weight:      Height:        Intake/Output Summary (Last 24 hours) at 12/29/15 1809 Last data filed at 12/29/15 0900  Gross per 24 hour  Intake               60 ml  Output                0  ml  Net               60 ml   Filed Weights   12/28/15 2105  Weight: 68 kg (150 lb)    Examination: Physical Exam:  Constitutional: WN/WD, NAD and laying in bed Eyes: PERRL, lids and conjunctivae normal, sclerae anicteric  ENMT: External Ears, Nose appear normal.  Mucous membranes are moist. . Poor dentition.  Neck: Appears normal, supple, no cervical masses, normal ROM, no appreciable thyromegaly, no JVD Respiratory: Clear to auscultation bilaterally, no wheezing, rales, rhonchi or crackles. Normal respiratory effort and patient is not tachypenic. No accessory muscle use.  Cardiovascular: RRR, no murmurs / rubs / gallops. S1 and S2 auscultated. No extremity edema.  Abdomen: Soft, non-tender, non-distended. No masses palpated. No appreciable hepatosplenomegaly. Bowel sounds positive.  GU: Deferred. Musculoskeletal: No clubbing / cyanosis of digits/nails. Patient wearing mittens Skin: No rashes, lesions, ulcers. No induration; Warm and dry.  Neurologic: CN 2-12 limited 2/2 current condition. Romberg sign cerebellar reflexes not assessed.  Psychiatric: Does not have judgment and insight. Unable to assess mood 2/2 to current condition.  Data Reviewed: I have personally reviewed following labs and imaging studies  CBC:  Recent Labs Lab 12/28/15 1545  WBC 8.5  NEUTROABS 4.8  HGB 13.1  HCT 38.6*  MCV 92.6  PLT A999333   Basic Metabolic Panel:  Recent Labs Lab 12/28/15 1545 12/29/15 0502  NA 140 140  K 3.5 3.4*  CL 107 105  CO2 25 25  GLUCOSE 100* 80  BUN 14 14  CREATININE 1.27* 1.25*  CALCIUM 9.3 9.4   GFR: Estimated Creatinine Clearance: 37.6 mL/min (by C-G formula based on SCr of 1.25 mg/dL (H)). Liver Function Tests:  Recent Labs Lab 12/28/15 1545  AST 26  ALT 18  ALKPHOS 98  BILITOT 0.5  PROT 7.2  ALBUMIN 3.8    Recent Labs Lab 12/28/15 1545  LIPASE 36    Recent Labs Lab 12/28/15 1545  AMMONIA 18   Coagulation Profile: No results for input(s):  INR, PROTIME in the last 168 hours. Cardiac Enzymes: No results for input(s): CKTOTAL, CKMB, CKMBINDEX, TROPONINI in the last 168 hours. BNP (last 3 results) No results for input(s): PROBNP in the last 8760 hours. HbA1C: No results for input(s): HGBA1C in the last 72 hours. CBG:  Recent Labs Lab 12/28/15 1654 12/29/15 0752  GLUCAP 99 81   Lipid Profile: No results for input(s): CHOL, HDL, LDLCALC, TRIG, CHOLHDL, LDLDIRECT in the last 72 hours. Thyroid Function Tests:  Recent Labs  12/28/15 1545  TSH 3.955   Anemia Panel: No results for input(s): VITAMINB12, FOLATE, FERRITIN, TIBC, IRON, RETICCTPCT in the last 72 hours. Sepsis Labs: No results for input(s): PROCALCITON, LATICACIDVEN in the last 168 hours.  Recent Results (  from the past 240 hour(s))  MRSA PCR Screening     Status: None   Collection Time: 12/28/15 11:31 PM  Result Value Ref Range Status   MRSA by PCR NEGATIVE NEGATIVE Final    Comment:        The GeneXpert MRSA Assay (FDA approved for NASAL specimens only), is one component of a comprehensive MRSA colonization surveillance program. It is not intended to diagnose MRSA infection nor to guide or monitor treatment for MRSA infections.     Radiology Studies: Dg Chest 2 View  Result Date: 12/28/2015 CLINICAL DATA:  Altered mental status EXAM: CHEST  2 VIEW COMPARISON:  06/02/2015 FINDINGS: Cardiac shadow is enlarged but stable. Elevation right hemidiaphragm is again seen. Chronic changes are identified in both lungs somewhat accentuated by poor inspiratory technique. There may be some mildly superimpose to pulmonary edema present. No sizable effusion is noted. IMPRESSION: Diffuse chronic changes bilaterally. A slight increased density is noted likely related to mild pulmonary edema. Electronically Signed   By: Inez Catalina M.D.   On: 12/28/2015 17:45   Ct Head Wo Contrast  Result Date: 12/28/2015 CLINICAL DATA:  Dementia.  Altered mental status.  Gait  disturbance. EXAM: CT HEAD WITHOUT CONTRAST TECHNIQUE: Contiguous axial images were obtained from the base of the skull through the vertex without intravenous contrast. COMPARISON:  MRI 12/16/2010 FINDINGS: Brain: There is mild age related atrophy. There are mild changes of chronic small vessel disease affecting the deep white matter. No sign of cortical or large vessel territory infarction. No mass lesion, hemorrhage, hydrocephalus or extra-axial collection. Ventricular prominence is in proportion to the degree of atrophy. Vascular: There is atherosclerotic calcification of the major vessels at the base of the brain. Skull: No calvarial abnormality. Sinuses/Orbits: Mild mucosal thickening along the floors of the maxillary sinuses. Otherwise clear. Orbits negative. Other: Negative IMPRESSION: No acute finding by CT. Ordinary age related atrophy. Mild chronic small-vessel disease of the white matter. Electronically Signed   By: Nelson Chimes M.D.   On: 12/28/2015 16:37   Scheduled Meds: . aspirin EC  81 mg Oral Daily  . atorvastatin  40 mg Oral Daily  . escitalopram  5 mg Oral Daily  . ferrous sulfate  325 mg Oral Q breakfast  . furosemide  20 mg Oral Daily  . galantamine  24 mg Oral Q breakfast  . heparin  5,000 Units Subcutaneous Q8H  . levothyroxine  50 mcg Oral QAC breakfast  . mouth rinse  15 mL Mouth Rinse BID  . memantine  21 mg Oral Daily  . pantoprazole  40 mg Oral Daily  . sodium chloride flush  3 mL Intravenous Q12H  . terazosin  4 mg Oral QHS   Continuous Infusions:    LOS: 1 day   Kerney Elbe, DO [facility] Pager [phone]  If 7PM-7AM, please contact night-coverage [email or web address] Password [email or web address] 12/29/2015, 6:09 PM

## 2015-12-30 DIAGNOSIS — Z7189 Other specified counseling: Secondary | ICD-10-CM

## 2015-12-30 LAB — CBC WITH DIFFERENTIAL/PLATELET
Basophils Absolute: 0 10*3/uL (ref 0.0–0.1)
Basophils Relative: 1 %
Eosinophils Absolute: 0.6 10*3/uL (ref 0.0–0.7)
Eosinophils Relative: 8 %
HEMATOCRIT: 40.5 % (ref 39.0–52.0)
HEMOGLOBIN: 13.3 g/dL (ref 13.0–17.0)
LYMPHS ABS: 2.4 10*3/uL (ref 0.7–4.0)
LYMPHS PCT: 32 %
MCH: 31.2 pg (ref 26.0–34.0)
MCHC: 32.8 g/dL (ref 30.0–36.0)
MCV: 95.1 fL (ref 78.0–100.0)
MONOS PCT: 17 %
Monocytes Absolute: 1.3 10*3/uL — ABNORMAL HIGH (ref 0.1–1.0)
NEUTROS ABS: 3.4 10*3/uL (ref 1.7–7.7)
NEUTROS PCT: 44 %
Platelets: 219 10*3/uL (ref 150–400)
RBC: 4.26 MIL/uL (ref 4.22–5.81)
RDW: 12.8 % (ref 11.5–15.5)
WBC: 7.7 10*3/uL (ref 4.0–10.5)

## 2015-12-30 LAB — COMPREHENSIVE METABOLIC PANEL
ALK PHOS: 93 U/L (ref 38–126)
ALT: 18 U/L (ref 17–63)
ANION GAP: 10 (ref 5–15)
AST: 28 U/L (ref 15–41)
Albumin: 3.8 g/dL (ref 3.5–5.0)
BILIRUBIN TOTAL: 0.6 mg/dL (ref 0.3–1.2)
BUN: 19 mg/dL (ref 6–20)
CALCIUM: 9.3 mg/dL (ref 8.9–10.3)
CO2: 28 mmol/L (ref 22–32)
CREATININE: 1.76 mg/dL — AB (ref 0.61–1.24)
Chloride: 104 mmol/L (ref 101–111)
GFR calc non Af Amer: 33 mL/min — ABNORMAL LOW (ref >60)
GFR, EST AFRICAN AMERICAN: 38 mL/min — AB (ref >60)
GLUCOSE: 100 mg/dL — AB (ref 65–99)
Potassium: 3.2 mmol/L — ABNORMAL LOW (ref 3.5–5.1)
Sodium: 142 mmol/L (ref 135–145)
TOTAL PROTEIN: 7.1 g/dL (ref 6.5–8.1)

## 2015-12-30 LAB — URINE CULTURE

## 2015-12-30 LAB — GLUCOSE, CAPILLARY: GLUCOSE-CAPILLARY: 91 mg/dL (ref 65–99)

## 2015-12-30 LAB — LACTIC ACID, PLASMA
Lactic Acid, Venous: 0.8 mmol/L (ref 0.5–1.9)
Lactic Acid, Venous: 1.1 mmol/L (ref 0.5–1.9)

## 2015-12-30 LAB — MAGNESIUM: Magnesium: 2.2 mg/dL (ref 1.7–2.4)

## 2015-12-30 LAB — PHOSPHORUS: Phosphorus: 4.8 mg/dL — ABNORMAL HIGH (ref 2.5–4.6)

## 2015-12-30 MED ORDER — SODIUM CHLORIDE 0.9 % IV SOLN
INTRAVENOUS | Status: AC
  Administered 2015-12-30 – 2015-12-31 (×2): via INTRAVENOUS

## 2015-12-30 MED ORDER — POTASSIUM CHLORIDE 20 MEQ PO PACK
40.0000 meq | PACK | Freq: Two times a day (BID) | ORAL | Status: AC
  Administered 2015-12-30 – 2015-12-31 (×3): 40 meq via ORAL
  Filled 2015-12-30 (×3): qty 2

## 2015-12-30 NOTE — Progress Notes (Signed)
PROGRESS NOTE    Michael Bradley  [id number] DOB: 08-23-1925 DOA: 12/28/2015 PCP: No primary care provider on file.  Brief Narrative: Michael Bradley is a 82 y.o. male with medical history significant for dementia, hypertension, hypothyroidism, depression, and chronic diastolic CHF who presents to the emergency department from his SNF for evaluation of increased confusion, agitation, and unsteady gait. Patient is reportedly disoriented at baseline, but typically pleasant and cooperative. He is normally able to walk without assistance at baseline and can hold the basic conversation. He is noted by SNF staff today to be increasingly confused with unintelligible speech, agitation, and unsteady gait. There had been no known fall or trauma and the patient had not voiced any specific complaints. Patient was encephalopathic and was unable to provide a subjective history today. Spoke with HCPOA his niece today and she would like to talk to Palliative Care.   Assessment & Plan:   Principal Problem:   Acute encephalopathy Active Problems:   Dementia   Hypothyroid   CKD (chronic kidney disease), stage III   Hypertension   GERD (gastroesophageal reflux disease)   Depression   Chronic diastolic CHF (congestive heart failure) (HCC)   Altered mental status  1. Acute Encephalopathy with Gait disturbances superimposed on Advanced Dementia  - Head CT negative and showed No acute finding by CT. Ordinary age related atrophy. Mild chronicsmall-vessel disease of the white matter. - No significant electrolyte derangement, ammonia level and TSH normal  - Urinalysis was clean.  - Discussed Case with Neurology PA Theodosia Paling and he recommended obtaining MRI and EEG; If patient still not improved will warrant a formal Consultation - In review of last records, patients MMSE was 13/30.  - Will Hold Galantamine ER and Memantine XR - MRI showed No acute intracranial abnormality. Evidence of chronic small  vessel disease with mild progression since 2012. Interval cerebral volume loss which appears fairly generalized. - EEG showed consistent with a generalized nonspecific cerebral dysfunction (encephalopathy). There was no seizure or seizure predisposition recorded on this study. Please note that a normal EEG does not preclude the possibility of epilepsy - PT/OT Eval and Treat - Consulted Palliative Care at request of HCPOA as patient's declining status.   2. Hypokalemia -Patient's K+ was 3.2 this AM -Repleted with po Kcl 40 mEQ x2  -Recheck K+ in AM  3. CKD stage III  - Patient's BUN/Cr was 14/1.27 on admission; Repeat showed 19/1.76 - He was given a 20 mg IV Lasix push in Ed; D/C'd and started low IVF. - Repeat chem panel in am    4. GERD - Stable, normal EGD in March 2017  - Continue daily PPI with Protonix 40 mg   5. Hypertension  - At goal currently  - Monitor and treat prn   6. Depression - Continue Escitalopram 5 mg po Daily  7. Chronic diastolic CHF, currently not in exacerbation - TTE (06/29/15) with EF 55-60%, mild LVH, severe LAE  - BNP was 154.1 - Follow daily wts and I/O's; Giving Low Dose IVF as he seemed dry today.   8. Hyperlipidemia - Continue Atorvastatin 40 mg po Daily  9. Hypothyroidism - TSH was 3.955 - C/w Levothyroxine 50 mcg   DVT prophylaxis: Heparin 5,000 sq Code Status: DNR Family Communication: Discussed plan of care with patients HCPOA his Niece. Disposition Plan: Likely Back to SNF.  Consultants:   Neurology - Telephone consultation with Theodosia Paling PA   Procedures: MRI and EEG pending  Antimicrobials:  None  Subjective: Patient was seen and examined this AM and was more alert but still unable to provide a subjective Hx because of his Dementia. Would answer some questions. Discussed the plan of Care with his Niece and HCPOA and she wanted to have Palliative Care Consultation. No other complaints or concerns.   Objective: Vitals:     12/29/15 1326 12/29/15 2127 12/30/15 0558 12/30/15 1410  BP: 136/77 139/76 (!) 119/51 115/60  Pulse: (!) 56 (!) 51 (!) 56 (!) 56  Resp: 20 18 17 15   Temp: 97.5 F (36.4 C) 98 F (36.7 C) (!) 96.8 F (36 C) 98.1 F (36.7 C)  TempSrc: Oral Oral Axillary Oral  SpO2: 97% 100% 97% 90%  Weight:   65.9 kg (145 lb 4.8 oz)   Height:        Intake/Output Summary (Last 24 hours) at 12/30/15 1417 Last data filed at 12/30/15 [id number]  Gross per 24 hour  Intake                0 ml  Output              900 ml  Net             -900 ml   Filed Weights   12/28/15 2105 12/30/15 0558  Weight: 68 kg (150 lb) 65.9 kg (145 lb 4.8 oz)    Examination: Physical Exam:  Constitutional: WN/WD, NAD and laying in bed with hand mitts Eyes: lids and conjunctivae normal, sclerae anicteric  ENMT: External Ears, Nose appear normal.  Mucous membranes are moist. Poor dentition.  Neck: Appears normal, supple, no cervical masses, normal ROM, no appreciable thyromegaly, no JVD Respiratory: Clear to auscultation bilaterally, no wheezing, rales, rhonchi or crackles. Normal respiratory effort and patient is not tachypenic. No accessory muscle use.  Cardiovascular: RRR, no murmurs / rubs / gallops. S1 and S2 auscultated. No extremity edema.  Abdomen: Soft, non-tender, non-distended. No masses palpated. No appreciable hepatosplenomegaly. Bowel sounds positive.  GU: Deferred. Musculoskeletal: No clubbing / cyanosis of digits/nails. Patient wearing mittens Skin: No rashes, lesions, ulcers. No induration; Warm and dry.  Neurologic: CN 2-12 limited 2/2 current condition. Romberg sign cerebellar reflexes not assessed. Sensation seemed intact in all 4 extremities.  Psychiatric: Does not have judgment and insight. Pleasant mood and affect.   Data Reviewed: I have personally reviewed following labs and imaging studies  CBC:  Recent Labs Lab 12/28/15 1545 12/30/15 0529  WBC 8.5 7.7  NEUTROABS 4.8 3.4  HGB 13.1 13.3   HCT 38.6* 40.5  MCV 92.6 95.1  PLT 217 [id number]   Basic Metabolic Panel:  Recent Labs Lab 12/28/15 1545 12/29/15 0502 12/30/15 0529  NA 140 140 142  K 3.5 3.4* 3.2*  CL 107 105 104  CO2 25 25 28   GLUCOSE 100* 80 100*  BUN 14 14 19   CREATININE 1.27* 1.25* 1.76*  CALCIUM 9.3 9.4 9.3  MG  --   --  2.2  PHOS  --   --  4.8*   GFR: Estimated Creatinine Clearance: 26.7 mL/min (by C-G formula based on SCr of 1.76 mg/dL (H)). Liver Function Tests:  Recent Labs Lab 12/28/15 1545 12/30/15 0529  AST 26 28  ALT 18 18  ALKPHOS 98 93  BILITOT 0.5 0.6  PROT 7.2 7.1  ALBUMIN 3.8 3.8    Recent Labs Lab 12/28/15 1545  LIPASE 36    Recent Labs Lab 12/28/15 1545  AMMONIA 18   Coagulation Profile: No results for input(s):  INR, PROTIME in the last 168 hours. Cardiac Enzymes: No results for input(s): CKTOTAL, CKMB, CKMBINDEX, TROPONINI in the last 168 hours. BNP (last 3 results) No results for input(s): PROBNP in the last 8760 hours. HbA1C: No results for input(s): HGBA1C in the last 72 hours. CBG:  Recent Labs Lab 12/28/15 1654 12/29/15 0752 12/30/15 0804  GLUCAP 99 81 91   Lipid Profile: No results for input(s): CHOL, HDL, LDLCALC, TRIG, CHOLHDL, LDLDIRECT in the last 72 hours. Thyroid Function Tests:  Recent Labs  12/28/15 1545  TSH 3.955   Anemia Panel: No results for input(s): VITAMINB12, FOLATE, FERRITIN, TIBC, IRON, RETICCTPCT in the last 72 hours. Sepsis Labs:  Recent Labs Lab 12/30/15 0851 12/30/15 1154  LATICACIDVEN 0.8 1.1    Recent Results (from the past 240 hour(s))  MRSA PCR Screening     Status: None   Collection Time: 12/28/15 11:31 PM  Result Value Ref Range Status   MRSA by PCR NEGATIVE NEGATIVE Final    Comment:        The GeneXpert MRSA Assay (FDA approved for NASAL specimens only), is one component of a comprehensive MRSA colonization surveillance program. It is not intended to diagnose MRSA infection nor to guide or monitor  treatment for MRSA infections.   Culture, Urine     Status: Abnormal   Collection Time: 12/29/15  1:06 AM  Result Value Ref Range Status   Specimen Description URINE, RANDOM  Final   Special Requests NONE  Final   Culture MULTIPLE SPECIES PRESENT, SUGGEST RECOLLECTION (A)  Final   Report Status 12/30/2015 FINAL  Final    Radiology Studies: Dg Chest 2 View  Result Date: 12/28/2015 CLINICAL DATA:  Altered mental status EXAM: CHEST  2 VIEW COMPARISON:  06/02/2015 FINDINGS: Cardiac shadow is enlarged but stable. Elevation right hemidiaphragm is again seen. Chronic changes are identified in both lungs somewhat accentuated by poor inspiratory technique. There may be some mildly superimpose to pulmonary edema present. No sizable effusion is noted. IMPRESSION: Diffuse chronic changes bilaterally. A slight increased density is noted likely related to mild pulmonary edema. Electronically Signed   By: Inez Catalina M.D.   On: 12/28/2015 17:45   Ct Head Wo Contrast  Result Date: 12/28/2015 CLINICAL DATA:  Dementia.  Altered mental status.  Gait disturbance. EXAM: CT HEAD WITHOUT CONTRAST TECHNIQUE: Contiguous axial images were obtained from the base of the skull through the vertex without intravenous contrast. COMPARISON:  MRI 12/16/2010 FINDINGS: Brain: There is mild age related atrophy. There are mild changes of chronic small vessel disease affecting the deep white matter. No sign of cortical or large vessel territory infarction. No mass lesion, hemorrhage, hydrocephalus or extra-axial collection. Ventricular prominence is in proportion to the degree of atrophy. Vascular: There is atherosclerotic calcification of the major vessels at the base of the brain. Skull: No calvarial abnormality. Sinuses/Orbits: Mild mucosal thickening along the floors of the maxillary sinuses. Otherwise clear. Orbits negative. Other: Negative IMPRESSION: No acute finding by CT. Ordinary age related atrophy. Mild chronic  small-vessel disease of the white matter. Electronically Signed   By: Nelson Chimes M.D.   On: 12/28/2015 16:37   Mr Brain Wo Contrast  Result Date: 12/29/2015 CLINICAL DATA:  82 year old male with altered mental status. Increased confusion and agitation. Initial encounter. EXAM: MRI HEAD WITHOUT CONTRAST TECHNIQUE: Multiplanar, multiecho pulse sequences of the brain and surrounding structures were obtained without intravenous contrast. COMPARISON:  Head CT without contrast 12/28/2015. Brain MRI 12/16/2010. FINDINGS: Brain: No restricted  diffusion to suggest acute infarction. No midline shift, mass effect, evidence of mass lesion, ventriculomegaly, extra-axial collection or acute intracranial hemorrhage. Cervicomedullary junction and pituitary are within normal limits. Cerebral volume loss since 2012, appears fairly generalized. Chronic increased T2 signal in the deep gray matter nuclei appears primarily due to perivascular spaces. Progressed and mildly confluent cerebral white matter T2 and FLAIR hyperintensity which is mostly periventricular. There is some deep white matter capsule involvement. No cortical encephalomalacia. No chronic cerebral blood products identified. Negative brainstem and cerebellum. Vascular: Major intracranial vascular flow voids appear stable with a mild to moderate degree of generalized intracranial artery dolichoectasia. Skull and upper cervical spine: Negative. Normal bone marrow signal. Sinuses/Orbits: Chronic maxillary sinus mucous retention cyst. Other paranasal sinuses remain clear. Orbits soft tissues appear stable. Negative scalp soft tissues. Other: Visible internal auditory structures appear normal. Mastoids are clear. IMPRESSION: 1.  No acute intracranial abnormality. 2. Evidence of chronic small vessel disease with mild progression since 2012. Interval cerebral volume loss which appears fairly generalized. Electronically Signed   By: Genevie Ann M.D.   On: 12/29/2015 19:36    Scheduled Meds: . aspirin EC  81 mg Oral Daily  . atorvastatin  40 mg Oral Daily  . escitalopram  5 mg Oral Daily  . ferrous sulfate  325 mg Oral Q breakfast  . heparin  5,000 Units Subcutaneous Q8H  . levothyroxine  50 mcg Oral QAC breakfast  . mouth rinse  15 mL Mouth Rinse BID  . pantoprazole  40 mg Oral Daily  . potassium chloride  40 mEq Oral BID WC  . sodium chloride flush  3 mL Intravenous Q12H  . terazosin  4 mg Oral QHS   Continuous Infusions: . sodium chloride 75 mL/hr at 12/30/15 1319     LOS: 2 days   Kerney Elbe, DO [facility] Pager [phone]  If 7PM-7AM, please contact night-coverage [email or web address] Password [email or web address] 12/30/2015, 2:17 PM

## 2015-12-30 NOTE — Consult Note (Signed)
Consultation Note Date: 12/30/2015   Patient Name: Michael Bradley  DOB: December 01, 1925  MRN: [id number]  Age / Sex: 82 y.o., male  PCP: No primary care provider on file. Referring Physician: Kerney Elbe, DO  Reason for Consultation: Establishing goals of care  HPI/Patient Profile: 82 y.o. male  with past medical history of advanced dementia, depression, hypothyroidism, chronic diastolic CHF admitted on 81/27/5170 with increasing agitation and gait disturbance.  Workup thus far has been negative for any acute abnormalities as etiology of mental status changes. Palliative medicine consulted per family request for [place] discussion.  Clinical Assessment and Goals of Care: Met with patient's nephew, [name]. He has been primary caretaker for patient along with his sister, [name] Chauncey Reading- resides in [place]). Notes patient has been progressively declining over last year with significant declines after the death of his wife. Declines have been noted in his level of functioning and cognition. He is no longer able to complete ADL's including bathing and toileting. [name] reports that prior to admission patient became 'unable to walk'.  [name] notes patient has history of vertigo and he is wondering if this has reoccured. He speaks very little, and frequently his speech is unintelligible. His nutrition status is maintaining, and patient continues to feed himself. Discussed dementia trajectory with [name] and future decisions regarding patient's care. [name] would like palliative medicine team to consult with [name] who is the [facility] via telephone. Kathryne Hitch, but she was driving and requested followup phone discussion tomorrow morning.   Primary Decision Maker HCPOA- Gloriann Loan- niece    SUMMARY OF RECOMMENDATIONS -Recommend primary evaluate for vertigo as cause of gait disturbance -DNR -PMT will continue [place] discussion with  [name] (HCPOA)    Code Status/Advance Care Planning:  DNR    Symptom Management:   No recs at this time  Palliative Prophylaxis:   Delirium Protocol  Additional Recommendations (Limitations, Scope, Preferences):    Psycho-social/Spiritual:   Desire for further Chaplaincy support:No  Additional Recommendations: Caregiving  Support/Resources  Prognosis:    < 12 months  Discharge Planning: Skilled Nursing facility with Palliative f/u  Primary Diagnoses: Present on Admission: . Acute encephalopathy . Dementia . Hypothyroid . CKD (chronic kidney disease), stage III . Hypertension . GERD (gastroesophageal reflux disease) . Depression . Chronic diastolic CHF (congestive heart failure) ([place])   I have reviewed the medical record, interviewed the patient and family, and examined the patient. The following aspects are pertinent.  Past Medical History:  Diagnosis Date  . Abnormal chest x-ray 06/2014   chronic stable bilateral interstitial opacities without focal consolidation  . Chest pain, atypical   . Dementia 2011  . ED (erectile dysfunction)   . History of echocardiogram    Echo 4/17: Mild LVH, focal basal hypertrophy, EF 55-60%, normal wall motion, calcified AV annulus, no aortic stenosis, severe LAE  . History of kidney stones   . Hyperlipidemia 2013  . Hypertension   . Hypothyroidism   . 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  . Microcytic anemia 12/2014  . Palpitations    Social History   Social History  . Marital status: Married    Spouse name: N/A  . Number of children: 0  . Years of education: 12   Occupational History  .  Retired   Social History Main Topics  . Smoking status: Former Smoker    Quit date: 11/28/1980  . Smokeless tobacco: Never Used  . Alcohol use No  . Drug use: No  . Sexual activity: Not Asked   Other Topics Concern  . None   Social History Narrative   Pt lives at home with his  spouse.   Caffeine Use: 2 cups of caffeine per day.    Family History  Problem Relation Age of Onset  . Lung cancer Father   . Emphysema Father   . Lung cancer Brother   . Leukemia Mother   . Heart attack Brother   . Kidney cancer Brother    Scheduled Meds: . aspirin EC  81 mg Oral Daily  . atorvastatin  40 mg Oral Daily  . escitalopram  5 mg Oral Daily  . ferrous sulfate  325 mg Oral Q breakfast  . heparin  5,000 Units Subcutaneous Q8H  . levothyroxine  50 mcg Oral QAC breakfast  . mouth rinse  15 mL Mouth Rinse BID  . pantoprazole  40 mg Oral Daily  . potassium chloride  40 mEq Oral BID WC  . sodium chloride flush  3 mL Intravenous Q12H  . terazosin  4 mg Oral QHS   Continuous Infusions: . sodium chloride 75 mL/hr at 12/30/15 1319   PRN Meds:.sodium chloride, acetaminophen **OR** acetaminophen, bisacodyl, HYDROcodone-acetaminophen, ondansetron **OR** ondansetron (ZOFRAN) IV, polyethylene glycol, sodium chloride flush Medications Prior to Admission:  Prior to Admission medications   Medication Sig Start Date End Date Taking? Authorizing Provider  aspirin 81 MG tablet Take 81 mg by mouth daily.     Yes Historical Provider, MD  atorvastatin (LIPITOR) 80 MG tablet Take 0.5 tablets (40 mg total) by mouth daily. 05/15/13  Yes Darlin Coco, MD  chlorpheniramine (CHLOR-TRIMETON) 4 MG tablet Take 4 mg by mouth 2 (two) times daily as needed for allergies.   Yes Historical Provider, MD  escitalopram (LEXAPRO) 5 MG tablet Take 5 mg by mouth daily.   Yes Historical Provider, MD  ferrous sulfate 325 (65 FE) MG tablet Take 1 tablet (325 mg total) by mouth daily with breakfast. 06/05/15  Yes Albertine Patricia, MD  furosemide (LASIX) 20 MG tablet Take 1 tablet (20 mg total) by mouth daily. 07/07/14  Yes Darlin Coco, MD  galantamine (RAZADYNE ER) 24 MG 24 hr capsule Take 1 capsule (24 mg total) by mouth daily with breakfast. 12/13/14  Yes Star Age, MD  lisinopril (PRINIVIL,ZESTRIL) 20  MG tablet Take 10 mg by mouth daily.     Yes Historical Provider, MD  omeprazole (PRILOSEC) 20 MG capsule Take 20 mg by mouth at bedtime.    Yes Historical Provider, MD  Polyethyl Glycol-Propyl Glycol (SYSTANE OP) Place 1 drop into both ears daily.   Yes Historical Provider, MD  acetaminophen (TYLENOL) 325 MG tablet Take 325-650 mg by mouth every 6 (six) hours as needed for mild pain, moderate pain or headache.     Historical Provider, MD  acidophilus (RISAQUAD) CAPS capsule Take 2 capsules by mouth daily. Patient not taking: Reported on 12/28/2015 06/05/15   Albertine Patricia, MD  amoxicillin (AMOXIL) 500  MG capsule Take 1 capsule (500 mg total) by mouth every 8 (eight) hours. Please take for 10 days. Patient not taking: Reported on 12/28/2015 06/05/15   Silver Huguenin Elgergawy, MD  cyanocobalamin (,VITAMIN B-12,) 1000 MCG/ML injection Inject 1 mL (1,000 mcg total) into the muscle every 30 (thirty) days. 06/05/15   Silver Huguenin Elgergawy, MD  levothyroxine (SYNTHROID, LEVOTHROID) 25 MCG tablet TAKE 1 TABLET ONCE DAILY EXCEPT 2 TABLETS ON WEDNESDAY AND ON 'SUNDAY. 02/04/15   Thomas Brackbill, MD  Memantine HCl ER (NAMENDA XR) 21 MG CP24 Take 21 mg by mouth daily.    Historical Provider, MD  terazosin (HYTRIN) 2 MG capsule Take 4 mg by mouth at bedtime. 02/04/13   Thomas Brackbill, MD   Allergies  Allergen Reactions  . Lisinopril Cough  . Quinidine   . Quinolones Other (See Comments)    Pt doesn\'t recall   Review of Systems  Unable to perform ROS: Dementia    Physical Exam  Constitutional: He appears well-developed and well-nourished. No distress.  Cardiovascular: Normal rate and regular rhythm.   Pulmonary/Chest: Effort normal and breath sounds normal.  Musculoskeletal: Normal range of motion.  Neurological:  Pleasantly confused, limited verbal responses  Skin: Skin is warm and dry.    Vital Signs: BP 115/60 (BP Location: Right Arm)   Pulse (!) 56   Temp 98.1 F (36.7 C) (Oral)   Resp 15    Ht 5\' 6" (1.676 m)   Wt 65.9 kg (145 lb 4.8 oz)   SpO2 90%   BMI 23.45 kg/m  Pain Assessment: No/denies pain   Pain Score: 0-No pain   SpO2: SpO2: 90 % O2 Device:SpO2: 90 % O2 Flow Rate: .O2 Flow Rate (L/min): 3 L/min  IO: Intake/output summary:   Intake/Output Summary (Last 24 hours) at 12/30/15 1619 Last data filed at 12/30/15 0713  Gross per 24 hour  Intake                0 ml  Output              90' 0 ml  Net             -900 ml    LBM: Last BM Date: 12/29/15 Baseline Weight: Weight: 68 kg (150 lb) Most recent weight: Weight: 65.9 kg (145 lb 4.8 oz)     Palliative Assessment/Data: PPS: 50%   Flowsheet Rows   Flowsheet Row Most Recent Value  Intake Tab  Referral Department  Hospitalist  Unit at Time of Referral  Med/Surg Unit  Palliative Care Primary Diagnosis  Cardiac  Date Notified  12/30/15  Palliative Care Type  New Palliative care  Reason for referral  Clarify Goals of Care  Date of Admission  12/28/15  # of days IP prior to Palliative referral  2  Clinical Assessment  Psychosocial & Spiritual Assessment  Palliative Care Outcomes      Thank you for this consult. Palliative medicine will continue to follow and assist as needed.   Time In: 1500  Time Out: 1600  Time Total: 60 minutes Greater than 50%  of this time was spent counseling and coordinating care related to the above assessment and plan.  Signed by: Mariana Kaufman, AGNP-C Palliative Medicine    Please contact Palliative Medicine Team phone at [phone] for questions and concerns.  For individual provider: See Shea Evans

## 2015-12-30 NOTE — Evaluation (Signed)
Physical Therapy Evaluation Patient Details Name: Michael Bradley MRN: [id number] DOB: 08-07-1925 Today's Date: 12/30/2015   History of Present Illness  Patient is an 82 yo male admitted 12/28/15 with acute encephalopathy, agitation, and gait disturbance from [facility] ALF memory care.   PMH:  dementia, HTN, depression, CHF, CKD    Clinical Impression  Patient presents with problems listed below.  Will benefit from acute PT to maximize functional mobility prior to discharge.  Patient was ambulatory with no assistive device pta.  Today required +2 min assist for gait.  Recommend HHPT for continued therapy at d/c back to ALF.    Follow Up Recommendations Home health PT;Supervision/Assistance - 24 hour (HHPT at ALF)    Equipment Recommendations  None recommended by PT    Recommendations for Other Services       Precautions / Restrictions Precautions Precautions: Fall Restrictions Weight Bearing Restrictions: No      Mobility  Bed Mobility Overal bed mobility: Needs Assistance Bed Mobility: Supine to Sit;Sit to Supine     Supine to sit: Mod assist;HOB elevated Sit to supine: Min assist   General bed mobility comments: Verbal and tactile cues to move to EOB.  Patient required mult verbal cues before initiating movement.  Assist to bring LE's off of bed and to raise trunk to sitting.  Assist to scoot to EOB in sitting.  Good sitting balance once upright.  Min assist to bring LE's onto bed to return to supine.  Transfers Overall transfer level: Needs assistance Equipment used: 2 person hand held assist Transfers: Sit to/from Stand Sit to Stand: Min assist;+2 physical assistance         General transfer comment: Verbal and tactile cues to move to standing.  Assist to rise to standing and for balance.  Patient leaning to left and posteriorly.  Ambulation/Gait Ambulation/Gait assistance: Min assist;+2 physical assistance Ambulation Distance (Feet): 62 Feet Assistive  device: 2 person hand held assist;Rolling walker (2 wheeled) Gait Pattern/deviations: Step-through pattern;Decreased stride length;Shuffle;Trunk flexed;Drifts right/left Gait velocity: decreased Gait velocity interpretation: Below normal speed for age/gender General Gait Details: Attempted ambulation with RW.  Patient kept RW too far ahead of himself, causing feet to be outside of RW and trunk flexed.  Required mod to max assist to maneuver RW to avoid obstacles.  Patient stood at sink to wash hands.  Ambulated sink to bed with hand-hold assist.  Patient unable to automatically recall safe use of RW.  Patient ambulated on room air, with O2 sats decreasing to 81%.  Reapplied O2 via [place] with O2 sats returning to 90's within 60 seconds.  Stairs            Wheelchair Mobility    Modified Rankin (Stroke Patients Only)       Balance Overall balance assessment: Needs assistance Sitting-balance support: No upper extremity supported;Feet supported Sitting balance-Leahy Scale: Good     Standing balance support: No upper extremity supported;During functional activity Standing balance-Leahy Scale: Fair                               Pertinent Vitals/Pain Pain Assessment: Faces Faces Pain Scale: No hurt    Home Living Family/patient expects to be discharged to:: Assisted living               Home Equipment: Walker - 4 wheels;Wheelchair - manual      Prior Function Level of Independence: Independent;Needs assistance   Gait /  Transfers Assistance Needed: Ambulatory with no assistive device  ADL's / Homemaking Assistance Needed: Assist all ADL's.  Can feed himself.        Hand Dominance        Extremity/Trunk Assessment   Upper Extremity Assessment: Defer to OT evaluation           Lower Extremity Assessment: Generalized weakness         Communication   Communication: Expressive difficulties (Difficult to understand)  Cognition Arousal/Alertness:  Awake/alert Behavior During Therapy: Restless Overall Cognitive Status: History of cognitive impairments - at baseline                      General Comments      Exercises     Assessment/Plan    PT Assessment Patient needs continued PT services  PT Problem List Decreased strength;Decreased activity tolerance;Decreased balance;Decreased mobility;Decreased cognition;Decreased knowledge of use of DME;Decreased safety awareness;Cardiopulmonary status limiting activity          PT Treatment Interventions DME instruction;Gait training;Functional mobility training;Therapeutic activities;Therapeutic exercise;Patient/family education    PT Goals (Current goals can be found in the Care Plan section)  Acute Rehab PT Goals Patient Stated Goal: Unable to state PT Goal Formulation: With family Time For Goal Achievement: 01/06/16 Potential to Achieve Goals: Good    Frequency Min 3X/week   Barriers to discharge        Co-evaluation PT/OT/SLP Co-Evaluation/Treatment: Yes Reason for Co-Treatment: For patient/therapist safety;Necessary to address cognition/behavior during functional activity PT goals addressed during session: Mobility/safety with mobility         End of Session Equipment Utilized During Treatment: Gait belt;Oxygen Activity Tolerance: Patient limited by fatigue Patient left: in bed;with call bell/phone within reach;with bed alarm set;with restraints reapplied;with family/visitor present (mits reapplied) Nurse Communication: Mobility status (O2 sats dropped with activity on room air)         Time: [id number] PT Time Calculation (min) (ACUTE ONLY): 24 min   Charges:   PT Evaluation $PT Eval Moderate Complexity: 1 Procedure     PT G CodesDespina Pole 2016/01/06, 12:04 PM Carita Pian. Sanjuana Kava, [place] Pager [phone]

## 2015-12-30 NOTE — Clinical Social Work Note (Signed)
Clinical Social Work Assessment  Patient Details  Name: Michael Bradley MRN: [id number] Date of Birth: [birth year]  Date of referral:  12/30/15               Reason for consult:  Discharge Planning                Permission sought to share information with:  Facility Sport and exercise psychologist, Family Supports Permission granted to share information::  No (Patient is disoriented)  Name::     Animal nutritionist::  [facility]   Relationship::  HCPOA  Contact Information:  [phone]  Housing/Transportation Living arrangements for the past 2 months:  [facility] of Information:  Power of Attorney Patient Interpreter Needed:  None Criminal Activity/Legal Involvement Pertinent to Current Situation/Hospitalization:  No - Comment as needed Significant Relationships:  Other Family Members Lives with:  Facility Resident Do you feel safe going back to the place where you live?  Yes Need for family participation in patient care:  Yes (Comment)  Care giving concerns:  CSW received consult regarding discharge planning. Patient is disoriented. CSW spoke with patient's niece, [name]. Patient is from [facility] memory care and will return there at discharge. CSW to continue to follow for discharge needs.    Social Worker assessment / plan:  Patient to return to [employer] by car at discharge.   Employment status:  Retired Forensic scientist:  Medicare PT Recommendations:  Not assessed at this time Information / Referral to community resources:     Patient/Family's Response to care:  Patient's niece and her brother are very involved in his care.  Patient/Family's Understanding of and Emotional Response to Diagnosis, Current Treatment, and Prognosis:  No questions/concerns at this time. Patient's niece is hopeful for a quick recovery.   Emotional Assessment Appearance:  Appears stated age Attitude/Demeanor/Rapport:  Unable to Assess Affect (typically observed):   Unable to Assess Orientation:  Oriented to Self Alcohol / Substance use:  Not Applicable Psych involvement (Current and /or in the community):  No (Comment)  Discharge Needs  Concerns to be addressed:  Care Coordination Readmission within the last 30 days:  No Current discharge risk:  None Barriers to Discharge:  Continued Medical Work up   [employer], [place] 12/30/2015, 9:08 AM

## 2015-12-30 NOTE — Progress Notes (Signed)
Occupational Therapy Evaluation Patient Details Name: Michael Bradley MRN: [id number] DOB: Jun 10, 1925 Today's Date: 12/30/2015    History of Present Illness Patient is an 82 yo male admitted 12/28/15 with acute encephalopathy, agitation, and gait disturbance from [facility] ALF memory care.   PMH:  dementia, HTN, depression, CHF, CKD   Clinical Impression   Patient at or close to baseline for ADLs. No further acute OT needs. Recommend 24/7 assistance at ALF/memory care unit with HHOT follow up. OT will sign off.    Follow Up Recommendations  Supervision/Assistance - 24 hour;Home health OT    Equipment Recommendations  Other (comment) (tbd by ALF)    Recommendations for Other Services       Precautions / Restrictions Precautions Precautions: Fall Restrictions Weight Bearing Restrictions: No      Mobility Bed Mobility Overal bed mobility: Needs Assistance Bed Mobility: Supine to Sit;Sit to Supine     Supine to sit: Mod assist;HOB elevated Sit to supine: Min assist   General bed mobility comments: Verbal and tactile cues to move to EOB.  Patient required mult verbal cues before initiating movement.  Assist to bring LE's off of bed and to raise trunk to sitting.  Assist to scoot to EOB in sitting.  Good sitting balance once upright.  Min assist to bring LE's onto bed to return to supine.  Transfers Overall transfer level: Needs assistance Equipment used: 2 person hand held assist Transfers: Sit to/from Stand Sit to Stand: Min assist;+2 physical assistance         General transfer comment: Verbal and tactile cues to move to standing.  Assist to rise to standing and for balance.  Patient leaning to left and posteriorly.    Balance Overall balance assessment: Needs assistance Sitting-balance support: No upper extremity supported;Feet supported Sitting balance-Leahy Scale: Good     Standing balance support: No upper extremity supported;During functional  activity Standing balance-Leahy Scale: Fair                              ADL Overall ADL's : At baseline                                       General ADL Comments: Patient likely at or close to baseline for ADLs. Total A to don/doff socks. Max A to wash hands at sink -- difficulty figuring out turning faucets on/off, obtaining soap.      Vision     Perception     Praxis      Pertinent Vitals/Pain Pain Assessment: Faces Faces Pain Scale: No hurt     Hand Dominance     Extremity/Trunk Assessment Upper Extremity Assessment Upper Extremity Assessment: Generalized weakness   Lower Extremity Assessment Lower Extremity Assessment: Defer to PT evaluation       Communication Communication Communication: Expressive difficulties (difficult to understand)   Cognition Arousal/Alertness: Awake/alert Behavior During Therapy: Restless Overall Cognitive Status: History of cognitive impairments - at baseline                     General Comments       Exercises       Shoulder Instructions      Home Living Family/patient expects to be discharged to:: Assisted living  Home Equipment: [place] - 4 wheels;Wheelchair - manual          Prior Functioning/Environment Level of Independence: Needs assistance  Gait / Transfers Assistance Needed: Ambulatory with no assistive device ADL's / Homemaking Assistance Needed: Assist all ADL's.  Can feed himself.            OT Problem List: Decreased strength;Decreased range of motion;Decreased activity tolerance;Impaired balance (sitting and/or standing);Decreased safety awareness;Decreased knowledge of use of DME or AE;Cardiopulmonary status limiting activity   OT Treatment/Interventions:      OT Goals(Current goals can be found in the care plan section) Acute Rehab OT Goals Patient Stated Goal: Unable to state OT Goal Formulation: All assessment and  education complete, DC therapy  OT Frequency:     Barriers to D/C:            Co-evaluation PT/OT/SLP Co-Evaluation/Treatment: Yes Reason for Co-Treatment: For patient/therapist safety;Necessary to address cognition/behavior during functional activity PT goals addressed during session: Mobility/safety with mobility OT goals addressed during session: ADL's and self-care      End of Session Equipment Utilized During Treatment: Gait belt;Rolling walker Nurse Communication: Mobility status  Activity Tolerance: Patient tolerated treatment well Patient left: in bed;with call bell/phone within reach;with bed alarm set;with family/visitor present;with restraints reapplied   Time: 1117-1141 OT Time Calculation (min): 24 min Charges:  OT General Charges $OT Visit: 1 Procedure OT Evaluation $OT Eval Low Complexity: 1 Procedure G-Codes:    Dawnette Mione A 2016-01-13, 12:49 PM

## 2015-12-30 NOTE — Consult Note (Signed)
            [facility] CM Primary Care Navigator  12/30/2015  VALERY CIRRITO 02-05-1926 [id number]  Went to see patient at the bedside to identify possible discharge needs but 2 healthcare staff were discussing with a family member and patient in the room.  Will try again at another time when patient is available.  For additional questions please contact:  Edwena Felty A. Citlaly Camplin, BSN, RN-BC [facility] PRIMARY CARE Navigator Cell: [phone]

## 2015-12-31 DIAGNOSIS — Z515 Encounter for palliative care: Secondary | ICD-10-CM

## 2015-12-31 LAB — GLUCOSE, CAPILLARY: GLUCOSE-CAPILLARY: 91 mg/dL (ref 65–99)

## 2015-12-31 LAB — CBC WITH DIFFERENTIAL/PLATELET
BASOS ABS: 0 10*3/uL (ref 0.0–0.1)
BASOS PCT: 1 %
Eosinophils Absolute: 0.5 10*3/uL (ref 0.0–0.7)
Eosinophils Relative: 7 %
HEMATOCRIT: 40.1 % (ref 39.0–52.0)
Hemoglobin: 12.9 g/dL — ABNORMAL LOW (ref 13.0–17.0)
LYMPHS PCT: 27 %
Lymphs Abs: 2.2 10*3/uL (ref 0.7–4.0)
MCH: 30.6 pg (ref 26.0–34.0)
MCHC: 32.2 g/dL (ref 30.0–36.0)
MCV: 95 fL (ref 78.0–100.0)
Monocytes Absolute: 1.1 10*3/uL — ABNORMAL HIGH (ref 0.1–1.0)
Monocytes Relative: 14 %
NEUTROS ABS: 4.1 10*3/uL (ref 1.7–7.7)
Neutrophils Relative %: 51 %
PLATELETS: 199 10*3/uL (ref 150–400)
RBC: 4.22 MIL/uL (ref 4.22–5.81)
RDW: 12.7 % (ref 11.5–15.5)
WBC: 8 10*3/uL (ref 4.0–10.5)

## 2015-12-31 LAB — COMPREHENSIVE METABOLIC PANEL
ALBUMIN: 3.4 g/dL — AB (ref 3.5–5.0)
ALT: 16 U/L — AB (ref 17–63)
ANION GAP: 7 (ref 5–15)
AST: 29 U/L (ref 15–41)
Alkaline Phosphatase: 83 U/L (ref 38–126)
BILIRUBIN TOTAL: 0.8 mg/dL (ref 0.3–1.2)
BUN: 21 mg/dL — ABNORMAL HIGH (ref 6–20)
CHLORIDE: 112 mmol/L — AB (ref 101–111)
CO2: 24 mmol/L (ref 22–32)
Calcium: 9 mg/dL (ref 8.9–10.3)
Creatinine, Ser: 1.35 mg/dL — ABNORMAL HIGH (ref 0.61–1.24)
GFR calc Af Amer: 53 mL/min — ABNORMAL LOW (ref >60)
GFR, EST NON AFRICAN AMERICAN: 46 mL/min — AB (ref >60)
Glucose, Bld: 87 mg/dL (ref 65–99)
POTASSIUM: 4 mmol/L (ref 3.5–5.1)
Sodium: 143 mmol/L (ref 135–145)
TOTAL PROTEIN: 6.6 g/dL (ref 6.5–8.1)

## 2015-12-31 LAB — PHOSPHORUS: Phosphorus: 3 mg/dL (ref 2.5–4.6)

## 2015-12-31 LAB — MAGNESIUM: MAGNESIUM: 1.9 mg/dL (ref 1.7–2.4)

## 2015-12-31 MED ORDER — LEVOTHYROXINE SODIUM 50 MCG PO TABS: 50.0000 ug | ORAL_TABLET | Freq: Every day | ORAL | 0 refills | Status: AC

## 2015-12-31 NOTE — Progress Notes (Signed)
CM called [facility] to arrange for HHPT/OT Secondary school teacher) and rep Kennyth Lose) [phone] states as long as it is in DC instructions (and it is) [facility] will follow.  Plan is for family to reassess with outpt palliative care.  No other CM needs were communicated.

## 2015-12-31 NOTE — Progress Notes (Signed)
Pt prepared for d/c to ALF. IV d/c'd. Skin intact except as charted in most recent assessments. Vitals are stable. Report called to receiving facility. Pt to be transported by family to [facility].

## 2015-12-31 NOTE — NC FL2 (Signed)
[place] LEVEL OF CARE SCREENING TOOL     IDENTIFICATION  Patient Name: Michael Bradley Birthdate: [birth year] Sex: male Admission Date (Current Location): 12/28/2015  [facility] and [place] Number:  Herbalist and Address:  [facility] [place] [place] [address], [place], [place] [address]      Provider Number: [id number]  Attending Physician Name and Address:  Kerney Elbe, DO  Relative Name and Phone Number:  [name] [phone]    Current Level of Care: Hospital Recommended Level of Care: [place], Other (Comment) (with HHPT and palliative) Prior Approval Number:    Date Approved/Denied:   PASRR Number:    Discharge Plan: Other (Comment) (ALF memory care with HHPT and palliative)    Current Diagnoses: Patient Active Problem List   Diagnosis Date Noted  . Advance care planning   . Altered mental status   . Acute encephalopathy 12/28/2015  . CKD (chronic kidney disease), stage III 12/28/2015  . Hypertension 12/28/2015  . GERD (gastroesophageal reflux disease) 12/28/2015  . Depression 12/28/2015  . Chronic diastolic CHF (congestive heart failure) ([place]) 12/28/2015  . Bleeding gastrointestinal   . GI bleed 06/02/2015  . Delirium 06/02/2015  . UTI (lower urinary tract infection) 06/02/2015  . Chronic blood loss anemia 06/02/2015  . Hypothyroid 08/16/2011  . Vertigo 08/16/2011  . Dementia 12/07/2010  . Hypercholesterolemia 12/07/2010  . Benign hypertensive heart disease without heart failure 12/07/2010  . 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 Dyspnea on exertion 12/07/2010    Orientation RESPIRATION BLADDER Height & Weight     Self  O2 (Nasal Canula 2 L) Incontinent Weight: 145 lb 4.7 oz (65.9 kg) Height:  5\' 6"  (167.6 cm)  BEHAVIORAL SYMPTOMS/MOOD NEUROLOGICAL BOWEL NUTRITION STATUS   (None)  (Dementia) Incontinent Diet (Regular)  AMBULATORY STATUS COMMUNICATION OF NEEDS Skin   Limited  Assist Verbally Skin abrasions, Bruising                       Personal Care Assistance Level of Assistance  Bathing, Feeding, Dressing Bathing Assistance: Maximum assistance Feeding assistance: Maximum assistance Dressing Assistance: Maximum assistance     Functional Limitations Info  Sight, Hearing, Speech Sight Info: Adequate Hearing Info: Adequate Speech Info: Adequate    SPECIAL CARE FACTORS FREQUENCY  PT (By licensed PT), OT (By licensed OT), Blood pressure     PT Frequency: 3 x week OT Frequency: 3 x week            Contractures Contractures Info: Not present    Additional Factors Info  Code Status, Allergies, Psychotropic Code Status Info: DNR Allergies Info: Lisinopril, Quinidine, Quinolones Psychotropic Info: Depression         Current Medications (12/31/2015):  This is the current hospital active medication list Current Facility-Administered Medications  Medication Dose Route Frequency Provider Last Rate Last Dose  . 0.9 %  sodium chloride infusion  250 mL Intravenous PRN Vianne Bulls, MD      . acetaminophen (TYLENOL) tablet 650 mg  650 mg Oral Q6H PRN Vianne Bulls, MD       Or  . acetaminophen (TYLENOL) suppository 650 mg  650 mg Rectal Q6H PRN Vianne Bulls, MD      . aspirin EC tablet 81 mg  81 mg Oral Daily Vianne Bulls, MD   81 mg at 12/30/15 0955  . atorvastatin (LIPITOR) tablet 40 mg  40 mg Oral Daily Christia Reading  S Opyd, MD   40 mg at 12/30/15 0955  . bisacodyl (DULCOLAX) EC tablet 5 mg  5 mg Oral Daily PRN Vianne Bulls, MD      . escitalopram (LEXAPRO) tablet 5 mg  5 mg Oral Daily Vianne Bulls, MD   5 mg at 12/30/15 0954  . ferrous sulfate tablet 325 mg  325 mg Oral Q breakfast Vianne Bulls, MD   325 mg at 12/30/15 0956  . heparin injection 5,000 Units  5,000 Units Subcutaneous Q8H Vianne Bulls, MD   5,000 Units at 12/31/15 [id number]  . HYDROcodone-acetaminophen (NORCO/VICODIN) 5-325 MG per tablet 1-2 tablet  1-2 tablet Oral Q4H PRN  Vianne Bulls, MD      . levothyroxine (SYNTHROID, LEVOTHROID) tablet 50 mcg  50 mcg Oral QAC breakfast Vianne Bulls, MD   50 mcg at 12/30/15 0955  . MEDLINE mouth rinse  15 mL Mouth Rinse BID Vianne Bulls, MD   15 mL at 12/30/15 2237  . ondansetron (ZOFRAN) tablet 4 mg  4 mg Oral Q6H PRN Vianne Bulls, MD       Or  . ondansetron (ZOFRAN) injection 4 mg  4 mg Intravenous Q6H PRN Vianne Bulls, MD      . pantoprazole (PROTONIX) EC tablet 40 mg  40 mg Oral Daily Vianne Bulls, MD   40 mg at 12/30/15 0955  . polyethylene glycol (MIRALAX / GLYCOLAX) packet 17 g  17 g Oral Daily PRN Ilene Qua Opyd, MD      . potassium chloride (KLOR-CON) packet 40 mEq  40 mEq Oral BID WC Omair Latif Sheikh, DO   40 mEq at 12/30/15 1727  . sodium chloride flush (NS) 0.9 % injection 3 mL  3 mL Intravenous Q12H Vianne Bulls, MD   3 mL at 12/30/15 1043  . sodium chloride flush (NS) 0.9 % injection 3 mL  3 mL Intravenous PRN Vianne Bulls, MD      . terazosin (HYTRIN) capsule 4 mg  4 mg Oral QHS Vianne Bulls, MD   4 mg at 12/30/15 2237     Discharge Medications: Please see discharge summary for a list of discharge medications.  Relevant Imaging Results:  Relevant Lab Results:   Additional Information SS#: [id number]  Candie Chroman, LCSW

## 2015-12-31 NOTE — Clinical Social Work Note (Signed)
CSW facilitated patient discharge including contacting patient family and facility to confirm patient discharge plans. Clinical information faxed to facility and family agreeable with plan. Family to transport back to [facility]F. RN to call report prior to discharge [phone]).  CSW will sign off for now as social work intervention is no longer needed. Please consult [place] again if new needs arise.  Dayton Scrape, [place]

## 2015-12-31 NOTE — Discharge Summary (Signed)
Physician Discharge Summary  Michael Bradley [id number] DOB: February 15, 1926 DOA: 12/28/2015  PCP: No primary care provider on file.  Admit date: 12/28/2015 Discharge date: 12/31/2015  Admitted From: ALF Disposition:  [place] PT at ALF  Recommendations for Outpatient Follow-up:  1. Follow up with PCP in 1-2 weeks 2. Please obtain BMP/CBC in one week 3. Please Consult [place] as an [place]: YES Equipment/Devices: No devices Recommended by PT  Discharge Condition: Stable and Improved CODE STATUS: DNR Diet recommendation: Heart Healthy  Brief/Interim Summary: Michael Labombard Johnsonis a 82 y.o.malewith medical history significant fordementia, hypertension, hypothyroidism, depression, and chronic diastolic CHF who presents to the emergency department from his SNF for evaluation of increased confusion, agitation, and unsteady gait. Patient is reportedly disoriented at baseline, but typically pleasant and cooperative. He is normally able to walk without assistance at baseline and can hold the basic conversation. He is noted by SNF staff today to be increasingly confused with unintelligible speech, agitation, and unsteady gait. There had been no known fall or trauma and the patient had not voiced any specific complaints. Patient was encephalopathic but imprved and was unable to provide a subjective history today because of his advanced dementia. Spoke with HCPOA his niece yesterday and she would like to talk to Palliative Care. Palliative Care was consulted and it was decided by the family to see a [place] as an Outpatient when patient gets back to the facility.   Discharge Diagnoses:  Principal Problem:   Acute encephalopathy Active Problems:   Dementia   Hypothyroid   CKD (chronic kidney disease), stage III   Hypertension   GERD (gastroesophageal reflux disease)   Depression   Chronic diastolic CHF (congestive heart failure) (HCC)  Altered mental status   Advance care planning  1. Acute Encephalopathy with Gait disturbances superimposed on Advanced Dementia  - Head CT negative and showed No acute finding by CT. Ordinary age related atrophy. Mild chronicsmall-vessel disease of the white matter. - No significant electrolyte derangement, ammonia level and TSH normal  - Urinalysis was clean.  - Discussed Case with Neurology PA Theodosia Paling and he recommended obtaining MRI and EEG; If patient still not improved will warrant a formal Consultation - In review of last records, patients MMSE was 13/30.  - Will Hold Galantamine ER and Memantine XR and restart at D/C - MRI showed No acute intracranial abnormality. Evidence of chronic small vessel disease with mild progression since 2012. Interval cerebral volume loss which appears fairly generalized. - EEG showed consistent with a generalized nonspecific cerebral dysfunction (encephalopathy).There was no seizure or seizure predisposition recorded on this study. Please note that a normal EEG does not preclude the possibility of epilepsy - PT and OT recommended Home Health PT with no Equipment Recommendations - Consulted Palliative Care at request of HCPOA as patient's declining status. Discussed with Family and they would like to consult Palliative Care when they get back to the facility.  2. Hypokalemia, resolved -Patient's K+ was 4.0 this AM -Repleted with po Kcl 40 mEQ x2  -Recheck BMP in 1 week  3. CKD stage III   - Stable. - He was given a 20 mg IV Lasix push in Ed; D/C'd and started low IVF. - Repeat BMP in 1 week.  4. GERD - Stable, normal EGD in March 2017  - Continue daily PPI with Protonix 40 mg   5. Hypertension  - At goal currently  - Monitor and treat prn - D/C'd  Lisinopril because patient's CKD and because he has allergy to it.   6. Depression - Continue Escitalopram 5 mg po Daily  7. Chronic diastolic CHF, currently not in exacerbation - TTE  (06/29/15) with EF 55-60%, mild LVH, severe LAE  - BNP was 154.1 - Follow daily wts and I/O's; Giving Low Dose IVF as he seemed dry yesterday. -Continue to Monitor   8. Hyperlipidemia - Continue Atorvastatin 40 mg po Daily  9. Hypothyroidism - TSH was 3.955 - C/w Levothyroxine 50 mcg   10. Advanced Dementia -Restart Dementia Meds.   Discharge Instructions  Discharge Instructions    Call MD for:  difficulty breathing, headache or visual disturbances    Complete by:  As directed    Call MD for:  extreme fatigue    Complete by:  As directed    Diet - low sodium heart healthy    Complete by:  As directed    Discharge instructions    Complete by:  As directed    Follow up Care at ALF and have Palliative Care Consultation as an outpatient. Take all medications as prescribed. If symptoms return or change please follow up with PCP or return to ER for evaluation   Increase activity slowly    Complete by:  As directed        Medication List    STOP taking these medications   amoxicillin 500 MG capsule Commonly known as:  AMOXIL   lisinopril 20 MG tablet Commonly known as:  PRINIVIL,ZESTRIL     TAKE these medications   acetaminophen 325 MG tablet Commonly known as:  TYLENOL Take 325-650 mg by mouth every 6 (six) hours as needed for mild pain, moderate pain or headache.   acidophilus Caps capsule Take 2 capsules by mouth daily.   aspirin 81 MG tablet Take 81 mg by mouth daily.   atorvastatin 80 MG tablet Commonly known as:  LIPITOR Take 0.5 tablets (40 mg total) by mouth daily.   chlorpheniramine 4 MG tablet Commonly known as:  CHLOR-TRIMETON Take 4 mg by mouth 2 (two) times daily as needed for allergies.   cyanocobalamin 1000 MCG/ML injection Commonly known as:  (VITAMIN B-12) Inject 1 mL (1,000 mcg total) into the muscle every 30 (thirty) days.   escitalopram 5 MG tablet Commonly known as:  LEXAPRO Take 5 mg by mouth daily.   ferrous sulfate 325 (65 FE) MG  tablet Take 1 tablet (325 mg total) by mouth daily with breakfast.   furosemide 20 MG tablet Commonly known as:  LASIX Take 1 tablet (20 mg total) by mouth daily.   galantamine 24 MG 24 hr capsule Commonly known as:  RAZADYNE ER Take 1 capsule (24 mg total) by mouth daily with breakfast.   levothyroxine 50 MCG tablet Commonly known as:  SYNTHROID, LEVOTHROID Take 1 tablet (50 mcg total) by mouth daily before breakfast. Start taking on:  01/01/2016 What changed:  See the new instructions.   NAMENDA XR 21 MG Cp24 Generic drug:  Memantine HCl ER Take 21 mg by mouth daily.   omeprazole 20 MG capsule Commonly known as:  PRILOSEC Take 20 mg by mouth at bedtime.   SYSTANE OP Place 1 drop into both ears daily.   terazosin 2 MG capsule Commonly known as:  HYTRIN Take 4 mg by mouth at bedtime.       Allergies  Allergen Reactions  . Lisinopril Cough  . Quinidine   . Quinolones Other (See Comments)    Pt doesn't  recall    Consultations:  Neurology Phone Conversation.    Procedures/Studies: Dg Chest 2 View  Result Date: 12/28/2015 CLINICAL DATA:  Altered mental status EXAM: CHEST  2 VIEW COMPARISON:  06/02/2015 FINDINGS: Cardiac shadow is enlarged but stable. Elevation right hemidiaphragm is again seen. Chronic changes are identified in both lungs somewhat accentuated by poor inspiratory technique. There may be some mildly superimpose to pulmonary edema present. No sizable effusion is noted. IMPRESSION: Diffuse chronic changes bilaterally. A slight increased density is noted likely related to mild pulmonary edema. Electronically Signed   By: Inez Catalina M.D.   On: 12/28/2015 17:45   Ct Head Wo Contrast  Result Date: 12/28/2015 CLINICAL DATA:  Dementia.  Altered mental status.  Gait disturbance. EXAM: CT HEAD WITHOUT CONTRAST TECHNIQUE: Contiguous axial images were obtained from the base of the skull through the vertex without intravenous contrast. COMPARISON:  MRI  12/16/2010 FINDINGS: Brain: There is mild age related atrophy. There are mild changes of chronic small vessel disease affecting the deep white matter. No sign of cortical or large vessel territory infarction. No mass lesion, hemorrhage, hydrocephalus or extra-axial collection. Ventricular prominence is in proportion to the degree of atrophy. Vascular: There is atherosclerotic calcification of the major vessels at the base of the brain. Skull: No calvarial abnormality. Sinuses/Orbits: Mild mucosal thickening along the floors of the maxillary sinuses. Otherwise clear. Orbits negative. Other: Negative IMPRESSION: No acute finding by CT. Ordinary age related atrophy. Mild chronic small-vessel disease of the white matter. Electronically Signed   By: Nelson Chimes M.D.   On: 12/28/2015 16:37   Mr Brain Wo Contrast  Result Date: 12/29/2015 CLINICAL DATA:  82 year old male with altered mental status. Increased confusion and agitation. Initial encounter. EXAM: MRI HEAD WITHOUT CONTRAST TECHNIQUE: Multiplanar, multiecho pulse sequences of the brain and surrounding structures were obtained without intravenous contrast. COMPARISON:  Head CT without contrast 12/28/2015. Brain MRI 12/16/2010. FINDINGS: Brain: No restricted diffusion to suggest acute infarction. No midline shift, mass effect, evidence of mass lesion, ventriculomegaly, extra-axial collection or acute intracranial hemorrhage. Cervicomedullary junction and pituitary are within normal limits. Cerebral volume loss since 2012, appears fairly generalized. Chronic increased T2 signal in the deep gray matter nuclei appears primarily due to perivascular spaces. Progressed and mildly confluent cerebral white matter T2 and FLAIR hyperintensity which is mostly periventricular. There is some deep white matter capsule involvement. No cortical encephalomalacia. No chronic cerebral blood products identified. Negative brainstem and cerebellum. Vascular: Major intracranial  vascular flow voids appear stable with a mild to moderate degree of generalized intracranial artery dolichoectasia. Skull and upper cervical spine: Negative. Normal bone marrow signal. Sinuses/Orbits: Chronic maxillary sinus mucous retention cyst. Other paranasal sinuses remain clear. Orbits soft tissues appear stable. Negative scalp soft tissues. Other: Visible internal auditory structures appear normal. Mastoids are clear. IMPRESSION: 1.  No acute intracranial abnormality. 2. Evidence of chronic small vessel disease with mild progression since 2012. Interval cerebral volume loss which appears fairly generalized. Electronically Signed   By: Genevie Ann M.D.   On: 12/29/2015 19:36    EEG This EEG is consistent with a generalized nonspecific cerebral dysfunction (encephalopathy). There was no seizure or seizure predisposition recorded on this study. Please note that a normal EEG does not preclude the possibility of epilepsy  Subjective: Patient was seen and examined this AM and was doing well. More Conversive but severely demented. No acute complaints from family at bedside. HHPT recommended.   Discharge Exam: Vitals:   12/31/15 0454 12/31/15  1342  BP: (!) 143/81 133/70  Pulse: (!) 58 (!) 59  Resp: 18 (!) 21  Temp: 98.4 F (36.9 C) 98.4 F (36.9 C)   Vitals:   12/30/15 2040 12/30/15 2134 12/31/15 0454 12/31/15 1342  BP:  135/76 (!) 143/81 133/70  Pulse:  (!) 55 (!) 58 (!) 59  Resp:  16 18 (!) 21  Temp:  98.2 F (36.8 C) 98.4 F (36.9 C) 98.4 F (36.9 C)  TempSrc:  Oral Oral Oral  SpO2: 94% 93% 92% 100%  Weight:   65.9 kg (145 lb 4.7 oz)   Height:        General: Pt is alert, awake, not in acute distress Cardiovascular: RRR, S1/S2 +, no rubs, no gallops Respiratory: CTA bilaterally, no wheezing, no rhonchi Abdominal: Soft, NT, ND, bowel sounds + Extremities: no edema, no cyanosis  The results of significant diagnostics from this hospitalization (including imaging, microbiology,  ancillary and laboratory) are listed below for reference.     Microbiology: Recent Results (from the past 240 hour(s))  MRSA PCR Screening     Status: None   Collection Time: 12/28/15 11:31 PM  Result Value Ref Range Status   MRSA by PCR NEGATIVE NEGATIVE Final    Comment:        The GeneXpert MRSA Assay (FDA approved for NASAL specimens only), is one component of a comprehensive MRSA colonization surveillance program. It is not intended to diagnose MRSA infection nor to guide or monitor treatment for MRSA infections.   Culture, Urine     Status: Abnormal   Collection Time: 12/29/15  1:06 AM  Result Value Ref Range Status   Specimen Description URINE, RANDOM  Final   Special Requests NONE  Final   Culture MULTIPLE SPECIES PRESENT, SUGGEST RECOLLECTION (A)  Final   Report Status 12/30/2015 FINAL  Final     Labs: BNP (last 3 results)  Recent Labs  06/02/15 1730 12/28/15 1930  BNP 158.5* [id number]*   Basic Metabolic Panel:  Recent Labs Lab 12/28/15 1545 12/29/15 0502 12/30/15 0529 12/31/15 0605  NA 140 140 142 143  K 3.5 3.4* 3.2* 4.0  CL 107 105 104 112*  CO2 25 25 28 24   GLUCOSE 100* 80 100* 87  BUN 14 14 19  21*  CREATININE 1.27* 1.25* 1.76* 1.35*  CALCIUM 9.3 9.4 9.3 9.0  MG  --   --  2.2 1.9  PHOS  --   --  4.8* 3.0   Liver Function Tests:  Recent Labs Lab 12/28/15 1545 12/30/15 0529 12/31/15 0605  AST 26 28 29   ALT 18 18 16*  ALKPHOS 98 93 83  BILITOT 0.5 0.6 0.8  PROT 7.2 7.1 6.6  ALBUMIN 3.8 3.8 3.4*    Recent Labs Lab 12/28/15 1545  LIPASE 36    Recent Labs Lab 12/28/15 1545  AMMONIA 18   CBC:  Recent Labs Lab 12/28/15 1545 12/30/15 0529 12/31/15 0605  WBC 8.5 7.7 8.0  NEUTROABS 4.8 3.4 4.1  HGB 13.1 13.3 12.9*  HCT 38.6* 40.5 40.1  MCV 92.6 95.1 95.0  PLT 217 219 199   Cardiac Enzymes: No results for input(s): CKTOTAL, CKMB, CKMBINDEX, TROPONINI in the last 168 hours. BNP: Invalid input(s): POCBNP CBG:  Recent  Labs Lab 12/28/15 1654 12/29/15 0752 12/30/15 0804 12/31/15 0759  GLUCAP 99 81 91 91   D-Dimer No results for input(s): DDIMER in the last 72 hours. Hgb A1c No results for input(s): HGBA1C in the last 72 hours. Lipid Profile  No results for input(s): CHOL, HDL, LDLCALC, TRIG, CHOLHDL, LDLDIRECT in the last 72 hours. Thyroid function studies  Recent Labs  12/28/15 1545  TSH 3.955   Anemia work up No results for input(s): VITAMINB12, FOLATE, FERRITIN, TIBC, IRON, RETICCTPCT in the last 72 hours. Urinalysis    Component Value Date/Time   COLORURINE YELLOW 12/29/2015 0105   APPEARANCEUR CLEAR 12/29/2015 0105   LABSPEC 1.011 12/29/2015 0105   PHURINE 6.5 12/29/2015 0105   GLUCOSEU NEGATIVE 12/29/2015 0105   HGBUR NEGATIVE 12/29/2015 0105   BILIRUBINUR NEGATIVE 12/29/2015 0105   KETONESUR NEGATIVE 12/29/2015 0105   PROTEINUR NEGATIVE 12/29/2015 0105   NITRITE NEGATIVE 12/29/2015 0105   LEUKOCYTESUR NEGATIVE 12/29/2015 0105   Sepsis Labs Invalid input(s): PROCALCITONIN,  WBC,  LACTICIDVEN Microbiology Recent Results (from the past 240 hour(s))  MRSA PCR Screening     Status: None   Collection Time: 12/28/15 11:31 PM  Result Value Ref Range Status   MRSA by PCR NEGATIVE NEGATIVE Final    Comment:        The GeneXpert MRSA Assay (FDA approved for NASAL specimens only), is one component of a comprehensive MRSA colonization surveillance program. It is not intended to diagnose MRSA infection nor to guide or monitor treatment for MRSA infections.   Culture, Urine     Status: Abnormal   Collection Time: 12/29/15  1:06 AM  Result Value Ref Range Status   Specimen Description URINE, RANDOM  Final   Special Requests NONE  Final   Culture MULTIPLE SPECIES PRESENT, SUGGEST RECOLLECTION (A)  Final   Report Status 12/30/2015 FINAL  Final    Time coordinating discharge: Over 30 minutes  SIGNED:  Kerney Elbe, DO [facility] 12/31/2015, 1:59 PM Pager  [phone]  If 7PM-7AM, please contact night-coverage [email or web address] Password [email or web address]

## 2015-12-31 NOTE — Progress Notes (Signed)
Daily Progress Note   Patient Name: Michael Bradley       Date: 12/31/2015 DOB: [birth year]  Age: 82 y.o. MRN#: [id number] Attending Physician: Kerney Elbe, DO Primary Care Physician: No primary care provider on file. Admit Date: 12/28/2015  Reason for Consultation/Follow-up: Establishing goals of care  Subjective: Patient remains confused but denies complaints this morning.  I met with patient and his nephew, [name].  Also talked with [name] (niece/HCPOA) over the phone.  We discussed his clinical course to this point in time as well as the unknown of how he will continue to do over the next several days to weeks.  Discussed that the hospital can be useful as long as he is getting well enough from care he receives at the hospital to enjoy his time outside the hospital, but there is going to come a time in the future where, if his goal is to be out of the hospital, he may be better served to plan on being at home and bringing care to him at home rather repeated trips to the hospital. We discussed hospice as a tool that may be beneficial in this goal when he reaches a point where we are trying to fix problems that are not fixable.  [name] is in agreement that a good plan would be to plan to go back to memory care as they have previously been arranging with outpatient palliative care follow-up at facility if it can be arranged. If he does well and continues to thrive, I encouraged they continue with this plan. If, however he is unable to regain function and he continues to decline, I recommended that she speak with his care team to determine if he may be better served by focusing his care on comfort with support of organization such as hospice.  Length of Stay: 3  Current  Medications: Scheduled Meds:  . aspirin EC  81 mg Oral Daily  . atorvastatin  40 mg Oral Daily  . escitalopram  5 mg Oral Daily  . ferrous sulfate  325 mg Oral Q breakfast  . heparin  5,000 Units Subcutaneous Q8H  . levothyroxine  50 mcg Oral QAC breakfast  . mouth rinse  15 mL Mouth Rinse BID  . pantoprazole  40 mg Oral Daily  . sodium chloride flush  3 mL  Intravenous Q12H  . terazosin  4 mg Oral QHS    Continuous Infusions:    PRN Meds: sodium chloride, acetaminophen **OR** acetaminophen, bisacodyl, HYDROcodone-acetaminophen, ondansetron **OR** ondansetron (ZOFRAN) IV, polyethylene glycol, sodium chloride flush  Physical Exam         Constitutional: Elderly male lying in bed. Constant picking at pulse-ox. No distress.  Cardiovascular: Normal rate and regular rhythm.   Pulmonary/Chest: Effort normal and breath sounds normal.  Musculoskeletal: Normal range of motion.  Neurological:  Pleasantly confused, limited verbal responses  Skin: Skin is warm and dry.   Vital Signs: BP (!) 143/81 (BP Location: Left Arm)   Pulse (!) 58   Temp 98.4 F (36.9 C) (Oral)   Resp 18   Ht _0  (1.676 m)   Wt 65.9 kg (145 lb 4.7 oz)   SpO2 92%   BMI 23.45 kg/m  SpO2: SpO2: 92 % O2 Device: O2 Device: Nasal Cannula O2 Flow Rate: O2 Flow Rate (L/min): 2 L/min  Intake/output summary:  Intake/Output Summary (Last 24 hours) at 12/31/15 1123 Last data filed at 12/31/15 2595  Gross per 24 hour  Intake          1538.75 ml  Output                0 ml  Net          1538.75 ml   LBM: Last BM Date: 12/29/15 Baseline Weight: Weight: 68 kg (150 lb) Most recent weight: Weight: 65.9 kg (145 lb 4.7 oz)       Palliative Assessment/Data: 50%   Flowsheet Rows   Flowsheet Row Most Recent Value  Intake Tab  Referral Department  Hospitalist  Unit at Time of Referral  Med/Surg Unit  Palliative Care Primary Diagnosis  Cardiac  Date Notified  12/30/15  Palliative Care Type  New Palliative care   Reason for referral  Clarify Goals of Care  Date of Admission  12/28/15  # of days IP prior to Palliative referral  2  Clinical Assessment  Psychosocial & Spiritual Assessment  Palliative Care Outcomes      Patient Active Problem List   Diagnosis Date Noted  . Advance care planning   . Altered mental status   . Acute encephalopathy 12/28/2015  . CKD (chronic kidney disease), stage III 12/28/2015  . Hypertension 12/28/2015  . GERD (gastroesophageal reflux disease) 12/28/2015  . Depression 12/28/2015  . Chronic diastolic CHF (congestive heart failure) ([place]) 12/28/2015  . Bleeding gastrointestinal   . GI bleed 06/02/2015  . Delirium 06/02/2015  . UTI (lower urinary tract infection) 06/02/2015  . Chronic blood loss anemia 06/02/2015  . Hypothyroid 08/16/2011  . Vertigo 08/16/2011  . Dementia 12/07/2010  . Hypercholesterolemia 12/07/2010  . Benign hypertensive heart disease without heart failure 12/07/2010  . 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 Dyspnea on exertion 12/07/2010    Palliative Care Assessment & Plan   Patient Profile: 82 y.o. male  with past medical history of advanced dementia, depression, hypothyroidism, chronic diastolic CHF admitted on 63/87/5643 with increasing agitation and gait disturbance.  Workup thus far has been negative for any acute abnormalities as etiology of mental status changes. Palliative medicine consulted per family request for [place] discussion.  Recommendations/Plan:  Met today with patient nephew, [name], and discussed with his niece/HCPOA, [name], via phone.  Patient "closer to himself" this AM per [name].  He remains confused and does not really participate in conversation.  Family reports that plan is to discharge back  to memory unit where he has been living.  Completed durable DNR and placed on chart.  Discussed OP palliative f/u at facility.  If he continues to decline, I recommended consideration of enacting his hospice benefit to  assist with care as he approaches end of life.  Recommend palliative care follow-up on discharge.  Please include need for palliative care outpatient follow-up on discharge summary if you agree this is appropriate.    Code Status:    Code Status Orders        Start     Ordered   12/28/15 2107  Do not attempt resuscitation (DNR)  Continuous    Question Answer Comment  In the event of cardiac or respiratory ARREST Do not call a "code blue"   In the event of cardiac or respiratory ARREST Do not perform Intubation, CPR, defibrillation or ACLS   In the event of cardiac or respiratory ARREST Use medication by any route, position, wound care, and other measures to relive pain and suffering. May use oxygen, suction and manual treatment of airway obstruction as needed for comfort.      12/28/15 2109    Code Status History    Date Active Date Inactive Code Status Order ID Comments User Context   06/02/2015 10:49 PM 06/05/2015  5:43 PM DNR [id number]  Edwin Dada, MD Inpatient   06/02/2015 10:49 PM 06/02/2015 10:49 PM Full Code [id number]  Edwin Dada, MD Inpatient    Advance Directive Documentation   Flowsheet Row Most Recent Value  Type of Advance Directive  Healthcare Power of [facility](neice)]  Pre-existing out of facility DNR order (yellow form or pink MOST form)  No data  "MOST" Form in Place?  No data       Prognosis:   < 12 months  Discharge Planning:  Memory care unit with palliaitve services following  Care plan was discussed with patient, family  Thank you for allowing the Palliative Medicine Team to assist in the care of this patient.   Time In: 1020 Time Out: 1100 Total Time 40 Prolonged Time Billed No      Greater than 50%  of this time was spent counseling and coordinating care related to the above assessment and plan.  Micheline Rough, MD  Please contact Palliative Medicine Team phone at [phone] for questions and concerns.

## 2016-01-02 ENCOUNTER — Telehealth: Payer: Self-pay | Admitting: Critical Care Medicine

## 2016-01-02 DIAGNOSIS — R2681 Unsteadiness on feet: Secondary | ICD-10-CM | POA: Diagnosis not present

## 2016-01-02 DIAGNOSIS — R488 Other symbolic dysfunctions: Secondary | ICD-10-CM | POA: Diagnosis not present

## 2016-01-02 DIAGNOSIS — M6281 Muscle weakness (generalized): Secondary | ICD-10-CM | POA: Diagnosis not present

## 2016-01-02 NOTE — Consult Note (Signed)
               [facility] CM Primary Care Navigator  01/02/2016  JOVI ASIS 1926/01/02 [id number]   Attempt to follow up with patient since unable to get hold of patient of family last Friday, but patient was discharged 12/31/15. Called PCP office (spoke to [place]) and notified of patient's hospital discharge and future need to follow-up for transition of care.  For additional questions please contact:  Edwena Felty A. Julius Boniface, BSN, RN-BC [facility] PRIMARY CARE Navigator Cell: [phone]

## 2016-01-02 NOTE — Telephone Encounter (Addendum)
Spoke with Michael Bradley - pt has left the hospital and gone back to [employer]. Requests that we contact the patient to schedule her for a follow up visit with one of our MD's. Aware that the patient has not been seen in several years and will be a new patient to our office. Aware that we will contact the patient to schedule the appt.   Michael Bradley (neice) answered the phone and is scheduling the visit as requested.  [name] had many scheduling problems so the 1st available appt to be scheduled was in November.  Pt is scheduled to see Dr Melvyn Novas on 01/23/16 at 9:45 -- aware to arrive at 9:30 to fill out paperwork.  Nothing further needed.

## 2016-01-03 DIAGNOSIS — R2681 Unsteadiness on feet: Secondary | ICD-10-CM | POA: Diagnosis not present

## 2016-01-03 DIAGNOSIS — R488 Other symbolic dysfunctions: Secondary | ICD-10-CM | POA: Diagnosis not present

## 2016-01-03 DIAGNOSIS — M6281 Muscle weakness (generalized): Secondary | ICD-10-CM | POA: Diagnosis not present

## 2016-01-04 DIAGNOSIS — R2681 Unsteadiness on feet: Secondary | ICD-10-CM | POA: Diagnosis not present

## 2016-01-04 DIAGNOSIS — M6281 Muscle weakness (generalized): Secondary | ICD-10-CM | POA: Diagnosis not present

## 2016-01-04 DIAGNOSIS — I10 Essential (primary) hypertension: Secondary | ICD-10-CM | POA: Diagnosis not present

## 2016-01-04 DIAGNOSIS — G9340 Encephalopathy, unspecified: Secondary | ICD-10-CM | POA: Diagnosis not present

## 2016-01-04 DIAGNOSIS — E039 Hypothyroidism, unspecified: Secondary | ICD-10-CM | POA: Diagnosis not present

## 2016-01-04 DIAGNOSIS — R488 Other symbolic dysfunctions: Secondary | ICD-10-CM | POA: Diagnosis not present

## 2016-01-04 DIAGNOSIS — R627 Adult failure to thrive: Secondary | ICD-10-CM | POA: Diagnosis not present

## 2016-01-05 DIAGNOSIS — R2681 Unsteadiness on feet: Secondary | ICD-10-CM | POA: Diagnosis not present

## 2016-01-05 DIAGNOSIS — M6281 Muscle weakness (generalized): Secondary | ICD-10-CM | POA: Diagnosis not present

## 2016-01-05 DIAGNOSIS — R488 Other symbolic dysfunctions: Secondary | ICD-10-CM | POA: Diagnosis not present

## 2016-01-09 DIAGNOSIS — M6281 Muscle weakness (generalized): Secondary | ICD-10-CM | POA: Diagnosis not present

## 2016-01-09 DIAGNOSIS — R488 Other symbolic dysfunctions: Secondary | ICD-10-CM | POA: Diagnosis not present

## 2016-01-09 DIAGNOSIS — R2681 Unsteadiness on feet: Secondary | ICD-10-CM | POA: Diagnosis not present

## 2016-01-10 DIAGNOSIS — R2681 Unsteadiness on feet: Secondary | ICD-10-CM | POA: Diagnosis not present

## 2016-01-10 DIAGNOSIS — R488 Other symbolic dysfunctions: Secondary | ICD-10-CM | POA: Diagnosis not present

## 2016-01-10 DIAGNOSIS — F3341 Major depressive disorder, recurrent, in partial remission: Secondary | ICD-10-CM | POA: Diagnosis not present

## 2016-01-10 DIAGNOSIS — F0150 Vascular dementia without behavioral disturbance: Secondary | ICD-10-CM | POA: Diagnosis not present

## 2016-01-10 DIAGNOSIS — M6281 Muscle weakness (generalized): Secondary | ICD-10-CM | POA: Diagnosis not present

## 2016-01-11 DIAGNOSIS — R2681 Unsteadiness on feet: Secondary | ICD-10-CM | POA: Diagnosis not present

## 2016-01-11 DIAGNOSIS — R488 Other symbolic dysfunctions: Secondary | ICD-10-CM | POA: Diagnosis not present

## 2016-01-11 DIAGNOSIS — M6281 Muscle weakness (generalized): Secondary | ICD-10-CM | POA: Diagnosis not present

## 2016-01-12 DIAGNOSIS — R2681 Unsteadiness on feet: Secondary | ICD-10-CM | POA: Diagnosis not present

## 2016-01-12 DIAGNOSIS — R488 Other symbolic dysfunctions: Secondary | ICD-10-CM | POA: Diagnosis not present

## 2016-01-12 DIAGNOSIS — M6281 Muscle weakness (generalized): Secondary | ICD-10-CM | POA: Diagnosis not present

## 2016-01-13 DIAGNOSIS — M6281 Muscle weakness (generalized): Secondary | ICD-10-CM | POA: Diagnosis not present

## 2016-01-13 DIAGNOSIS — D649 Anemia, unspecified: Secondary | ICD-10-CM | POA: Diagnosis not present

## 2016-01-13 DIAGNOSIS — I519 Heart disease, unspecified: Secondary | ICD-10-CM | POA: Diagnosis not present

## 2016-01-13 DIAGNOSIS — E039 Hypothyroidism, unspecified: Secondary | ICD-10-CM | POA: Diagnosis not present

## 2016-01-13 DIAGNOSIS — K219 Gastro-esophageal reflux disease without esophagitis: Secondary | ICD-10-CM | POA: Diagnosis not present

## 2016-01-13 DIAGNOSIS — R488 Other symbolic dysfunctions: Secondary | ICD-10-CM | POA: Diagnosis not present

## 2016-01-13 DIAGNOSIS — R2681 Unsteadiness on feet: Secondary | ICD-10-CM | POA: Diagnosis not present

## 2016-01-13 DIAGNOSIS — H04123 Dry eye syndrome of bilateral lacrimal glands: Secondary | ICD-10-CM | POA: Diagnosis not present

## 2016-01-13 DIAGNOSIS — E785 Hyperlipidemia, unspecified: Secondary | ICD-10-CM | POA: Diagnosis not present

## 2016-01-13 DIAGNOSIS — I10 Essential (primary) hypertension: Secondary | ICD-10-CM | POA: Diagnosis not present

## 2016-01-16 DIAGNOSIS — F3341 Major depressive disorder, recurrent, in partial remission: Secondary | ICD-10-CM | POA: Diagnosis not present

## 2016-01-16 DIAGNOSIS — D649 Anemia, unspecified: Secondary | ICD-10-CM | POA: Diagnosis not present

## 2016-01-16 DIAGNOSIS — E785 Hyperlipidemia, unspecified: Secondary | ICD-10-CM | POA: Diagnosis not present

## 2016-01-16 DIAGNOSIS — F0150 Vascular dementia without behavioral disturbance: Secondary | ICD-10-CM | POA: Diagnosis not present

## 2016-01-16 DIAGNOSIS — H04123 Dry eye syndrome of bilateral lacrimal glands: Secondary | ICD-10-CM | POA: Diagnosis not present

## 2016-01-16 DIAGNOSIS — I519 Heart disease, unspecified: Secondary | ICD-10-CM | POA: Diagnosis not present

## 2016-01-16 DIAGNOSIS — K219 Gastro-esophageal reflux disease without esophagitis: Secondary | ICD-10-CM | POA: Diagnosis not present

## 2016-01-17 DIAGNOSIS — M6281 Muscle weakness (generalized): Secondary | ICD-10-CM | POA: Diagnosis not present

## 2016-01-17 DIAGNOSIS — R2681 Unsteadiness on feet: Secondary | ICD-10-CM | POA: Diagnosis not present

## 2016-01-17 DIAGNOSIS — R488 Other symbolic dysfunctions: Secondary | ICD-10-CM | POA: Diagnosis not present

## 2016-01-18 DIAGNOSIS — I519 Heart disease, unspecified: Secondary | ICD-10-CM | POA: Diagnosis not present

## 2016-01-18 DIAGNOSIS — K219 Gastro-esophageal reflux disease without esophagitis: Secondary | ICD-10-CM | POA: Diagnosis not present

## 2016-01-18 DIAGNOSIS — E785 Hyperlipidemia, unspecified: Secondary | ICD-10-CM | POA: Diagnosis not present

## 2016-01-18 DIAGNOSIS — E039 Hypothyroidism, unspecified: Secondary | ICD-10-CM | POA: Diagnosis not present

## 2016-01-18 DIAGNOSIS — G9340 Encephalopathy, unspecified: Secondary | ICD-10-CM | POA: Diagnosis not present

## 2016-01-18 DIAGNOSIS — D649 Anemia, unspecified: Secondary | ICD-10-CM | POA: Diagnosis not present

## 2016-01-18 DIAGNOSIS — I10 Essential (primary) hypertension: Secondary | ICD-10-CM | POA: Diagnosis not present

## 2016-01-18 DIAGNOSIS — R296 Repeated falls: Secondary | ICD-10-CM | POA: Diagnosis not present

## 2016-01-19 DIAGNOSIS — M6281 Muscle weakness (generalized): Secondary | ICD-10-CM | POA: Diagnosis not present

## 2016-01-19 DIAGNOSIS — R488 Other symbolic dysfunctions: Secondary | ICD-10-CM | POA: Diagnosis not present

## 2016-01-19 DIAGNOSIS — R2681 Unsteadiness on feet: Secondary | ICD-10-CM | POA: Diagnosis not present

## 2016-01-23 ENCOUNTER — Encounter: Payer: Self-pay | Admitting: Internal Medicine

## 2016-01-23 ENCOUNTER — Ambulatory Visit (INDEPENDENT_AMBULATORY_CARE_PROVIDER_SITE_OTHER): Payer: Medicare Other | Admitting: Internal Medicine

## 2016-01-23 VITALS — BP 126/64 | HR 60

## 2016-01-23 DIAGNOSIS — R627 Adult failure to thrive: Secondary | ICD-10-CM | POA: Diagnosis not present

## 2016-01-23 DIAGNOSIS — R0602 Shortness of breath: Secondary | ICD-10-CM | POA: Diagnosis not present

## 2016-01-23 NOTE — Patient Instructions (Signed)
Progressive cognitive decline without apparent pulmonary complications to date  Decision for palliative care is appropriate as is continued diet as tolerated as decision has been made not to pursue tube feeding  Pulmonary follow up is as needed

## 2016-01-23 NOTE — Progress Notes (Signed)
Subjective:     Patient ID: NGAWANG SCHOOF, male   DOB: [birth year],   MRN: [id number]  HPI    Admit date: 12/28/2015 Discharge date: 12/31/2015  Admitted From: ALF Disposition:  [facility] PT at ALF  Recommendations for Outpatient Follow-up:  1. Follow up with PCP in 1-2 weeks 2. Please obtain BMP/CBC in one week 3. Please Consult [place] as an [place]: YES Equipment/Devices: No devices Recommended by PT  Discharge Condition: Stable and Improved CODE STATUS: DNR Diet recommendation: Heart Healthy  Brief/Interim Summary: Jahmarion Bartusek Johnsonis a 82 y.o.malewith medical history significant fordementia, hypertension, hypothyroidism, depression, and chronic diastolic CHF who presents to the emergency department from his SNF for evaluation of increased confusion, agitation, and unsteady gait. Patient is reportedly disoriented at baseline, but typically pleasant and cooperative. He is normally able to walk without assistance at baseline and can hold the basic conversation. He is noted by SNF staff today to be increasingly confused with unintelligible speech, agitation, and unsteady gait. There had been no known fall or trauma and the patient had not voiced any specific complaints. Patient was encephalopathic but imprved and was unable to provide a subjective history today because of his advanced dementia. Spoke with HCPOA his niece yesterday and she would like to talk to Palliative Care. Palliative Care was consulted and it was decided by the family to see a [facility] as an Outpatient when patient gets back to the facility.   Discharge Diagnoses:  Principal Problem:   Acute encephalopathy Active Problems:   Dementia   Hypothyroid   CKD (chronic kidney disease), stage III   Hypertension   GERD (gastroesophageal reflux disease)   Depression   Chronic diastolic CHF (congestive heart failure) (HCC)   Altered mental status   Advance  care planning  1. Acute Encephalopathy with Gait disturbances superimposed on Advanced Dementia  - Head CT negative and showed No acute finding by CT. Ordinary age related atrophy. Mild chronicsmall-vessel disease of the white matter. - No significant electrolyte derangement, ammonia level and TSH normal  - Urinalysis was clean.  - Discussed Case with Neurology PA Theodosia Paling and he recommended obtaining MRI and EEG; If patient still not improved will warrant a formal Consultation - In review of last records, patients MMSE was 13/30.  - Will Hold Galantamine ER and Memantine XR and restart at D/C - MRI showed No acute intracranial abnormality. Evidence of chronic small vessel disease with mild progression since 2012. Interval cerebral volume loss which appears fairly generalized. - EEG showed consistent with a generalized nonspecific cerebral dysfunction (encephalopathy).There was no seizure or seizure predisposition recorded on this study. Please note that a normal EEG does not preclude the possibility of epilepsy - PT and OT recommended Home Health PT with no Equipment Recommendations - Consulted Palliative Care at request of HCPOA as patient's declining status. Discussed with Family and they would like to consult Palliative Care when they get back to the facility.  2. Hypokalemia, resolved -Patient's K+ was 4.0 this AM -Repleted with po Kcl 40 mEQ x2  -Recheck BMP in 1 week  3. CKD stage III   - Stable. - He was given a 20 mg IV Lasix push in Ed; D/C'd and started low IVF. - Repeat BMP in 1 week.  4. GERD - Stable, normal EGD in March 2017  - Continue daily PPI with Protonix 40 mg   5. Hypertension  - At goal currently  - Monitor  and treat prn - D/C'd Lisinopril because patient's CKD and because he has allergy to it.   6. Depression - Continue Escitalopram 5 mg po Daily  7. Chronic diastolic CHF, currently not in exacerbation - TTE (06/29/15) with EF 55-60%, mild LVH,  severe LAE  - BNP was 154.1 - Follow daily wts and I/O's; Giving Low Dose IVF as he seemed dry yesterday. -Continue to Monitor   8. Hyperlipidemia - Continue Atorvastatin 40 mg po Daily  9. Hypothyroidism - TSH was 3.955 - C/w Levothyroxine 50 mcg   10. Advanced Dementia -Restart Dementia Meds.   Discharge Instructions      Discharge Instructions    Call MD for:  difficulty breathing, headache or visual disturbances    Complete by:  As directed   Call MD for:  extreme fatigue    Complete by:  As directed   Diet - low sodium heart healthy    Complete by:  As directed   Discharge instructions    Complete by:  As directed   Follow up Care at ALF and have Palliative Care Consultation as an outpatient. Take all medications as prescribed. If symptoms return or change please follow up with PCP or return to ER for evaluation   Increase activity slowly    Complete by:  As directed            01/24/2016 1st [facility] office visit/ Melyssa Signor   Chief Complaint  Patient presents with  . Pulmonary Consult    Referred by [facility].   pt now in ALF/ memory unit/ no plans for more aggressive rx/ no feeding tubes/dnr status   Denies cp,sob/ cough/ no reported   day to day or daytime variability or assoc excess/ purulent sputum or mucus plugs or hemoptysis or cp or chest tightness, subjective wheeze or overt sinus or hb symptoms. No unusual exp hx or h/o childhood pna/ asthma or knowledge of premature birth.  Sleeping ok without nocturnal  or early am exacerbation  of respiratory  c/o's or need for noct saba. Also denies any obvious fluctuation of symptoms with weather or environmental changes or other aggravating or alleviating factors except as outlined above   Current Medications, Allergies, Complete Past Medical History, Past Surgical History, Family History, and Social History were reviewed in [employer] record.  ROS  The following are not  active complaints unless bolded sore throat, dysphagia, dental problems, itching, sneezing,  nasal congestion or excess/ purulent secretions, ear ache,   fever, chills, sweats, unintended wt loss, classically pleuritic or exertional cp,  orthopnea pnd or leg swelling, presyncope, palpitations, abdominal pain, anorexia, nausea, vomiting, diarrhea  or change in bowel or bladder habits, change in stools or urine, dysuria,hematuria,  rash, arthralgias, visual complaints, headache, numbness, weakness or ataxia or problems with walking or coordination,  change in mood/affect or memory.            Review of Systems     Objective:   Physical Exam Wt Readings from Last 3 Encounters:  12/31/15 145 lb 4.7 oz (65.9 kg)  06/14/15 156 lb 9.6 oz (71 kg)  06/02/15 165 lb (74.8 kg)    Vital signs reviewed - Note on arrival 02 sats  95% on RA       W/c bound elderly passive wm who recognizes his niece but can't recall her name  HEENT: nl dentition, turbinates, and oropharynx. Nl external ear canals without cough reflex   NECK :  without JVD/Nodes/TM/ nl  carotid upstrokes bilaterally   LUNGS: no acc muscle use,  Nl contour chest which is clear to A and P bilaterally without cough on insp or exp maneuvers   CV:  RRR  no s3 or murmur or increase in P2, no edema   ABD:  soft and nontender with nl inspiratory excursion in the supine position. No bruits or organomegaly, bowel sounds nl  MS:  Nl gait/ ext warm without deformities, calf tenderness, cyanosis or clubbing No obvious joint restrictions   SKIN: warm and dry without lesions    NEURO:  Alert,    no apparent motor deficits/ can barely stick out his tongue to request (repeated instructions x 3)        I personally reviewed images and agree with radiology impression as follows:  CXR:   12/28/15  Diffuse chronic changes bilaterally. A slight increased density is noted likely related to mild pulmonary edema. Assessment:

## 2016-01-24 ENCOUNTER — Encounter: Payer: Self-pay | Admitting: Internal Medicine

## 2016-01-24 NOTE — Assessment & Plan Note (Signed)
Resolved for now and clearly in retrospect related to diastolic dysfunction though high risk asp and sepsis/ALI in this pt approaching endstage dementia.   Discussed in detail all the  indications, usual  risks and alternatives  relative to the benefits with patient's niece who agrees to proceed with conservative f/u as outlined  To end of life care and he remains DNR status/ hospice approp   Total time devoted to counseling  = 25/65m review case with pt/niece including extensive hosp records and  discussion of options/alternatives/ personally creating written instructions  in presence of pt  then going over those specific  Instructions directly with the pt including how to use all of the meds but in particular covering each new medication in detail and the difference between the maintenance/automatic meds and the prns using an action plan format for the latter.

## 2016-02-07 DIAGNOSIS — F3341 Major depressive disorder, recurrent, in partial remission: Secondary | ICD-10-CM | POA: Diagnosis not present

## 2016-02-07 DIAGNOSIS — F0150 Vascular dementia without behavioral disturbance: Secondary | ICD-10-CM | POA: Diagnosis not present

## 2016-02-15 DIAGNOSIS — H04123 Dry eye syndrome of bilateral lacrimal glands: Secondary | ICD-10-CM | POA: Diagnosis not present

## 2016-02-15 DIAGNOSIS — F0150 Vascular dementia without behavioral disturbance: Secondary | ICD-10-CM | POA: Diagnosis not present

## 2016-02-15 DIAGNOSIS — D649 Anemia, unspecified: Secondary | ICD-10-CM | POA: Diagnosis not present

## 2016-02-15 DIAGNOSIS — I519 Heart disease, unspecified: Secondary | ICD-10-CM | POA: Diagnosis not present

## 2016-02-15 DIAGNOSIS — F3341 Major depressive disorder, recurrent, in partial remission: Secondary | ICD-10-CM | POA: Diagnosis not present

## 2016-02-15 DIAGNOSIS — K219 Gastro-esophageal reflux disease without esophagitis: Secondary | ICD-10-CM | POA: Diagnosis not present

## 2016-02-15 DIAGNOSIS — E785 Hyperlipidemia, unspecified: Secondary | ICD-10-CM | POA: Diagnosis not present

## 2016-02-17 DIAGNOSIS — R2681 Unsteadiness on feet: Secondary | ICD-10-CM | POA: Diagnosis not present

## 2016-02-17 DIAGNOSIS — R627 Adult failure to thrive: Secondary | ICD-10-CM | POA: Diagnosis not present

## 2016-02-21 DIAGNOSIS — R2681 Unsteadiness on feet: Secondary | ICD-10-CM | POA: Diagnosis not present

## 2016-02-22 DIAGNOSIS — R2681 Unsteadiness on feet: Secondary | ICD-10-CM | POA: Diagnosis not present

## 2016-02-23 DIAGNOSIS — R2681 Unsteadiness on feet: Secondary | ICD-10-CM | POA: Diagnosis not present

## 2016-02-25 ENCOUNTER — Emergency Department (HOSPITAL_COMMUNITY): Payer: Medicare Other

## 2016-02-25 ENCOUNTER — Emergency Department (HOSPITAL_COMMUNITY)
Admission: EM | Admit: 2016-02-25 | Discharge: 2016-02-25 | Disposition: A | Discharge status: Home / Self Care | Payer: Medicare Other | Attending: Emergency Medicine | Admitting: Emergency Medicine

## 2016-02-25 ENCOUNTER — Encounter (HOSPITAL_COMMUNITY): Payer: Self-pay | Admitting: Certified Nurse Midwife

## 2016-02-25 DIAGNOSIS — I5032 Chronic diastolic (congestive) heart failure: Secondary | ICD-10-CM | POA: Insufficient documentation

## 2016-02-25 DIAGNOSIS — I509 Heart failure, unspecified: Secondary | ICD-10-CM | POA: Diagnosis not present

## 2016-02-25 DIAGNOSIS — Y929 Unspecified place or not applicable: Secondary | ICD-10-CM | POA: Diagnosis not present

## 2016-02-25 DIAGNOSIS — N183 Chronic kidney disease, stage 3 (moderate): Secondary | ICD-10-CM | POA: Insufficient documentation

## 2016-02-25 DIAGNOSIS — E039 Hypothyroidism, unspecified: Secondary | ICD-10-CM | POA: Diagnosis not present

## 2016-02-25 DIAGNOSIS — W228XXA Striking against or struck by other objects, initial encounter: Secondary | ICD-10-CM | POA: Insufficient documentation

## 2016-02-25 DIAGNOSIS — M542 Cervicalgia: Secondary | ICD-10-CM | POA: Diagnosis not present

## 2016-02-25 DIAGNOSIS — Z87891 Personal history of nicotine dependence: Secondary | ICD-10-CM | POA: Insufficient documentation

## 2016-02-25 DIAGNOSIS — I130 Hypertensive heart and chronic kidney disease with heart failure and stage 1 through stage 4 chronic kidney disease, or unspecified chronic kidney disease: Secondary | ICD-10-CM | POA: Diagnosis not present

## 2016-02-25 DIAGNOSIS — S098XXA Other specified injuries of head, initial encounter: Secondary | ICD-10-CM | POA: Diagnosis not present

## 2016-02-25 DIAGNOSIS — R296 Repeated falls: Secondary | ICD-10-CM | POA: Diagnosis not present

## 2016-02-25 DIAGNOSIS — Y9301 Activity, walking, marching and hiking: Secondary | ICD-10-CM | POA: Insufficient documentation

## 2016-02-25 DIAGNOSIS — S299XXA Unspecified injury of thorax, initial encounter: Secondary | ICD-10-CM | POA: Diagnosis not present

## 2016-02-25 DIAGNOSIS — S0081XA Abrasion of other part of head, initial encounter: Secondary | ICD-10-CM | POA: Diagnosis not present

## 2016-02-25 DIAGNOSIS — S0091XA Abrasion of unspecified part of head, initial encounter: Secondary | ICD-10-CM | POA: Diagnosis not present

## 2016-02-25 DIAGNOSIS — Z7982 Long term (current) use of aspirin: Secondary | ICD-10-CM | POA: Insufficient documentation

## 2016-02-25 DIAGNOSIS — W19XXXA Unspecified fall, initial encounter: Secondary | ICD-10-CM

## 2016-02-25 DIAGNOSIS — R51 Headache: Secondary | ICD-10-CM | POA: Diagnosis not present

## 2016-02-25 DIAGNOSIS — S0990XA Unspecified injury of head, initial encounter: Secondary | ICD-10-CM | POA: Diagnosis not present

## 2016-02-25 DIAGNOSIS — Y999 Unspecified external cause status: Secondary | ICD-10-CM | POA: Diagnosis not present

## 2016-02-25 DIAGNOSIS — S199XXA Unspecified injury of neck, initial encounter: Secondary | ICD-10-CM | POA: Diagnosis not present

## 2016-02-25 DIAGNOSIS — T148XXA Other injury of unspecified body region, initial encounter: Secondary | ICD-10-CM

## 2016-02-25 HISTORY — DX: Heart failure, unspecified: I50.9

## 2016-02-25 NOTE — Discharge Instructions (Signed)
Read the information below.  The CT of the head and neck did not show any acute abnormalities. Some chronic lung changes are noted on his chest x-ray.  Please have patient follow up with primary provider on Monday for re-evaluation.  You may return to the Emergency Department at any time for worsening condition or any new symptoms that concern you. Return to ED if develop fever, altered mental status, chest pain, shortness of breath, or any other new/concerning symptoms.

## 2016-02-25 NOTE — ED Notes (Signed)
Patient transported to X-ray 

## 2016-02-25 NOTE — ED Triage Notes (Signed)
Pt arrives via [facility] from [facility] due to a fall. EMS reports that he is wheelchair bound and decided to walk. Pt fell head first and sustained a small head laceration. Pt has hx of dementia. Pt also here for possible arrythmia.

## 2016-02-25 NOTE — ED Provider Notes (Signed)
[facility] DEPT Provider Note   CSN: [id number] Arrival date & time: 02/25/16  1807     History   Chief Complaint Chief Complaint  Patient presents with  . Fall    HPI Michael Bradley is a 82 y.o. male.  Michael Bradley is a 82 y.o. male with h/o dementia oriented to self, HTN, HLD, hypothyroidism, CKD stage III, chronic diastolic CHF presents to ED from [facility] via EMS s/p fall. Per facility staff and nursing notes patient is wheel chair bound, was trying to get out of wheelchair and walk tonight when he fell from a standing position hitting his head. He sustained an abrasion over his left eyebrow. No LOC. He is not on anti-coagulation therapy. No recent fever, cough, shortness of breath, vomiting, diarrhea, or hematuria per nursing staff.   Level V caveat - dementia.       Past Medical History:  Diagnosis Date  . Abnormal chest x-ray 06/2014   chronic stable bilateral interstitial opacities without focal consolidation  . Chest pain, atypical   . CHF (congestive heart failure) ([place])   . Dementia 2011  . ED (erectile dysfunction)   . History of echocardiogram    Echo 4/17: Mild LVH, focal basal hypertrophy, EF 55-60%, normal wall motion, calcified AV annulus, no aortic stenosis, severe LAE  . History of kidney stones   . Hyperlipidemia 2013  . Hypertension   . Hypothyroidism   . 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  . Microcytic anemia 12/2014  . Palpitations     Patient Active Problem List   Diagnosis Date Noted  . Advance care planning   . Altered mental status   . Acute encephalopathy 12/28/2015  . CKD (chronic kidney disease), stage III 12/28/2015  . Hypertension 12/28/2015  . GERD (gastroesophageal reflux disease) 12/28/2015  . Depression 12/28/2015  . Chronic diastolic CHF (congestive heart failure) ([place]) 12/28/2015  . Bleeding gastrointestinal   . GI bleed 06/02/2015  . Delirium 06/02/2015  . UTI  (lower urinary tract infection) 06/02/2015  . Chronic blood loss anemia 06/02/2015  . Hypothyroid 08/16/2011  . Vertigo 08/16/2011  . Dementia 12/07/2010  . Hypercholesterolemia 12/07/2010  . Benign hypertensive heart disease without heart failure 12/07/2010  . 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 SOB (shortness of breath) 12/07/2010    Past Surgical History:  Procedure Laterality Date  . CARDIOVASCULAR STRESS TEST  10/08/2002   EF 63%  . CATARACT EXTRACTION     os 2006-od 2010  . COLONOSCOPY  2009   Dr Delfin Edis: diverticulosis, hemorrhoids, small cecal polyp (tubular adenoma)  . COLONOSCOPY N/A 06/04/2015   Procedure: COLONOSCOPY;  Surgeon: Milus Banister, MD;  Location: [facility] ENDOSCOPY;  Service: Endoscopy;  Laterality: N/A;  . ESOPHAGOGASTRODUODENOSCOPY N/A 06/04/2015   Procedure: ESOPHAGOGASTRODUODENOSCOPY (EGD);  Surgeon: Milus Banister, MD;  Location: [facility];  Service: Endoscopy;  Laterality: N/A;  . LOBECTOMY  1971  . THORACOTOMY Right 1971   REMOVAL OF CARCINOID TUMOR       Home Medications    Prior to Admission medications   Medication Sig Start Date End Date Taking? Authorizing Provider  acetaminophen (TYLENOL) 325 MG tablet Take 325-650 mg by mouth every 6 (six) hours as needed for mild pain, moderate pain or headache.    Yes Historical Provider, MD  aspirin 81 MG tablet Take 81 mg by mouth daily.     Yes Historical Provider, MD  chlorpheniramine (CHLOR-TRIMETON) 4 MG tablet Take 4 mg by mouth 2 (two) times daily as needed for allergies.   Yes Historical Provider, MD  ferrous sulfate 325 (65 FE) MG tablet Take 1 tablet (325 mg total) by mouth daily with breakfast. 06/05/15  Yes Albertine Patricia, MD  omeprazole (PRILOSEC) 20 MG capsule Take 20 mg by mouth at bedtime.    Yes Historical Provider, MD  Polyethyl Glycol-Propyl Glycol (SYSTANE OP) Place 1 drop into both ears daily.   Yes Historical Provider, MD  terazosin (HYTRIN) 2 MG capsule Take 4 mg by mouth at  bedtime. 02/04/13  Yes Darlin Coco, MD  vitamin B-12 (CYANOCOBALAMIN) 1000 MCG tablet Take 1,000 mcg by mouth daily.   Yes Historical Provider, MD  atorvastatin (LIPITOR) 80 MG tablet Take 0.5 tablets (40 mg total) by mouth daily. Patient not taking: Reported on 02/25/2016 05/15/13   Darlin Coco, MD  furosemide (LASIX) 20 MG tablet Take 1 tablet (20 mg total) by mouth daily. Patient not taking: Reported on 02/25/2016 07/07/14   Darlin Coco, MD  galantamine (RAZADYNE ER) 24 MG 24 hr capsule Take 1 capsule (24 mg total) by mouth daily with breakfast. Patient not taking: Reported on 02/25/2016 12/13/14   Star Age, MD  levothyroxine (SYNTHROID, LEVOTHROID) 50 MCG tablet Take 1 tablet (50 mcg total) by mouth daily before breakfast. Patient not taking: Reported on 02/25/2016 01/01/16   Kerney Elbe, DO    Family History Family History  Problem Relation Age of Onset  . Lung cancer Father   . Emphysema Father   . Lung cancer Brother   . Leukemia Mother   . Heart attack Brother   . Kidney cancer Brother     Social History Social History  Substance Use Topics  . Smoking status: Former Smoker    Packs/day: 1.00    Years: 30.00    Quit date: 11/28/1980  . Smokeless tobacco: Never Used  . Alcohol use No     Allergies   Lisinopril; Quinidine; and Quinolones   Review of Systems Review of Systems  Unable to perform ROS: Dementia     Physical Exam Updated Vital Signs BP 150/86 (BP Location: Right Arm)   Pulse 67   Temp 97.1 F (36.2 C) (Oral)   Resp 15   Ht 5\' 5"  (1.651 m)   Wt 61.2 kg   SpO2 95%   BMI 22.47 kg/m   Physical Exam  Constitutional: He appears cachectic. He appears ill ( chronically ). No distress.  HENT:  Head: Normocephalic. Head is with abrasion.    Mouth/Throat: Oropharynx is clear and moist. No oropharyngeal exudate.  Eyes: Conjunctivae and EOM are normal. Pupils are equal, round, and reactive to light. Right eye exhibits no discharge.  Left eye exhibits no discharge. No scleral icterus.  Neck: Normal range of motion and phonation normal. Neck supple. No neck rigidity. Normal range of motion present.  Cardiovascular: Normal rate, regular rhythm, normal heart sounds and intact distal pulses.   No murmur heard. Pulmonary/Chest: Effort normal. No stridor. No respiratory distress. He has no wheezes. He has rales.  B/l rales  Abdominal: Soft. Bowel sounds are normal. He exhibits no distension. There is no tenderness. There is no rigidity, no rebound, no guarding and no CVA tenderness.  Musculoskeletal: Normal range of motion.  No midline spinal tenderness. No other joint tenderness to palpation.  No lower extremity swelling appreciated.   Lymphadenopathy:    He has no cervical adenopathy.  Neurological: He is  alert. GCS eye subscore is 4. GCS verbal subscore is 5. GCS motor subscore is 6.  Patient moves all extremities with ease.   Skin: Skin is warm and dry. He is not diaphoretic.  Psychiatric: He has a normal mood and affect. His behavior is normal.     ED Treatments / Results  Labs (all labs ordered are listed, but only abnormal results are displayed) Labs Reviewed - No data to display  EKG  EKG Interpretation  Date/Time:  Saturday February 25 2016 18:16:23 EST Ventricular Rate:  60 PR Interval:    QRS Duration: 103 QT Interval:  428 QTC Calculation: 428 R Axis:   -43 Text Interpretation:  Sinus rhythm Atrial premature complexes Incomplete RBBB and LAFB Abnormal R-wave progression, early transition Left ventricular hypertrophy Confirmed by Jeneen Rinks  MD, [place] ([id number]) on 02/25/2016 10:52:27 PM       Radiology Dg Chest 2 View  Result Date: 02/25/2016 CLINICAL DATA:  Fall, history hypertension, CHF, 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 Chronic elevation of RIGHT diaphragm with evidence of prior RIGHT basilar lung resection.  Atherosclerotic calcification aorta. Extensive interstitial infiltrates throughout both lungs greater on LEFT similar to previous exam. Scarring versus atelectasis at RIGHT base. No pleural effusion or pneumothorax. Bones demineralized. IMPRESSION: Enlargement of cardiac silhouette. Postsurgical changes RIGHT hemithorax from prior lobectomy. Chronic asymmetric interstitial infiltrates LEFT greater than RIGHT, question recurrent or persistent pulmonary edema versus chronic interstitial lung disease. Electronically Signed   By: Lavonia Dana M.D.   On: 02/25/2016 19:06   Ct Head Wo Contrast  Result Date: 02/25/2016 CLINICAL DATA:  Fall from wheelchair with headaches and neck pain, initial encounter EXAM: CT HEAD WITHOUT CONTRAST CT CERVICAL SPINE WITHOUT CONTRAST TECHNIQUE: Multidetector CT imaging of the head and cervical spine was performed following the standard protocol without intravenous contrast. Multiplanar CT image reconstructions of the cervical spine were also generated. COMPARISON:  12/28/2015 FINDINGS: CT HEAD FINDINGS Brain: Chronic atrophic changes and ischemic changes are again seen and stable. No findings to suggest acute hemorrhage, acute infarction or space-occupying mass lesion are noted. Chronic right-sided subdural hygroma is seen. This was not present on the prior exam. Vascular: No hyperdense vessel or unexpected calcification. Skull: Normal. Negative for fracture or focal lesion. Sinuses/Orbits: Mucosal retention cysts are noted within the maxillary antra bilaterally. Other: None. CT CERVICAL SPINE FINDINGS Alignment: Normal. Skull base and vertebrae: 7 cervical segments are well visualized. Vertebral body height is well maintained. No acute fracture or acute facet abnormality is seen. Facet hypertrophic changes are noted Soft tissues and spinal canal: Chronic calcifications are noted. No acute soft tissue abnormality is seen. Disc levels: Mild disc bulging is noted at multiple levels  without significant central canal stenosis. Upper chest: Negative. IMPRESSION: CT of the head: Chronic atrophic and ischemic changes without acute abnormality. Small subdural hygroma on the right of a chronic nature but new from the prior exam. CT of the cervical spine: Degenerative change without acute abnormality. Electronically Signed   By: Inez Catalina M.D.   On: 02/25/2016 19:31   Ct Cervical Spine Wo Contrast  Result Date: 02/25/2016 CLINICAL DATA:  Fall from wheelchair with headaches and neck pain, initial encounter EXAM: CT HEAD WITHOUT CONTRAST CT CERVICAL SPINE WITHOUT CONTRAST TECHNIQUE: Multidetector CT imaging of the head and cervical spine was performed following the standard protocol without intravenous contrast. Multiplanar CT image reconstructions of the cervical spine were also  generated. COMPARISON:  12/28/2015 FINDINGS: CT HEAD FINDINGS Brain: Chronic atrophic changes and ischemic changes are again seen and stable. No findings to suggest acute hemorrhage, acute infarction or space-occupying mass lesion are noted. Chronic right-sided subdural hygroma is seen. This was not present on the prior exam. Vascular: No hyperdense vessel or unexpected calcification. Skull: Normal. Negative for fracture or focal lesion. Sinuses/Orbits: Mucosal retention cysts are noted within the maxillary antra bilaterally. Other: None. CT CERVICAL SPINE FINDINGS Alignment: Normal. Skull base and vertebrae: 7 cervical segments are well visualized. Vertebral body height is well maintained. No acute fracture or acute facet abnormality is seen. Facet hypertrophic changes are noted Soft tissues and spinal canal: Chronic calcifications are noted. No acute soft tissue abnormality is seen. Disc levels: Mild disc bulging is noted at multiple levels without significant central canal stenosis. Upper chest: Negative. IMPRESSION: CT of the head: Chronic atrophic and ischemic changes without acute abnormality. Small subdural  hygroma on the right of a chronic nature but new from the prior exam. CT of the cervical spine: Degenerative change without acute abnormality. Electronically Signed   By: Inez Catalina M.D.   On: 02/25/2016 19:31    Procedures Procedures (including critical care time)  Medications Ordered in ED Medications - No data to display   Initial Impression / Assessment and Plan / ED Course  I have reviewed the triage vital signs and the nursing notes.  Pertinent labs & imaging results that were available during my care of the patient were reviewed by me and considered in my medical decision making (see chart for details).  Clinical Course     Patient presents to ED s/p fall. Patient is wheelchair bound, tried to get out of wheelchair to walk and fell, striking head on ground. No LOC. No anti-coagulation therapy. Patient is afebrile and non-toxic appearing in NAD. Patient is cachectic and chronically ill-appearing. Initial vital signs stable. Superficial abrasion noted to left forehead. No battle sign or racoon eyes. No hemotympanum. No midline spinal tenderness. No other tenderness to palpation. Patient moves all extremities with ease. Heart RRR. Lungs b/l rales, respirations unlabored, no accessory muscle use, re-assessment shows O2 sats 91% RA, 2L [place] O2 initiated. Will CT head/neck and CXR.   CT head shows no acute hemorrhage, infarction, mass lesion, or skull fracture; of note, chronic atrophic and ischemic changes as well as right subdural hygroma chronic in appearance. CT neck shows no traumatic fractures or subluxation, of note, degenerative changes appreciated. CXR shows enlarged cardiac silhouette, postsurgical changes in right lung s/p lobectomy, chronic asymmetric interstitial infiltrates L > R - ?chronic interstitial lung dz vs. ?persistent pulmonary edema. No lower extremity edema or JVD appreciated. EKG shows sinus rhythm with PAC, incomplete RBBB, LAFB, LVH, and abnormal r-wave progression.     Family at bedside and per family patient at baseline. Discussed with family doing bloodwork, family member at this time declined further assessment. O2 sats on room air ~93%, inconsistent recordings secondary to pt fidgeting and removing pulse ox. Respirations remain unlabored with no accessory muscle use. Suspect fall mechanical in nature. Wound cleaned and dressing applied. Discussed with family results and plan. Follow up with PCP on Monday for re-evaluation. Strict return precautions given. Family member voiced understanding and is agreeable.     Final Clinical Impressions(s) / ED Diagnoses   Final diagnoses:  Fall, initial encounter    New Prescriptions Discharge Medication List as of 02/25/2016 10:05 PM       Roxanna Mew, PA-C  02/27/16 0100    Tanna Furry, MD 03/04/16 1505

## 2016-02-27 DIAGNOSIS — R2681 Unsteadiness on feet: Secondary | ICD-10-CM | POA: Diagnosis not present

## 2016-02-28 DIAGNOSIS — R2681 Unsteadiness on feet: Secondary | ICD-10-CM | POA: Diagnosis not present

## 2016-03-02 DIAGNOSIS — R2681 Unsteadiness on feet: Secondary | ICD-10-CM | POA: Diagnosis not present

## 2016-03-06 DIAGNOSIS — R600 Localized edema: Secondary | ICD-10-CM | POA: Diagnosis not present

## 2016-03-06 DIAGNOSIS — M79641 Pain in right hand: Secondary | ICD-10-CM | POA: Diagnosis not present

## 2016-03-07 DIAGNOSIS — F3341 Major depressive disorder, recurrent, in partial remission: Secondary | ICD-10-CM | POA: Diagnosis not present

## 2016-03-07 DIAGNOSIS — F0150 Vascular dementia without behavioral disturbance: Secondary | ICD-10-CM | POA: Diagnosis not present

## 2016-03-09 DIAGNOSIS — M6281 Muscle weakness (generalized): Secondary | ICD-10-CM | POA: Diagnosis not present

## 2016-03-09 DIAGNOSIS — R296 Repeated falls: Secondary | ICD-10-CM | POA: Diagnosis not present

## 2016-03-09 DIAGNOSIS — R601 Generalized edema: Secondary | ICD-10-CM | POA: Diagnosis not present

## 2016-03-09 DIAGNOSIS — M79641 Pain in right hand: Secondary | ICD-10-CM | POA: Diagnosis not present

## 2016-03-14 DIAGNOSIS — M6281 Muscle weakness (generalized): Secondary | ICD-10-CM | POA: Diagnosis not present

## 2016-03-14 DIAGNOSIS — R296 Repeated falls: Secondary | ICD-10-CM | POA: Diagnosis not present

## 2016-03-14 DIAGNOSIS — K219 Gastro-esophageal reflux disease without esophagitis: Secondary | ICD-10-CM | POA: Diagnosis not present

## 2016-03-14 DIAGNOSIS — E039 Hypothyroidism, unspecified: Secondary | ICD-10-CM | POA: Diagnosis not present

## 2016-03-14 DIAGNOSIS — E785 Hyperlipidemia, unspecified: Secondary | ICD-10-CM | POA: Diagnosis not present

## 2016-03-14 DIAGNOSIS — I519 Heart disease, unspecified: Secondary | ICD-10-CM | POA: Diagnosis not present

## 2016-03-14 DIAGNOSIS — I10 Essential (primary) hypertension: Secondary | ICD-10-CM | POA: Diagnosis not present

## 2016-03-14 DIAGNOSIS — H04123 Dry eye syndrome of bilateral lacrimal glands: Secondary | ICD-10-CM | POA: Diagnosis not present

## 2016-03-14 DIAGNOSIS — R601 Generalized edema: Secondary | ICD-10-CM | POA: Diagnosis not present

## 2016-03-14 DIAGNOSIS — D649 Anemia, unspecified: Secondary | ICD-10-CM | POA: Diagnosis not present

## 2016-03-14 DIAGNOSIS — M79641 Pain in right hand: Secondary | ICD-10-CM | POA: Diagnosis not present

## 2016-03-15 DIAGNOSIS — R601 Generalized edema: Secondary | ICD-10-CM | POA: Diagnosis not present

## 2016-03-15 DIAGNOSIS — R296 Repeated falls: Secondary | ICD-10-CM | POA: Diagnosis not present

## 2016-03-15 DIAGNOSIS — M79641 Pain in right hand: Secondary | ICD-10-CM | POA: Diagnosis not present

## 2016-03-15 DIAGNOSIS — M6281 Muscle weakness (generalized): Secondary | ICD-10-CM | POA: Diagnosis not present

## 2016-03-16 DIAGNOSIS — E785 Hyperlipidemia, unspecified: Secondary | ICD-10-CM | POA: Diagnosis not present

## 2016-03-16 DIAGNOSIS — F0150 Vascular dementia without behavioral disturbance: Secondary | ICD-10-CM | POA: Diagnosis not present

## 2016-03-16 DIAGNOSIS — I519 Heart disease, unspecified: Secondary | ICD-10-CM | POA: Diagnosis not present

## 2016-03-16 DIAGNOSIS — K219 Gastro-esophageal reflux disease without esophagitis: Secondary | ICD-10-CM | POA: Diagnosis not present

## 2016-03-16 DIAGNOSIS — H04123 Dry eye syndrome of bilateral lacrimal glands: Secondary | ICD-10-CM | POA: Diagnosis not present

## 2016-03-16 DIAGNOSIS — D649 Anemia, unspecified: Secondary | ICD-10-CM | POA: Diagnosis not present

## 2016-03-16 DIAGNOSIS — F3341 Major depressive disorder, recurrent, in partial remission: Secondary | ICD-10-CM | POA: Diagnosis not present

## 2016-03-20 ENCOUNTER — Encounter (INDEPENDENT_AMBULATORY_CARE_PROVIDER_SITE_OTHER): Payer: Medicare Other | Admitting: Ophthalmology

## 2016-03-22 DIAGNOSIS — M6281 Muscle weakness (generalized): Secondary | ICD-10-CM | POA: Diagnosis not present

## 2016-03-22 DIAGNOSIS — M79641 Pain in right hand: Secondary | ICD-10-CM | POA: Diagnosis not present

## 2016-03-22 DIAGNOSIS — R601 Generalized edema: Secondary | ICD-10-CM | POA: Diagnosis not present

## 2016-03-22 DIAGNOSIS — R296 Repeated falls: Secondary | ICD-10-CM | POA: Diagnosis not present

## 2016-03-23 DIAGNOSIS — M6281 Muscle weakness (generalized): Secondary | ICD-10-CM | POA: Diagnosis not present

## 2016-03-23 DIAGNOSIS — R296 Repeated falls: Secondary | ICD-10-CM | POA: Diagnosis not present

## 2016-03-23 DIAGNOSIS — M79641 Pain in right hand: Secondary | ICD-10-CM | POA: Diagnosis not present

## 2016-03-23 DIAGNOSIS — R601 Generalized edema: Secondary | ICD-10-CM | POA: Diagnosis not present

## 2016-03-26 DIAGNOSIS — R296 Repeated falls: Secondary | ICD-10-CM | POA: Diagnosis not present

## 2016-03-26 DIAGNOSIS — M6281 Muscle weakness (generalized): Secondary | ICD-10-CM | POA: Diagnosis not present

## 2016-03-26 DIAGNOSIS — M79641 Pain in right hand: Secondary | ICD-10-CM | POA: Diagnosis not present

## 2016-03-26 DIAGNOSIS — R601 Generalized edema: Secondary | ICD-10-CM | POA: Diagnosis not present

## 2016-03-27 DIAGNOSIS — M79641 Pain in right hand: Secondary | ICD-10-CM | POA: Diagnosis not present

## 2016-03-27 DIAGNOSIS — R601 Generalized edema: Secondary | ICD-10-CM | POA: Diagnosis not present

## 2016-03-27 DIAGNOSIS — R296 Repeated falls: Secondary | ICD-10-CM | POA: Diagnosis not present

## 2016-03-27 DIAGNOSIS — M6281 Muscle weakness (generalized): Secondary | ICD-10-CM | POA: Diagnosis not present

## 2016-03-28 DIAGNOSIS — R296 Repeated falls: Secondary | ICD-10-CM | POA: Diagnosis not present

## 2016-03-28 DIAGNOSIS — R601 Generalized edema: Secondary | ICD-10-CM | POA: Diagnosis not present

## 2016-03-28 DIAGNOSIS — M6281 Muscle weakness (generalized): Secondary | ICD-10-CM | POA: Diagnosis not present

## 2016-03-28 DIAGNOSIS — M79641 Pain in right hand: Secondary | ICD-10-CM | POA: Diagnosis not present

## 2016-04-04 DIAGNOSIS — E039 Hypothyroidism, unspecified: Secondary | ICD-10-CM | POA: Diagnosis not present

## 2016-04-04 DIAGNOSIS — E785 Hyperlipidemia, unspecified: Secondary | ICD-10-CM | POA: Diagnosis not present

## 2016-04-04 DIAGNOSIS — I10 Essential (primary) hypertension: Secondary | ICD-10-CM | POA: Diagnosis not present

## 2016-04-04 DIAGNOSIS — D649 Anemia, unspecified: Secondary | ICD-10-CM | POA: Diagnosis not present

## 2016-04-06 DIAGNOSIS — M6281 Muscle weakness (generalized): Secondary | ICD-10-CM | POA: Diagnosis not present

## 2016-04-06 DIAGNOSIS — M79641 Pain in right hand: Secondary | ICD-10-CM | POA: Diagnosis not present

## 2016-04-06 DIAGNOSIS — R601 Generalized edema: Secondary | ICD-10-CM | POA: Diagnosis not present

## 2016-04-06 DIAGNOSIS — R296 Repeated falls: Secondary | ICD-10-CM | POA: Diagnosis not present

## 2016-04-07 DIAGNOSIS — M6281 Muscle weakness (generalized): Secondary | ICD-10-CM | POA: Diagnosis not present

## 2016-04-07 DIAGNOSIS — R296 Repeated falls: Secondary | ICD-10-CM | POA: Diagnosis not present

## 2016-04-07 DIAGNOSIS — R601 Generalized edema: Secondary | ICD-10-CM | POA: Diagnosis not present

## 2016-04-07 DIAGNOSIS — M79641 Pain in right hand: Secondary | ICD-10-CM | POA: Diagnosis not present

## 2016-04-07 DIAGNOSIS — Z0000 Encounter for general adult medical examination without abnormal findings: Secondary | ICD-10-CM | POA: Diagnosis not present

## 2016-04-09 DIAGNOSIS — M6281 Muscle weakness (generalized): Secondary | ICD-10-CM | POA: Diagnosis not present

## 2016-04-09 DIAGNOSIS — R296 Repeated falls: Secondary | ICD-10-CM | POA: Diagnosis not present

## 2016-04-09 DIAGNOSIS — R601 Generalized edema: Secondary | ICD-10-CM | POA: Diagnosis not present

## 2016-04-09 DIAGNOSIS — M79641 Pain in right hand: Secondary | ICD-10-CM | POA: Diagnosis not present

## 2016-04-10 DIAGNOSIS — R601 Generalized edema: Secondary | ICD-10-CM | POA: Diagnosis not present

## 2016-04-10 DIAGNOSIS — M6281 Muscle weakness (generalized): Secondary | ICD-10-CM | POA: Diagnosis not present

## 2016-04-10 DIAGNOSIS — R296 Repeated falls: Secondary | ICD-10-CM | POA: Diagnosis not present

## 2016-04-10 DIAGNOSIS — M79641 Pain in right hand: Secondary | ICD-10-CM | POA: Diagnosis not present

## 2016-04-12 DIAGNOSIS — M79641 Pain in right hand: Secondary | ICD-10-CM | POA: Diagnosis not present

## 2016-04-12 DIAGNOSIS — R601 Generalized edema: Secondary | ICD-10-CM | POA: Diagnosis not present

## 2016-04-12 DIAGNOSIS — M6281 Muscle weakness (generalized): Secondary | ICD-10-CM | POA: Diagnosis not present

## 2016-04-12 DIAGNOSIS — R296 Repeated falls: Secondary | ICD-10-CM | POA: Diagnosis not present

## 2016-04-16 DIAGNOSIS — R601 Generalized edema: Secondary | ICD-10-CM | POA: Diagnosis not present

## 2016-04-16 DIAGNOSIS — M6281 Muscle weakness (generalized): Secondary | ICD-10-CM | POA: Diagnosis not present

## 2016-04-16 DIAGNOSIS — R296 Repeated falls: Secondary | ICD-10-CM | POA: Diagnosis not present

## 2016-04-16 DIAGNOSIS — M79641 Pain in right hand: Secondary | ICD-10-CM | POA: Diagnosis not present

## 2016-04-17 DIAGNOSIS — H04123 Dry eye syndrome of bilateral lacrimal glands: Secondary | ICD-10-CM | POA: Diagnosis not present

## 2016-04-17 DIAGNOSIS — I519 Heart disease, unspecified: Secondary | ICD-10-CM | POA: Diagnosis not present

## 2016-04-17 DIAGNOSIS — F3341 Major depressive disorder, recurrent, in partial remission: Secondary | ICD-10-CM | POA: Diagnosis not present

## 2016-04-17 DIAGNOSIS — K219 Gastro-esophageal reflux disease without esophagitis: Secondary | ICD-10-CM | POA: Diagnosis not present

## 2016-04-17 DIAGNOSIS — F0150 Vascular dementia without behavioral disturbance: Secondary | ICD-10-CM | POA: Diagnosis not present

## 2016-04-17 DIAGNOSIS — E785 Hyperlipidemia, unspecified: Secondary | ICD-10-CM | POA: Diagnosis not present

## 2016-04-17 DIAGNOSIS — D649 Anemia, unspecified: Secondary | ICD-10-CM | POA: Diagnosis not present

## 2016-04-18 DIAGNOSIS — R296 Repeated falls: Secondary | ICD-10-CM | POA: Diagnosis not present

## 2016-04-18 DIAGNOSIS — R601 Generalized edema: Secondary | ICD-10-CM | POA: Diagnosis not present

## 2016-04-18 DIAGNOSIS — M6281 Muscle weakness (generalized): Secondary | ICD-10-CM | POA: Diagnosis not present

## 2016-04-18 DIAGNOSIS — M79641 Pain in right hand: Secondary | ICD-10-CM | POA: Diagnosis not present

## 2016-04-19 DIAGNOSIS — R601 Generalized edema: Secondary | ICD-10-CM | POA: Diagnosis not present

## 2016-04-19 DIAGNOSIS — R296 Repeated falls: Secondary | ICD-10-CM | POA: Diagnosis not present

## 2016-04-19 DIAGNOSIS — M6281 Muscle weakness (generalized): Secondary | ICD-10-CM | POA: Diagnosis not present

## 2016-04-19 DIAGNOSIS — M79641 Pain in right hand: Secondary | ICD-10-CM | POA: Diagnosis not present

## 2016-04-23 DIAGNOSIS — M79641 Pain in right hand: Secondary | ICD-10-CM | POA: Diagnosis not present

## 2016-04-23 DIAGNOSIS — M6281 Muscle weakness (generalized): Secondary | ICD-10-CM | POA: Diagnosis not present

## 2016-04-23 DIAGNOSIS — R296 Repeated falls: Secondary | ICD-10-CM | POA: Diagnosis not present

## 2016-04-23 DIAGNOSIS — R601 Generalized edema: Secondary | ICD-10-CM | POA: Diagnosis not present

## 2016-04-25 DIAGNOSIS — F3341 Major depressive disorder, recurrent, in partial remission: Secondary | ICD-10-CM | POA: Diagnosis not present

## 2016-04-25 DIAGNOSIS — M6281 Muscle weakness (generalized): Secondary | ICD-10-CM | POA: Diagnosis not present

## 2016-04-25 DIAGNOSIS — F0150 Vascular dementia without behavioral disturbance: Secondary | ICD-10-CM | POA: Diagnosis not present

## 2016-04-25 DIAGNOSIS — M79641 Pain in right hand: Secondary | ICD-10-CM | POA: Diagnosis not present

## 2016-04-25 DIAGNOSIS — R601 Generalized edema: Secondary | ICD-10-CM | POA: Diagnosis not present

## 2016-04-25 DIAGNOSIS — R296 Repeated falls: Secondary | ICD-10-CM | POA: Diagnosis not present

## 2016-04-27 DIAGNOSIS — R601 Generalized edema: Secondary | ICD-10-CM | POA: Diagnosis not present

## 2016-04-27 DIAGNOSIS — M6281 Muscle weakness (generalized): Secondary | ICD-10-CM | POA: Diagnosis not present

## 2016-04-27 DIAGNOSIS — M79641 Pain in right hand: Secondary | ICD-10-CM | POA: Diagnosis not present

## 2016-04-27 DIAGNOSIS — R296 Repeated falls: Secondary | ICD-10-CM | POA: Diagnosis not present

## 2016-05-04 DIAGNOSIS — B351 Tinea unguium: Secondary | ICD-10-CM | POA: Diagnosis not present

## 2016-05-09 DIAGNOSIS — I519 Heart disease, unspecified: Secondary | ICD-10-CM | POA: Diagnosis not present

## 2016-05-09 DIAGNOSIS — I10 Essential (primary) hypertension: Secondary | ICD-10-CM | POA: Diagnosis not present

## 2016-05-09 DIAGNOSIS — D649 Anemia, unspecified: Secondary | ICD-10-CM | POA: Diagnosis not present

## 2016-05-09 DIAGNOSIS — R296 Repeated falls: Secondary | ICD-10-CM | POA: Diagnosis not present

## 2016-05-09 DIAGNOSIS — E785 Hyperlipidemia, unspecified: Secondary | ICD-10-CM | POA: Diagnosis not present

## 2016-05-09 DIAGNOSIS — H04123 Dry eye syndrome of bilateral lacrimal glands: Secondary | ICD-10-CM | POA: Diagnosis not present

## 2016-05-09 DIAGNOSIS — E039 Hypothyroidism, unspecified: Secondary | ICD-10-CM | POA: Diagnosis not present

## 2016-05-14 DIAGNOSIS — F0150 Vascular dementia without behavioral disturbance: Secondary | ICD-10-CM | POA: Diagnosis not present

## 2016-05-14 DIAGNOSIS — H04123 Dry eye syndrome of bilateral lacrimal glands: Secondary | ICD-10-CM | POA: Diagnosis not present

## 2016-05-14 DIAGNOSIS — F3341 Major depressive disorder, recurrent, in partial remission: Secondary | ICD-10-CM | POA: Diagnosis not present

## 2016-05-14 DIAGNOSIS — D649 Anemia, unspecified: Secondary | ICD-10-CM | POA: Diagnosis not present

## 2016-05-14 DIAGNOSIS — E785 Hyperlipidemia, unspecified: Secondary | ICD-10-CM | POA: Diagnosis not present

## 2016-05-14 DIAGNOSIS — K219 Gastro-esophageal reflux disease without esophagitis: Secondary | ICD-10-CM | POA: Diagnosis not present

## 2016-05-14 DIAGNOSIS — I519 Heart disease, unspecified: Secondary | ICD-10-CM | POA: Diagnosis not present

## 2016-05-21 IMAGING — CR DG CHEST 2V
2 series · 2 of 2 positions shown · non-contrast
Comparison: 07/02/2014

CLINICAL DATA: Altered mental status

EXAM:
CHEST  2 VIEW

[w chest lat]
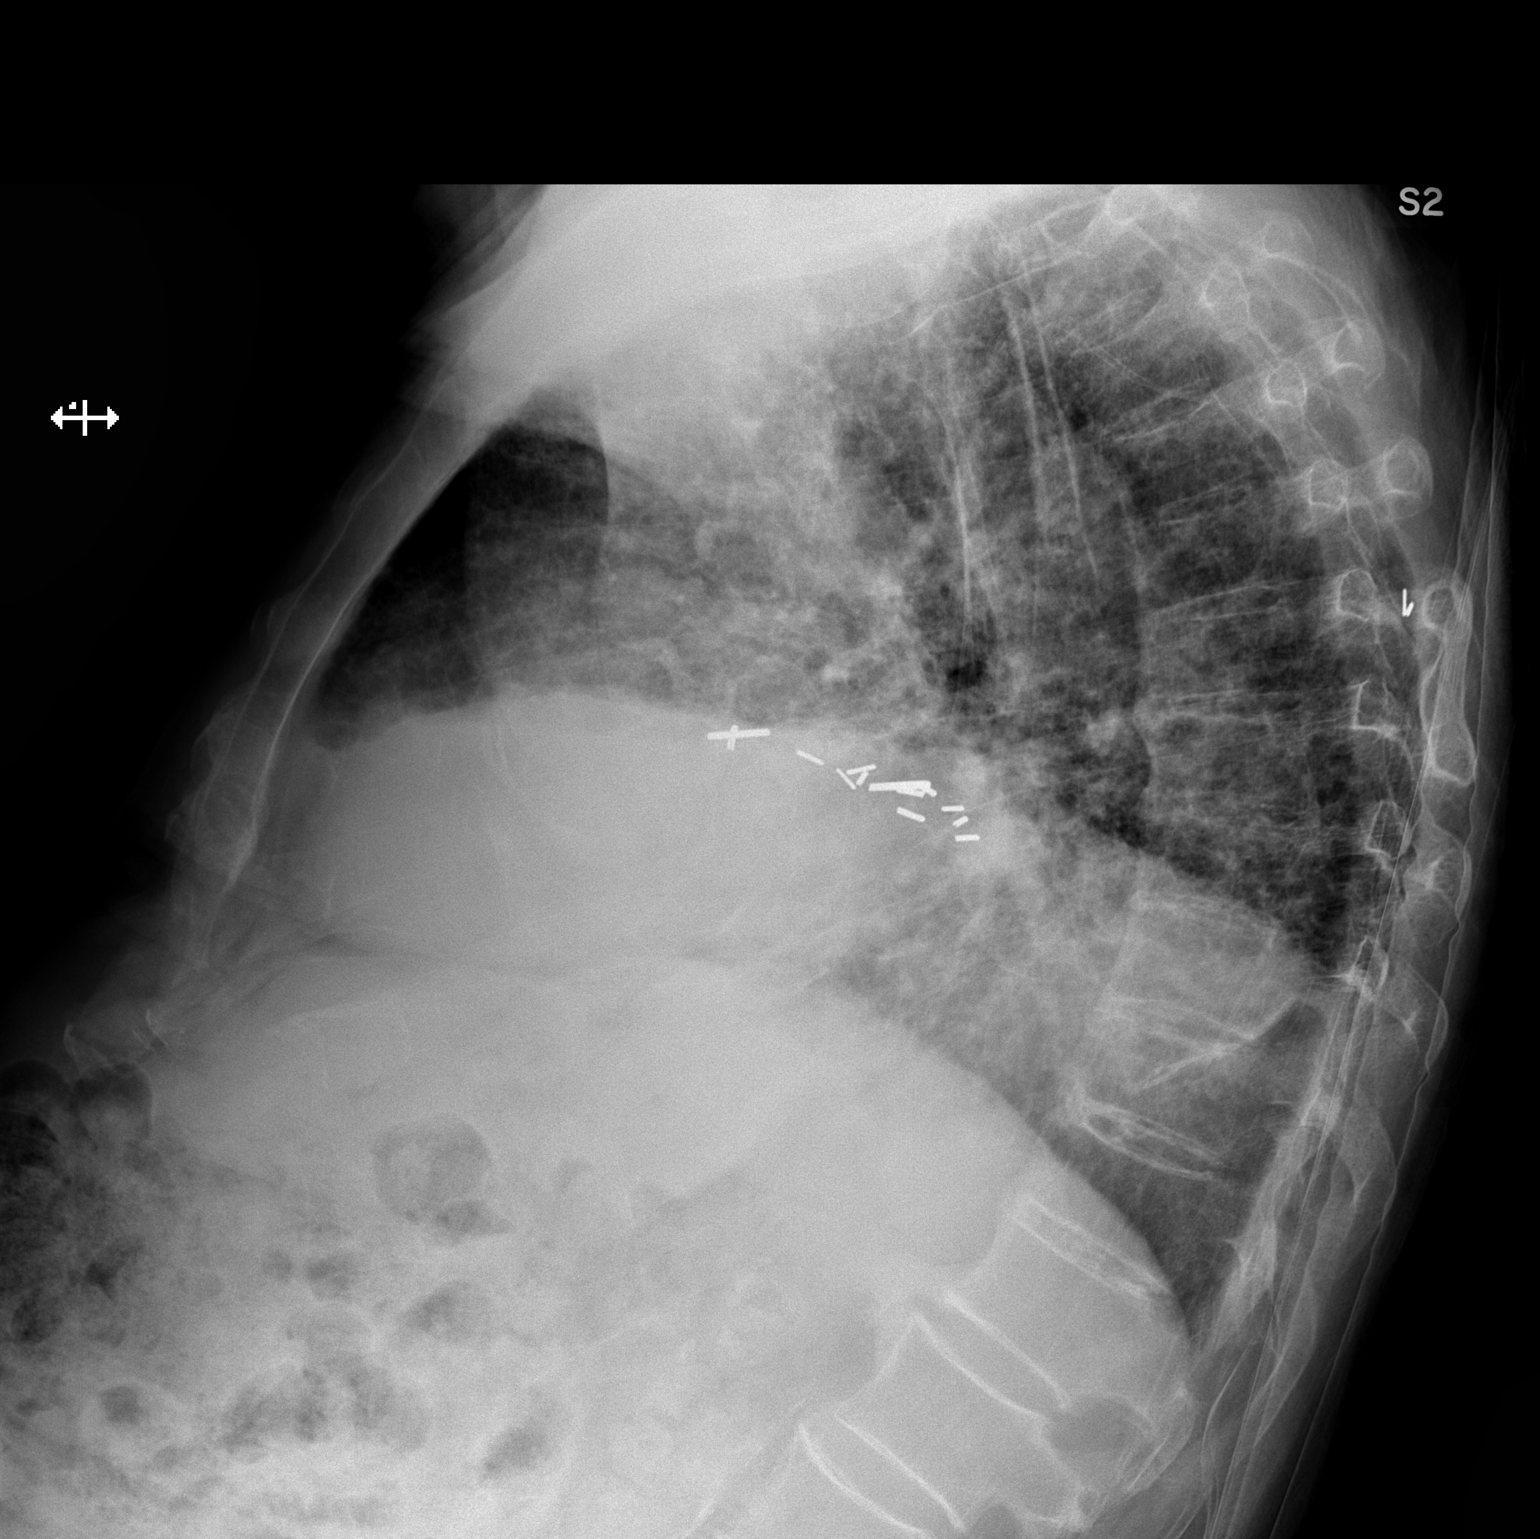

[x chest ap]
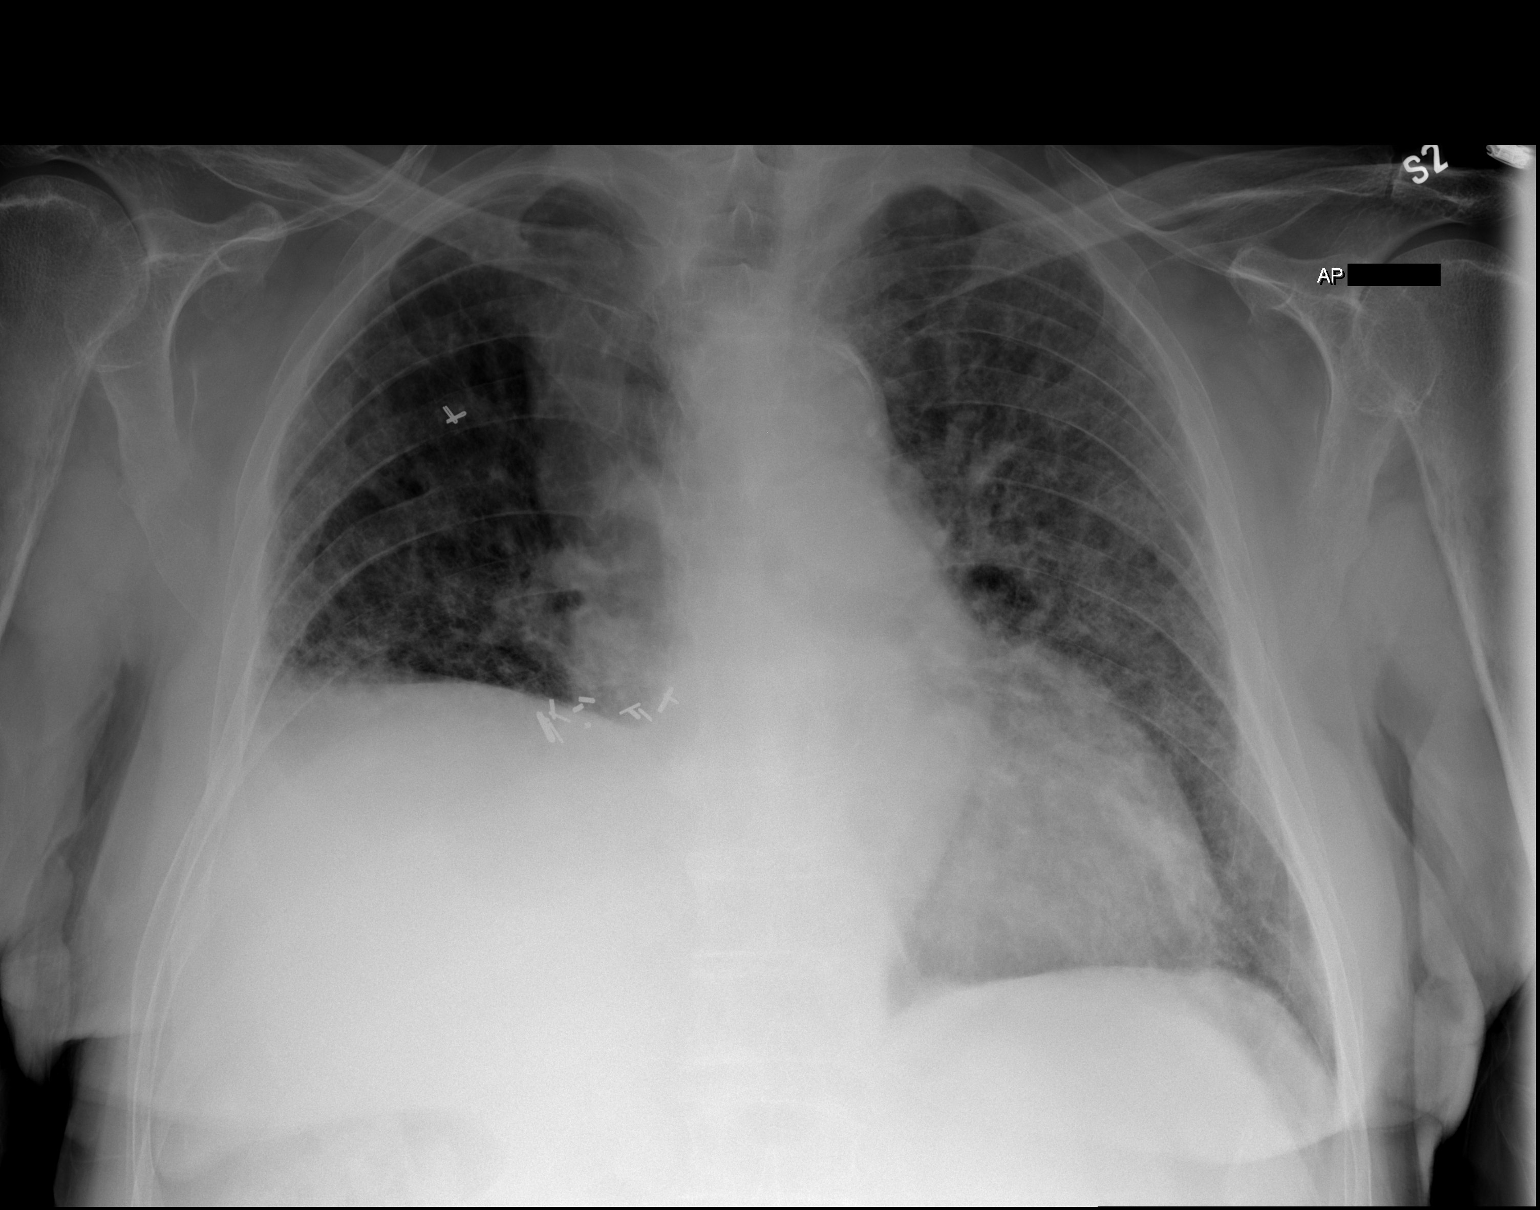

[2 of 2 positions shown; findings below may reference images not displayed]

FINDINGS: Heart remains mildly enlarged. Diffuse interstitial prominence and
hazy airspace disease throughout both lungs. Right hemidiaphragm
remains elevated. Small right pleural effusion is suspected.
Pulmonary vasculature is indistinct. No left pleural effusion. No
pneumothorax. Stable thoracic spine.
IMPRESSION: The above findings are worrisome for pulmonary edema and CHF.

There is chronic elevation of the right hemidiaphragm.

## 2016-05-30 DIAGNOSIS — E039 Hypothyroidism, unspecified: Secondary | ICD-10-CM | POA: Diagnosis not present

## 2016-05-30 DIAGNOSIS — H04123 Dry eye syndrome of bilateral lacrimal glands: Secondary | ICD-10-CM | POA: Diagnosis not present

## 2016-05-30 DIAGNOSIS — D649 Anemia, unspecified: Secondary | ICD-10-CM | POA: Diagnosis not present

## 2016-05-30 DIAGNOSIS — I10 Essential (primary) hypertension: Secondary | ICD-10-CM | POA: Diagnosis not present

## 2016-05-30 DIAGNOSIS — I519 Heart disease, unspecified: Secondary | ICD-10-CM | POA: Diagnosis not present

## 2016-05-30 DIAGNOSIS — D510 Vitamin B12 deficiency anemia due to intrinsic factor deficiency: Secondary | ICD-10-CM | POA: Diagnosis not present

## 2016-05-30 DIAGNOSIS — E785 Hyperlipidemia, unspecified: Secondary | ICD-10-CM | POA: Diagnosis not present

## 2016-05-30 DIAGNOSIS — R296 Repeated falls: Secondary | ICD-10-CM | POA: Diagnosis not present

## 2016-06-06 DIAGNOSIS — F3341 Major depressive disorder, recurrent, in partial remission: Secondary | ICD-10-CM | POA: Diagnosis not present

## 2016-06-06 DIAGNOSIS — F0150 Vascular dementia without behavioral disturbance: Secondary | ICD-10-CM | POA: Diagnosis not present

## 2016-06-08 DIAGNOSIS — R634 Abnormal weight loss: Secondary | ICD-10-CM | POA: Diagnosis not present

## 2016-06-13 DIAGNOSIS — H04123 Dry eye syndrome of bilateral lacrimal glands: Secondary | ICD-10-CM | POA: Diagnosis not present

## 2016-06-13 DIAGNOSIS — E785 Hyperlipidemia, unspecified: Secondary | ICD-10-CM | POA: Diagnosis not present

## 2016-06-13 DIAGNOSIS — D649 Anemia, unspecified: Secondary | ICD-10-CM | POA: Diagnosis not present

## 2016-06-13 DIAGNOSIS — I519 Heart disease, unspecified: Secondary | ICD-10-CM | POA: Diagnosis not present

## 2016-06-13 DIAGNOSIS — F0150 Vascular dementia without behavioral disturbance: Secondary | ICD-10-CM | POA: Diagnosis not present

## 2016-06-13 DIAGNOSIS — F3341 Major depressive disorder, recurrent, in partial remission: Secondary | ICD-10-CM | POA: Diagnosis not present

## 2016-06-27 DIAGNOSIS — D649 Anemia, unspecified: Secondary | ICD-10-CM | POA: Diagnosis not present

## 2016-06-27 DIAGNOSIS — I10 Essential (primary) hypertension: Secondary | ICD-10-CM | POA: Diagnosis not present

## 2016-06-27 DIAGNOSIS — E039 Hypothyroidism, unspecified: Secondary | ICD-10-CM | POA: Diagnosis not present

## 2016-06-27 DIAGNOSIS — I519 Heart disease, unspecified: Secondary | ICD-10-CM | POA: Diagnosis not present

## 2016-06-27 DIAGNOSIS — D510 Vitamin B12 deficiency anemia due to intrinsic factor deficiency: Secondary | ICD-10-CM | POA: Diagnosis not present

## 2016-06-27 DIAGNOSIS — R296 Repeated falls: Secondary | ICD-10-CM | POA: Diagnosis not present

## 2016-06-27 DIAGNOSIS — E785 Hyperlipidemia, unspecified: Secondary | ICD-10-CM | POA: Diagnosis not present

## 2016-06-27 DIAGNOSIS — H04123 Dry eye syndrome of bilateral lacrimal glands: Secondary | ICD-10-CM | POA: Diagnosis not present

## 2016-07-03 DIAGNOSIS — I519 Heart disease, unspecified: Secondary | ICD-10-CM | POA: Diagnosis not present

## 2016-07-03 DIAGNOSIS — D649 Anemia, unspecified: Secondary | ICD-10-CM | POA: Diagnosis not present

## 2016-07-03 DIAGNOSIS — I10 Essential (primary) hypertension: Secondary | ICD-10-CM | POA: Diagnosis not present

## 2016-07-03 DIAGNOSIS — H04123 Dry eye syndrome of bilateral lacrimal glands: Secondary | ICD-10-CM | POA: Diagnosis not present

## 2016-07-03 DIAGNOSIS — E039 Hypothyroidism, unspecified: Secondary | ICD-10-CM | POA: Diagnosis not present

## 2016-07-03 DIAGNOSIS — D510 Vitamin B12 deficiency anemia due to intrinsic factor deficiency: Secondary | ICD-10-CM | POA: Diagnosis not present

## 2016-07-03 DIAGNOSIS — R296 Repeated falls: Secondary | ICD-10-CM | POA: Diagnosis not present

## 2016-07-03 DIAGNOSIS — E785 Hyperlipidemia, unspecified: Secondary | ICD-10-CM | POA: Diagnosis not present

## 2016-07-04 DIAGNOSIS — D649 Anemia, unspecified: Secondary | ICD-10-CM | POA: Diagnosis not present

## 2016-07-04 DIAGNOSIS — E039 Hypothyroidism, unspecified: Secondary | ICD-10-CM | POA: Diagnosis not present

## 2016-07-04 DIAGNOSIS — I10 Essential (primary) hypertension: Secondary | ICD-10-CM | POA: Diagnosis not present

## 2016-07-04 DIAGNOSIS — D510 Vitamin B12 deficiency anemia due to intrinsic factor deficiency: Secondary | ICD-10-CM | POA: Diagnosis not present

## 2016-07-13 DIAGNOSIS — F0150 Vascular dementia without behavioral disturbance: Secondary | ICD-10-CM | POA: Diagnosis not present

## 2016-07-13 DIAGNOSIS — F3341 Major depressive disorder, recurrent, in partial remission: Secondary | ICD-10-CM | POA: Diagnosis not present

## 2016-07-13 DIAGNOSIS — I519 Heart disease, unspecified: Secondary | ICD-10-CM | POA: Diagnosis not present

## 2016-07-13 DIAGNOSIS — D510 Vitamin B12 deficiency anemia due to intrinsic factor deficiency: Secondary | ICD-10-CM | POA: Diagnosis not present

## 2016-07-13 DIAGNOSIS — H04123 Dry eye syndrome of bilateral lacrimal glands: Secondary | ICD-10-CM | POA: Diagnosis not present

## 2016-07-13 DIAGNOSIS — D649 Anemia, unspecified: Secondary | ICD-10-CM | POA: Diagnosis not present

## 2016-07-13 DIAGNOSIS — E785 Hyperlipidemia, unspecified: Secondary | ICD-10-CM | POA: Diagnosis not present

## 2016-07-18 DIAGNOSIS — F0150 Vascular dementia without behavioral disturbance: Secondary | ICD-10-CM | POA: Diagnosis not present

## 2016-07-18 DIAGNOSIS — F3341 Major depressive disorder, recurrent, in partial remission: Secondary | ICD-10-CM | POA: Diagnosis not present

## 2016-08-01 DIAGNOSIS — I10 Essential (primary) hypertension: Secondary | ICD-10-CM | POA: Diagnosis not present

## 2016-08-01 DIAGNOSIS — R296 Repeated falls: Secondary | ICD-10-CM | POA: Diagnosis not present

## 2016-08-01 DIAGNOSIS — N390 Urinary tract infection, site not specified: Secondary | ICD-10-CM | POA: Diagnosis not present

## 2016-08-05 ENCOUNTER — Inpatient Hospital Stay (HOSPITAL_COMMUNITY)
Admission: EM | Admit: 2016-08-05 | Discharge: 2016-08-17 | DRG: 189 | Disposition: E | Payer: Medicare Other | Attending: Family Medicine | Admitting: Family Medicine

## 2016-08-05 DIAGNOSIS — Z681 Body mass index (BMI) 19 or less, adult: Secondary | ICD-10-CM | POA: Diagnosis not present

## 2016-08-05 DIAGNOSIS — Z8051 Family history of malignant neoplasm of kidney: Secondary | ICD-10-CM | POA: Diagnosis not present

## 2016-08-05 DIAGNOSIS — J9601 Acute respiratory failure with hypoxia: Principal | ICD-10-CM | POA: Diagnosis present

## 2016-08-05 DIAGNOSIS — F329 Major depressive disorder, single episode, unspecified: Secondary | ICD-10-CM | POA: Diagnosis present

## 2016-08-05 DIAGNOSIS — Z7982 Long term (current) use of aspirin: Secondary | ICD-10-CM

## 2016-08-05 DIAGNOSIS — E039 Hypothyroidism, unspecified: Secondary | ICD-10-CM | POA: Diagnosis present

## 2016-08-05 DIAGNOSIS — Z825 Family history of asthma and other chronic lower respiratory diseases: Secondary | ICD-10-CM

## 2016-08-05 DIAGNOSIS — R0602 Shortness of breath: Secondary | ICD-10-CM | POA: Diagnosis not present

## 2016-08-05 DIAGNOSIS — I959 Hypotension, unspecified: Secondary | ICD-10-CM | POA: Diagnosis not present

## 2016-08-05 DIAGNOSIS — I110 Hypertensive heart disease with heart failure: Secondary | ICD-10-CM | POA: Diagnosis not present

## 2016-08-05 DIAGNOSIS — Z8249 Family history of ischemic heart disease and other diseases of the circulatory system: Secondary | ICD-10-CM | POA: Diagnosis not present

## 2016-08-05 DIAGNOSIS — I5032 Chronic diastolic (congestive) heart failure: Secondary | ICD-10-CM | POA: Diagnosis present

## 2016-08-05 DIAGNOSIS — R64 Cachexia: Secondary | ICD-10-CM | POA: Diagnosis present

## 2016-08-05 DIAGNOSIS — R6889 Other general symptoms and signs: Secondary | ICD-10-CM | POA: Diagnosis not present

## 2016-08-05 DIAGNOSIS — N183 Chronic kidney disease, stage 3 (moderate): Secondary | ICD-10-CM | POA: Diagnosis present

## 2016-08-05 DIAGNOSIS — Z66 Do not resuscitate: Secondary | ICD-10-CM | POA: Diagnosis present

## 2016-08-05 DIAGNOSIS — I130 Hypertensive heart and chronic kidney disease with heart failure and stage 1 through stage 4 chronic kidney disease, or unspecified chronic kidney disease: Secondary | ICD-10-CM | POA: Diagnosis present

## 2016-08-05 DIAGNOSIS — I5084 End stage heart failure: Secondary | ICD-10-CM | POA: Diagnosis present

## 2016-08-05 DIAGNOSIS — E7800 Pure hypercholesterolemia, unspecified: Secondary | ICD-10-CM | POA: Diagnosis present

## 2016-08-05 DIAGNOSIS — Z87891 Personal history of nicotine dependence: Secondary | ICD-10-CM

## 2016-08-05 DIAGNOSIS — K219 Gastro-esophageal reflux disease without esophagitis: Secondary | ICD-10-CM | POA: Diagnosis present

## 2016-08-05 DIAGNOSIS — Z806 Family history of leukemia: Secondary | ICD-10-CM | POA: Diagnosis not present

## 2016-08-05 DIAGNOSIS — I5021 Acute systolic (congestive) heart failure: Secondary | ICD-10-CM

## 2016-08-05 DIAGNOSIS — R404 Transient alteration of awareness: Secondary | ICD-10-CM | POA: Diagnosis not present

## 2016-08-05 DIAGNOSIS — F0390 Unspecified dementia without behavioral disturbance: Secondary | ICD-10-CM | POA: Diagnosis present

## 2016-08-05 DIAGNOSIS — Z515 Encounter for palliative care: Secondary | ICD-10-CM

## 2016-08-05 DIAGNOSIS — Z801 Family history of malignant neoplasm of trachea, bronchus and lung: Secondary | ICD-10-CM

## 2016-08-05 DIAGNOSIS — J9690 Respiratory failure, unspecified, unspecified whether with hypoxia or hypercapnia: Secondary | ICD-10-CM | POA: Diagnosis present

## 2016-08-05 MED ORDER — HALOPERIDOL 0.5 MG PO TABS
0.5000 mg | ORAL_TABLET | ORAL | Status: DC | PRN
  Filled 2016-08-05: qty 1

## 2016-08-05 MED ORDER — POLYVINYL ALCOHOL 1.4 % OP SOLN: 1.0000 [drp] | Freq: Four times a day (QID) | OPHTHALMIC | Status: DC | PRN

## 2016-08-05 MED ORDER — BIOTENE DRY MOUTH MT LIQD: 15.0000 mL | OROMUCOSAL | Status: DC | PRN

## 2016-08-05 MED ORDER — ACETAMINOPHEN 650 MG RE SUPP: 650.0000 mg | Freq: Four times a day (QID) | RECTAL | Status: DC | PRN

## 2016-08-05 MED ORDER — GLYCOPYRROLATE 0.2 MG/ML IJ SOLN
0.2000 mg | INTRAMUSCULAR | Status: DC | PRN
  Filled 2016-08-05: qty 1

## 2016-08-05 MED ORDER — MORPHINE BOLUS VIA INFUSION
2.0000 mg | INTRAVENOUS | Status: DC | PRN
  Filled 2016-08-05: qty 2

## 2016-08-05 MED ORDER — ONDANSETRON 4 MG PO TBDP: 4.0000 mg | ORAL_TABLET | Freq: Four times a day (QID) | ORAL | Status: DC | PRN

## 2016-08-05 MED ORDER — SODIUM CHLORIDE 0.9 % IV SOLN
1.0000 mg/h | INTRAVENOUS | Status: DC
  Administered 2016-08-05: 5 mg/h via INTRAVENOUS
  Filled 2016-08-05: qty 10

## 2016-08-05 MED ORDER — BIOTENE DRY MOUTH MT LIQD
15.0000 mL | OROMUCOSAL | Status: DC | PRN
Start: 2016-08-05 — End: 2016-08-05

## 2016-08-05 MED ORDER — HALOPERIDOL LACTATE 2 MG/ML PO CONC
0.5000 mg | ORAL | Status: DC | PRN
  Filled 2016-08-05: qty 0.3

## 2016-08-05 MED ORDER — HALOPERIDOL LACTATE 5 MG/ML IJ SOLN: 0.5000 mg | INTRAMUSCULAR | Status: DC | PRN

## 2016-08-05 MED ORDER — ONDANSETRON HCL 4 MG/2ML IJ SOLN: 4.0000 mg | Freq: Four times a day (QID) | INTRAMUSCULAR | Status: DC | PRN

## 2016-08-05 MED ORDER — GLYCOPYRROLATE 1 MG PO TABS: 1.0000 mg | ORAL_TABLET | ORAL | Status: DC | PRN

## 2016-08-05 MED ORDER — MORPHINE SULFATE (PF) 4 MG/ML IV SOLN
4.0000 mg | Freq: Once | INTRAVENOUS | Status: AC
  Administered 2016-08-05: 4 mg via INTRAVENOUS
  Filled 2016-08-05: qty 1

## 2016-08-05 MED ORDER — MORPHINE SULFATE (PF) 4 MG/ML IV SOLN
4.0000 mg | Freq: Once | INTRAVENOUS | Status: AC
Start: 2016-08-05 — End: 2016-08-05
  Administered 2016-08-05: 4 mg via INTRAVENOUS
  Filled 2016-08-05: qty 1

## 2016-08-05 MED ORDER — ACETAMINOPHEN 325 MG PO TABS: 650.0000 mg | ORAL_TABLET | Freq: Four times a day (QID) | ORAL | Status: DC | PRN

## 2016-08-05 NOTE — Progress Notes (Addendum)
CSW spoke to CM who stated pt was in need of Hospice Care and preferred [facility].  CSW spoke to [place] at [facility] which Dorothey Baseman has no currrent openings.  CSW spoke to pt's daughter Gloriann Loan at [phone] and provided her with a list of alternate Hospice agencies, but Miss Ezzard Flax stated she preferred the pt return to [facility] with Hospice.  CSW contacted Warden/ranger at [facility] and provided her with pt's daughter's name and phone number and provided pt's daughter with [facility] [place] name and number.  CSW updated RN. Please reconsult if future social work needs arise.    Alphonse Guild. Emir Nack, Latanya Presser, LCAS Clinical Social Worker Ph: [phone]

## 2016-08-05 NOTE — ED Notes (Signed)
Patient placed on 5 L O2 nasal canula. Weaning from NR. Sats 91%. Hx of COPD

## 2016-08-05 NOTE — Progress Notes (Signed)
Responded to consult for end of life. Per nurse, family had been there, but were not now. As charted, pt may pass by morning. Pt is [employer], so offered spoken Lord's Prayer and 23rd Psalm. Chaplain available for f/u.    07/29/2016 2100  Clinical Encounter Type  Visited With Patient;Health care provider  Visit Type Initial;Psychological support;Spiritual support;Social support (end of life)  Referral From Nurse   Gerrit Heck, Chaplain

## 2016-08-05 NOTE — ED Notes (Signed)
Placed on 6L O2  

## 2016-08-05 NOTE — ED Provider Notes (Addendum)
[facility] DEPT Provider Note   CSN: [id number] Arrival date & time: 08/01/2016  9371     History   Chief Complaint Chief Complaint  Patient presents with  . Shortness of Breath    HPI Michael Bradley is a 83 y.o. male.  Patient is an 83 year old male who was sent from a nursing home with respiratory distress. History is limited due to patient's unresponsive state. Per EMS report, patient has end-stage CHF as well as dementia and has had difficulty breathing that's worsened through the night. He does have the MOST form and a DO NOT RESUSCITATE which indicates that patient is comfort care however nursing facility states they weren't able to continue making the patient comfortable and sent him out for further treatment. It's unknown whether the patient is in hospice. Per EMS he is required a nonrebreather mask to maintain oxygen saturations. They've been unable to palpate a blood pressure.       Past Medical History:  Diagnosis Date  . Abnormal chest x-ray 06/2014   chronic stable bilateral interstitial opacities without focal consolidation  . Chest pain, atypical   . CHF (congestive heart failure) ([place])   . Dementia 2011  . ED (erectile dysfunction)   . History of echocardiogram    Echo 4/17: Mild LVH, focal basal hypertrophy, EF 55-60%, normal wall motion, calcified AV annulus, no aortic stenosis, severe LAE  . History of kidney stones   . Hyperlipidemia 2013  . Hypertension   . Hypothyroidism   . 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  . Microcytic anemia 12/2014  . Palpitations     Patient Active Problem List   Diagnosis Date Noted  . Advance care planning   . Altered mental status   . Acute encephalopathy 12/28/2015  . CKD (chronic kidney disease), stage III 12/28/2015  . Hypertension 12/28/2015  . GERD (gastroesophageal reflux disease) 12/28/2015  . Depression 12/28/2015  . Chronic diastolic CHF (congestive heart  failure) ([place]) 12/28/2015  . Bleeding gastrointestinal   . GI bleed 06/02/2015  . Delirium 06/02/2015  . UTI (lower urinary tract infection) 06/02/2015  . Chronic blood loss anemia 06/02/2015  . Hypothyroid 08/16/2011  . Vertigo 08/16/2011  . Dementia 12/07/2010  . Hypercholesterolemia 12/07/2010  . Benign hypertensive heart disease without heart failure 12/07/2010  . 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 SOB (shortness of breath) 12/07/2010    Past Surgical History:  Procedure Laterality Date  . CARDIOVASCULAR STRESS TEST  10/08/2002   EF 63%  . CATARACT EXTRACTION     os 2006-od 2010  . COLONOSCOPY  2009   Dr Delfin Edis: diverticulosis, hemorrhoids, small cecal polyp (tubular adenoma)  . COLONOSCOPY N/A 06/04/2015   Procedure: COLONOSCOPY;  Surgeon: Milus Banister, MD;  Location: [facility];  Service: Endoscopy;  Laterality: N/A;  . ESOPHAGOGASTRODUODENOSCOPY N/A 06/04/2015   Procedure: ESOPHAGOGASTRODUODENOSCOPY (EGD);  Surgeon: Milus Banister, MD;  Location: [facility];  Service: Endoscopy;  Laterality: N/A;  . LOBECTOMY  1971  . THORACOTOMY Right 1971   REMOVAL OF CARCINOID TUMOR       Home Medications    Prior to Admission medications   Medication Sig Start Date End Date Taking? Authorizing Provider  acetaminophen (TYLENOL) 325 MG tablet Take 325-650 mg by mouth every 6 (six) hours as needed for mild pain, moderate pain or headache.     [provider]  aspirin 81 MG tablet Take 81 mg  by mouth daily.      [provider]  atorvastatin (LIPITOR) 80 MG tablet Take 0.5 tablets (40 mg total) by mouth daily. Patient not taking: Reported on 02/25/2016 05/15/13   Darlin Coco, MD  chlorpheniramine (CHLOR-TRIMETON) 4 MG tablet Take 4 mg by mouth 2 (two) times daily as needed for allergies.    [provider]  ferrous sulfate 325 (65 FE) MG tablet Take 1 tablet (325 mg total) by mouth daily with breakfast. 06/05/15   Elgergawy, Silver Huguenin, MD    furosemide (LASIX) 20 MG tablet Take 1 tablet (20 mg total) by mouth daily. Patient not taking: Reported on 02/25/2016 07/07/14   Darlin Coco, MD  galantamine (RAZADYNE ER) 24 MG 24 hr capsule Take 1 capsule (24 mg total) by mouth daily with breakfast. Patient not taking: Reported on 02/25/2016 12/13/14   Star Age, MD  levothyroxine (SYNTHROID, LEVOTHROID) 50 MCG tablet Take 1 tablet (50 mcg total) by mouth daily before breakfast. Patient not taking: Reported on 02/25/2016 01/01/16   Raiford Noble Latif, DO  omeprazole (PRILOSEC) 20 MG capsule Take 20 mg by mouth at bedtime.     [provider]  Polyethyl Glycol-Propyl Glycol (SYSTANE OP) Place 1 drop into both ears daily.    [provider]  terazosin (HYTRIN) 2 MG capsule Take 4 mg by mouth at bedtime. 02/04/13   Darlin Coco, MD  vitamin B-12 (CYANOCOBALAMIN) 1000 MCG tablet Take 1,000 mcg by mouth daily.    [provider]    Family History Family History  Problem Relation Age of Onset  . Lung cancer Father   . Emphysema Father   . Lung cancer Brother   . Leukemia Mother   . Heart attack Brother   . Kidney cancer Brother     Social History Social History  Substance Use Topics  . Smoking status: Former Smoker    Packs/day: 1.00    Years: 30.00    Quit date: 11/28/1980  . Smokeless tobacco: Never Used  . Alcohol use No     Allergies   Lisinopril; Quinidine; and Quinolones   Review of Systems Review of Systems  Unable to perform ROS: Patient unresponsive     Physical Exam Updated Vital Signs BP (!) 76/64   Pulse (!) 106   Resp (!) 28   Ht 5\' 5"  (1.651 m)   Wt 100 lb (45.4 kg)   SpO2 93%   BMI 16.64 kg/m   Physical Exam  Constitutional: He appears well-developed. He appears distressed.  HENT:  Head: Normocephalic and atraumatic.  Eyes: Pupils are equal, round, and reactive to light.  Neck: Normal range of motion. Neck supple.  Cardiovascular: Regular rhythm and normal  heart sounds.  Tachycardia present.   Pulmonary/Chest: Accessory muscle usage present. Tachypnea noted. No respiratory distress. He has no wheezes. He has rhonchi. He has rales. He exhibits no tenderness.  Barrel chested  Abdominal: Soft. Bowel sounds are normal. There is no tenderness. There is no rebound and no guarding.  Musculoskeletal: Normal range of motion. He exhibits no edema.  Lymphadenopathy:    He has no cervical adenopathy.  Neurological: He is alert.  Patient is awake with eyes open but will not answer questions  Skin: Skin is warm and dry. No rash noted.  Psychiatric: He has a normal mood and affect.     ED Treatments / Results  Labs (all labs ordered are listed, but only abnormal results are displayed) Labs Reviewed - No data to display  EKG  EKG Interpretation  Date/Time:  Sunday Aug 05 2016 09:22:20 EDT Ventricular Rate:  112 PR Interval:    QRS Duration: 82 QT Interval:  327 QTC Calculation: 447 R Axis:   -55 Text Interpretation:  Sinus tachycardia Atrial premature complex Left anterior fascicular block Left ventricular hypertrophy Anterior Q waves, possibly due to LVH SINCE LAST TRACING HEART RATE HAS INCREASED Confirmed by Shalaya Swailes  MD, Plumer Mittelstaedt ([id number]) on 08/02/2016 1:32:34 PM       Radiology No results found.  Procedures Procedures (including critical care time)  Medications Ordered in ED Medications  morphine 4 MG/ML injection 4 mg (4 mg Intravenous Given 08/08/2016 0954)  morphine 4 MG/ML injection 4 mg (4 mg Intravenous Given 08/01/2016 1201)     Initial Impression / Assessment and Plan / ED Course  I have reviewed the triage vital signs and the nursing notes.  Pertinent labs & imaging results that were available during my care of the patient were reviewed by me and considered in my medical decision making (see chart for details).     9:30 I was able to contact Michael Bradley, the patient's niece and POA who states that pt is in hospice, at [facility]  and Palliative care of [facility] and wishes are for comfort measures only.  I did discuss with her that his blood pressure is very low and giving him medications to make him comfortable may lower his blood pressure even and she may end up dying today. She states that these are his wishes and that he has no quality of life currently. They want no medications to support his blood pressure or any other aggressive treatment. They wish for him to be comfort care only. He does have a DNR signed.  10:30 family at bedside. Patient has been given morphine and is more comfortable. He still requiring a nonrebreather mask. His blood pressure is still very low. Family is aware of patient's condition.  I spoke did a palliative care consult and was advised by hospice nurse to do a consult to case management to see if there is a room at [facility].  They say if no rooms there, pt should be admitted to hospital for comfort care and they can try to move to [facility].   I have contacted the case manager to will see if patient can be placed in [facility].    13:45 talked with social worker who states that there are no rooms at [facility].  We'll consult unassigned medicine for admission.  I spoke with the family medicine resident who will admit the pt   Final Clinical Impressions(s) / ED Diagnoses   Final diagnoses:  Acute systolic congestive heart failure ([place])  Hypotension, unspecified hypotension type    New Prescriptions New Prescriptions   No medications on file     Malvin Johns, MD 08/13/2016 1406    Malvin Johns, MD 08/10/2016 1414

## 2016-08-05 NOTE — Progress Notes (Signed)
[facility]  Received request from [place] for family interest in [facility]. Chart reviewed. Patient is currently followed by [facility] at ALF. Met with patient's POA [name] to confirm interest. Understand from RN patient is transferring up to [place]. Will follow up with family in am.   Thank you,  Erling Conte, LCSW [phone]

## 2016-08-05 NOTE — ED Triage Notes (Signed)
Patient comes in from [facility]. Pallative care. DNR with MOST form. Patient in respiratory distress. Ems v/s 43 RR on nonrebreather, rattling noted. 78 palp BP. 90% NR. 110 HR. 154 cbg. Patient non responsive to stimuli. Unable to follow commands. MD reaching out to family/facility for clarification of care.

## 2016-08-05 NOTE — Progress Notes (Signed)
CSW spoke to pt's provider who will be admitting the patient overnight.  CSW informed pt's daughter and Harmon Pier at [facility] who stated she will process the pt's case for possible admittance into [facility] should there be an opening on August 22, 2016.  CSW updated pt's RN.  Please reconsult if future social work needs arise.    Alphonse Guild. Jabaree Mercado, Latanya Presser, LCAS Clinical Social Worker Ph: [phone]

## 2016-08-05 NOTE — ED Notes (Signed)
Attempted report x1. 

## 2016-08-05 NOTE — ED Notes (Signed)
Sepsis order set not initiated. Patient comfort care. MD aware.

## 2016-08-05 NOTE — H&P (Signed)
[facility] Admission History and Physical Service Pager: [phone]  Patient name: Michael Bradley Medical record number: [id number] Date of birth: 02/04/1926 Age: 83 y.o. Gender: male  Primary Care Provider: Renata Caprice, DO Consultants: None Code Status: DNR  Chief Complaint: Difficulty Breathing  Assessment and Plan: Michael Bradley is a 83 y.o. male with a past medical history significant for dementia, HTN, hypothyroidism, depression and chronic diastolic HF who presented to the ED from [place] with respiratory distress.  #Hypoxic Respiratory failure, acute.  Patient presented to ED from nursing home with respiratory failure possibly from CHF vs pulmonary infection but we do not know exactly as no imaging or labs were ordered. Patient was tachypneic on admission and was placed on NRB mask. He is also hypotensive to 05-39J QBHALPFX/90-24O diastolic. Exam showing a cachetic obtunded male with bilateral rhodochrous breath sounds and tachypnea. His pulses are weak. He is actively dying. Niece (POA) states patient does not wish for aggressive treatment. Would like to pursue comfort care and Hospice when able. S/p 8 mg of Morphine given in ED. -- Admit to [place], med surg floor, attending Dr. Ardelia Mems -- Niece is POA Gloriann Loan) --Titrate oxygen as needed  --Palliative care has been consulted, appreciate recs --CSW consulted, appreciate assistance --Comfort care per End of Life order set --Continue Morphine for air hunger and comfort  #End stage dementia Patient has a history of dementia with rapid degradation in the past few months per relatives. Patient is non verbal, unable to respond or follow command. Patient is comfort care now, was previously palliative care. --Will continue to monitor pending transfer to hospice care  The rest of his medical problems will not be addressed specifically given comfort care status. With  signed DNR and MOST form.   FEN/GI: NS10 cc/hr Prophylaxis: None   Disposition: Anticipate discharge to [facility] in the next 24-48hrs to  History of Present Illness:  Michael Bradley is a 83 y.o. male with a past medical history significant for dementia, HTN, hypothryroidism, depression and chronic diastolic HF who presented to ED via EMS from  [place] with difficulty breathing.History is obtained from Niece and Nephew of the patient who is non verbal. Around 8:45 am EMS was called to the [facility] because staff noted a "gurgling noise" and difficulty breathing. EMS was called and patient was transported to [facility]. Patient is a DNR and has a MOST form indicating comfort care. ALF was not able to provide comfort for the patient and decided to call EMS. Patient is hospice and Palliative care status and wishes to be comfort care.  In the ED on admission, BP was 78/59, RR 16 and HR 115 with increase work of breathing and O2 sat of 93% on RA. Patient was placed on NRB mask and given 4 mg of morphine x2 which help settled him down.  Patient's POA would like patient to go to [facility] place for hospice care. SW contacted [facility] and they do not have an available bed. Patient will be admitted to hospital for comfort care in anticipation of possible transfer to [facility] tomorrow.  Review Of Systems: Per HPI with the following additions:   Review of Systems  Unable to perform ROS: Acuity of condition    Patient Active Problem List   Diagnosis Date Noted  . Advance care planning   . Altered mental status   . Acute encephalopathy 12/28/2015  . CKD (chronic kidney disease),  stage III 12/28/2015  . Hypertension 12/28/2015  . GERD (gastroesophageal reflux disease) 12/28/2015  . Depression 12/28/2015  . Chronic diastolic CHF (congestive heart failure) ([place]) 12/28/2015  . Bleeding gastrointestinal   . GI bleed 06/02/2015  . Delirium 06/02/2015  . UTI (lower urinary tract  infection) 06/02/2015  . Chronic blood loss anemia 06/02/2015  . Hypothyroid 08/16/2011  . Vertigo 08/16/2011  . Dementia 12/07/2010  . Hypercholesterolemia 12/07/2010  . Benign hypertensive heart disease without heart failure 12/07/2010  . 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 SOB (shortness of breath) 12/07/2010    Past Medical History: Past Medical History:  Diagnosis Date  . Abnormal chest x-ray 06/2014   chronic stable bilateral interstitial opacities without focal consolidation  . Chest pain, atypical   . CHF (congestive heart failure) ([place])   . Dementia 2011  . ED (erectile dysfunction)   . History of echocardiogram    Echo 4/17: Mild LVH, focal basal hypertrophy, EF 55-60%, normal wall motion, calcified AV annulus, no aortic stenosis, severe LAE  . History of kidney stones   . Hyperlipidemia 2013  . Hypertension   . Hypothyroidism   . 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  . Microcytic anemia 12/2014  . Palpitations     Past Surgical History: Past Surgical History:  Procedure Laterality Date  . CARDIOVASCULAR STRESS TEST  10/08/2002   EF 63%  . CATARACT EXTRACTION     os 2006-od 2010  . COLONOSCOPY  2009   Dr Delfin Edis: diverticulosis, hemorrhoids, small cecal polyp (tubular adenoma)  . COLONOSCOPY N/A 06/04/2015   Procedure: COLONOSCOPY;  Surgeon: Milus Banister, MD;  Location: [facility];  Service: Endoscopy;  Laterality: N/A;  . ESOPHAGOGASTRODUODENOSCOPY N/A 06/04/2015   Procedure: ESOPHAGOGASTRODUODENOSCOPY (EGD);  Surgeon: Milus Banister, MD;  Location: [facility];  Service: Endoscopy;  Laterality: N/A;  . LOBECTOMY  1971  . THORACOTOMY Right 1971   REMOVAL OF CARCINOID TUMOR    Social History: Social History  Substance Use Topics  . Smoking status: Former Smoker    Packs/day: 1.00    Years: 30.00    Quit date: 11/28/1980  . Smokeless tobacco: Never Used  . Alcohol use No   Additional social history:   Please also refer to relevant sections of EMR.  Family History: Family History  Problem Relation Age of Onset  . Lung cancer Father   . Emphysema Father   . Lung cancer Brother   . Leukemia Mother   . Heart attack Brother   . Kidney cancer Brother    (If not completed, MUST add something in)  Allergies and Medications: Allergies  Allergen Reactions  . Lisinopril Cough  . Quinidine   . Quinolones Other (See Comments)    Pt doesn't recall   No current facility-administered medications on file prior to encounter.    Current Outpatient Prescriptions on File Prior to Encounter  Medication Sig Dispense Refill  . acetaminophen (TYLENOL) 325 MG tablet Take 325-650 mg by mouth every 6 (six) hours as needed for mild pain, moderate pain or headache.     Marland Kitchen aspirin 81 MG tablet Take 81 mg by mouth daily.      Marland Kitchen atorvastatin (LIPITOR) 80 MG tablet Take 0.5 tablets (40 mg total) by mouth daily. (Patient not taking: Reported on 02/25/2016) 45 tablet 2  . chlorpheniramine (CHLOR-TRIMETON) 4 MG tablet Take 4 mg by mouth 2 (two) times daily as needed  for allergies.    . ferrous sulfate 325 (65 FE) MG tablet Take 1 tablet (325 mg total) by mouth daily with breakfast. 30 tablet 3  . furosemide (LASIX) 20 MG tablet Take 1 tablet (20 mg total) by mouth daily. (Patient not taking: Reported on 02/25/2016) 30 tablet 5  . galantamine (RAZADYNE ER) 24 MG 24 hr capsule Take 1 capsule (24 mg total) by mouth daily with breakfast. (Patient not taking: Reported on 02/25/2016) 90 capsule 3  . levothyroxine (SYNTHROID, LEVOTHROID) 50 MCG tablet Take 1 tablet (50 mcg total) by mouth daily before breakfast. (Patient not taking: Reported on 02/25/2016) 30 tablet 0  . omeprazole (PRILOSEC) 20 MG capsule Take 20 mg by mouth at bedtime.     Vladimir Faster Glycol-Propyl Glycol (SYSTANE OP) Place 1 drop into both ears daily.    Marland Kitchen terazosin (HYTRIN) 2 MG capsule Take 4 mg by mouth at bedtime.    . vitamin B-12 (CYANOCOBALAMIN)  1000 MCG tablet Take 1,000 mcg by mouth daily.      Objective: BP (!) 76/64   Pulse (!) 106   Resp (!) 28   Ht 5\' 5"  (1.651 m)   Wt 100 lb (45.4 kg)   SpO2 93%   BMI 16.64 kg/m   Exam: General: Cachetic, frail, unresponsive man, in some distress, unable to participate in exam Cardiac: RRR, weak pulses bilaterally Respiratory: diffused rhonchi bilaterally, tachypnea Abdomen: soft, nontender, nondistended, no hepatic or splenomegaly, +BS Extremities: no edema or cyanosis. WWP. Skin: warm and dry, no rashes noted Neuro: Unresponsive, unable to follow command   Labs and Imaging: CBC BMET  No results for input(s): WBC, HGB, HCT, PLT in the last 168 hours. No results for input(s): NA, K, CL, CO2, BUN, CREATININE, GLUCOSE, CALCIUM in the last 168 hours.    Marjie Skiff, MD 08/03/2016, 2:07 PM PGY-, [place] Intern pager: [phone], text pages welcome  I have separately seen and examined the patient. I have discussed the findings and exam with Dr Andy Gauss and agree with the above note.  My changes/additions are outlined in BLUE.   Smitty Cords, MD [place], PGY-2

## 2016-08-15 DIAGNOSIS — F0150 Vascular dementia without behavioral disturbance: Secondary | ICD-10-CM | POA: Diagnosis not present

## 2016-08-15 DIAGNOSIS — I519 Heart disease, unspecified: Secondary | ICD-10-CM | POA: Diagnosis not present

## 2016-08-15 DIAGNOSIS — F3341 Major depressive disorder, recurrent, in partial remission: Secondary | ICD-10-CM | POA: Diagnosis not present

## 2016-08-15 DIAGNOSIS — D649 Anemia, unspecified: Secondary | ICD-10-CM | POA: Diagnosis not present

## 2016-08-15 DIAGNOSIS — H04123 Dry eye syndrome of bilateral lacrimal glands: Secondary | ICD-10-CM | POA: Diagnosis not present

## 2016-08-15 DIAGNOSIS — D510 Vitamin B12 deficiency anemia due to intrinsic factor deficiency: Secondary | ICD-10-CM | POA: Diagnosis not present

## 2016-08-17 NOTE — Progress Notes (Signed)
Postmortem Note: 03-Sep-2016 at 0600  Assessment: On assessment patient had no respirations or heart sounds for over 2 minutes. Assessed patient pulse, which resulted in being pulseless. Extremities were cold to touch and mottled.   Confirmation: Fredrich Romans, RN confirmed these findings as well.   Time of Death: [place] [facility]: Patient is not a candidate for organ or tissue donation. Spoke with Leonor Liv. [phone]  Family: Not at bedside. Gloriann Loan (niece) notified.  Patient Placement: Notified  Attending: Dr. Lindell Noe notified   Candise [place] RN

## 2016-08-17 NOTE — Progress Notes (Signed)
Informed that patient has passed. Attempted to call patient's niece. No answer and no option to leave voicemail. Will try again shortly.   Smitty Cords, MD [place], PGY-2

## 2016-08-17 NOTE — Discharge Summary (Signed)
[facility] Discharge Summary  Patient name: Michael Bradley Medical record number: [id number] Date of birth: Aug 15, 1925 Age: 83 y.o. Gender: male Date of Admission: Aug 08, 2016  Date of Death: 08-09-16 Admitting Physician: Leeanne Rio, MD  Primary Care Provider: Renata Caprice, DO Consultants: Palliative Care  Indication for Hospitalization: Respiratory Failure  Problem List:  End stage dementia HTN Hypothyroidism Depression Chronic diastolic HF  Disposition: deceased while hospitalized, release to the care of family  Condition: Deceased  Physical Exam: General: Cachetic, frail, unresponsive man, in some distress, unable to participate in exam Cardiac: RRR, weak pulses bilaterally Respiratory: diffused rhonchi bilaterally, tachypnea Abdomen: soft, nontender, nondistended, no hepatic or splenomegaly, +BS Extremities: no edema or cyanosis. WWP. Skin: warm and dry, no rashes noted Neuro: Unresponsive, unable to follow command  Brief Hospital Course:  Michael Bradley is a 83 y.o. male with a past medical history significant for dementia, HTN, hypothyroidism, depression and chronic diastolic HF who presented to the ED from [place] with respiratory distress . Patient was found to be DNR on palliative care and was subsequently placed on comfort care/hospice. Patient was on NRB mask for his respiratory failure and was started on morphine. Palliative care was consulted to assist. Patient was on hold for transfer to [facility] on Aug 10, 2022. Patient was transfer to the floor and place on 6L O2 and started on morphine infusion with no other intervention. Patient passed early in the morning on 2022/08/10. Family was notified by primary team.   Issues for Follow Up:  None  Significant Procedures: None   Significant Labs and Imaging:  No results for input(s): WBC, HGB, HCT, PLT in the last 168 hours. No results for input(s): NA,  K, CL, CO2, GLUCOSE, BUN, CREATININE, CALCIUM, MG, PHOS, ALKPHOS, AST, ALT, ALBUMIN, PROTEIN in the last 168 hours.  Invalid input(s): TBILI    Results/Tests Pending at Time of Discharge: None  Discharge Medications:  Allergies as of 2016-08-09      Reactions   Lisinopril Cough   Quinidine    Quinolones Other (See Comments)   Pt doesn't recall      Medication List    ASK your doctor about these medications   acetaminophen 325 MG tablet Commonly known as:  TYLENOL Take 325-650 mg by mouth every 6 (six) hours as needed for mild pain, moderate pain or headache.   aspirin 81 MG tablet Take 81 mg by mouth daily.   atorvastatin 80 MG tablet Commonly known as:  LIPITOR Take 0.5 tablets (40 mg total) by mouth daily.   chlorpheniramine 4 MG tablet Commonly known as:  CHLOR-TRIMETON Take 4 mg by mouth 2 (two) times daily as needed for allergies.   ferrous sulfate 325 (65 FE) MG tablet Take 1 tablet (325 mg total) by mouth daily with breakfast.   furosemide 20 MG tablet Commonly known as:  LASIX Take 1 tablet (20 mg total) by mouth daily.   galantamine 24 MG 24 hr capsule Commonly known as:  RAZADYNE ER Take 1 capsule (24 mg total) by mouth daily with breakfast.   levothyroxine 50 MCG tablet Commonly known as:  SYNTHROID, LEVOTHROID Take 1 tablet (50 mcg total) by mouth daily before breakfast.   omeprazole 20 MG capsule Commonly known as:  PRILOSEC Take 20 mg by mouth at bedtime.   SYSTANE OP Place 1 drop into both ears daily.   terazosin 2 MG capsule Commonly known as:  HYTRIN Take 4 mg by mouth  at bedtime.   vitamin B-12 1000 MCG tablet Commonly known as:  CYANOCOBALAMIN Take 1,000 mcg by mouth daily.       Discharge Instructions: Please refer to Patient Instructions section of EMR for full details.  Patient was counseled important signs and symptoms that should prompt return to medical care, changes in medications, dietary instructions, activity  restrictions, and follow up appointments.   Follow-Up Appointments:   Marjie Skiff, MD 08/07/2016, 7:27 AM PGY-1, [place]

## 2016-08-17 NOTE — Consult Note (Signed)
           [facility] CM Primary Care Navigator  08/16/16  Michael Bradley [birth year] [id number]   Wentto see patient at the bedside to identify possible discharge needs but staffreports that patient expired this morning.  Patient provider's office (per BI Report- Dr. Cleatrice Burke) called and spoke with Romie Minus who stated that he is not listed as a patient under Dr. Cathie Olden.  Attempted to call Renata Caprice, DO office [phone]) but was continuously ringing and unable to leave a message.  For questions, please contact:  Dannielle Huh, BSN, RN- [facility] Primary Care Navigator  Telephone: [phone] [place]

## 2016-08-17 NOTE — Progress Notes (Signed)
Wasted Morphine 150mg  IVPB with Fredrich Romans, RN

## 2016-08-17 DEATH — deceased

## 2016-12-17 IMAGING — MR MR HEAD W/O CM
9 of 10 series · 37 of 48 positions shown · non-contrast
Comparison: Head CT without contrast 12/28/2015. Brain MRI
12/16/2010.

CLINICAL DATA: 89-year-old male with altered mental status.
Increased confusion and agitation. Initial encounter.

EXAM:
MRI HEAD WITHOUT CONTRAST
TECHNIQUE: Multiplanar, multiecho pulse sequences of the brain and surrounding
structures were obtained without intravenous contrast.

[Series 3: DWI · axial · 3.0mm · 1.09mm/px · z∈[+0,+132]mm · 11 of 96 slices shown (1 of 4)]
[im 1/96]
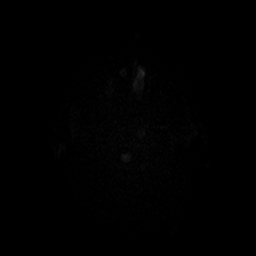
[im 10/96]
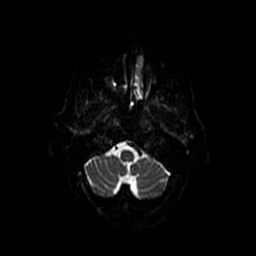
[im 20/96]
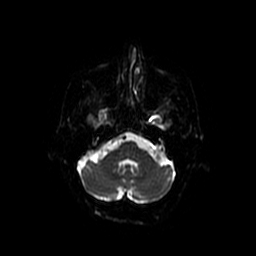
[im 29/96]
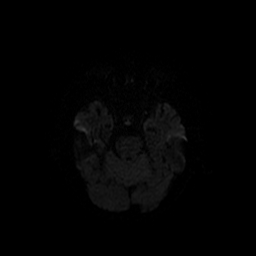
[im 39/96]
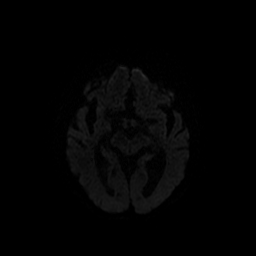
[im 48/96]
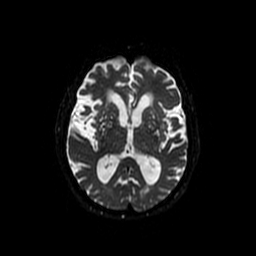
[im 58/96]
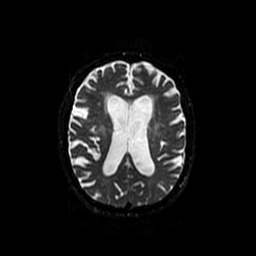
[im 67/96]
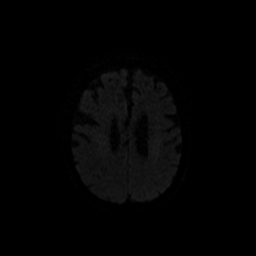
[im 77/96]
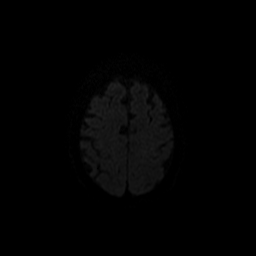
[im 86/96]
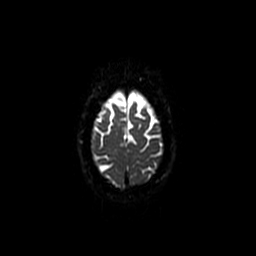
[im 96/96]
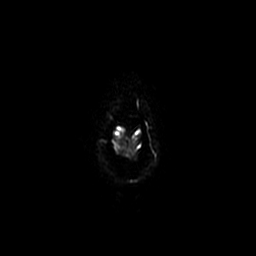

[Series 4: T2 · axial · 5.0mm · 0.47mm/px · z∈[-7,+122]mm · 2 of 24 slices shown (1 of 2)]
[im 1/24]
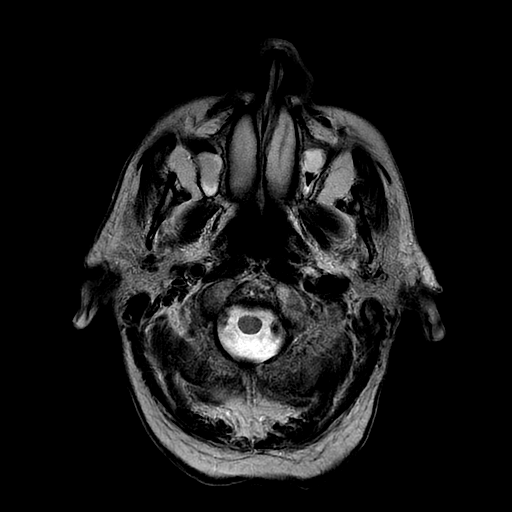
[im 24/24]
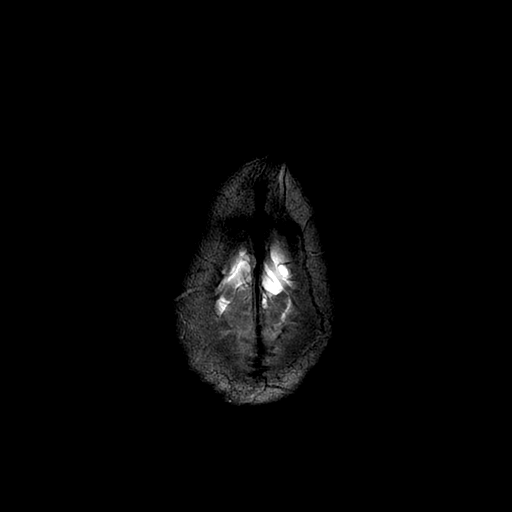

[Series 5: FLAIR · axial · 5.0mm · 0.47mm/px · z∈[-7,+122]mm · 2 of 24 slices shown]
[im 1/24]
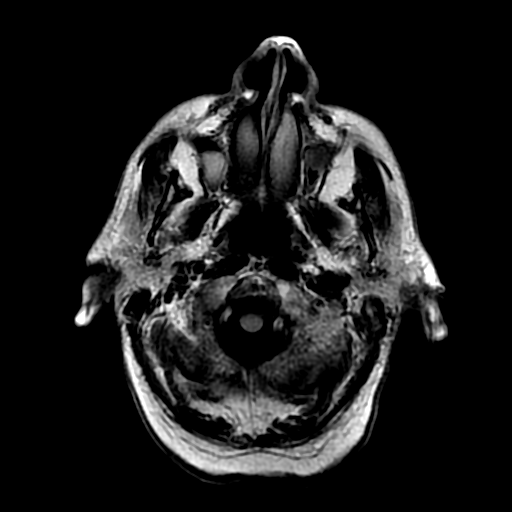
[im 24/24]
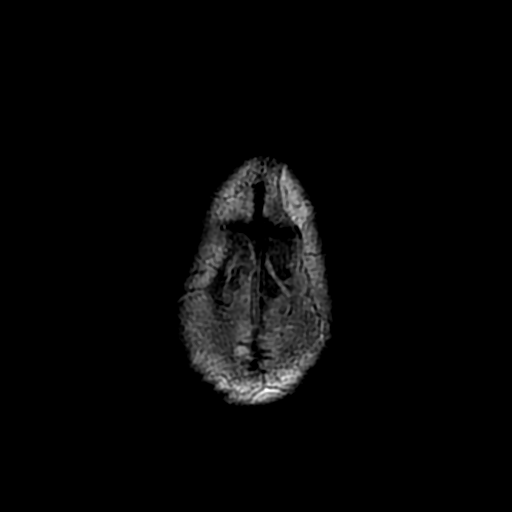

[Series 6: DWI · coronal · 5.0mm · 1.09mm/px · 7 of 68 slices shown (2 of 4)]
[im 1/68]
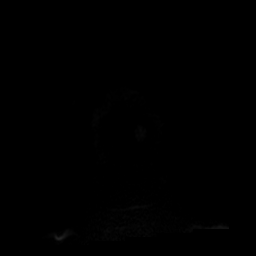
[im 12/68]
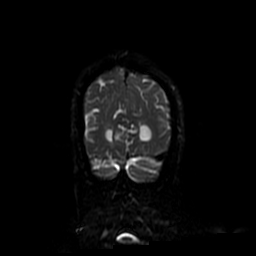
[im 23/68]
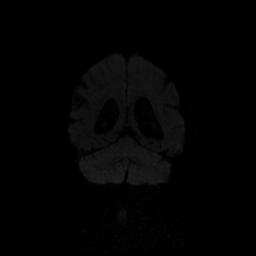
[im 34/68]
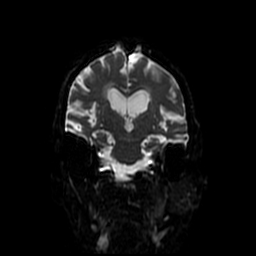
[im 45/68]
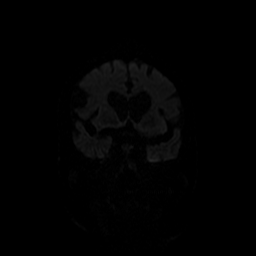
[im 56/68]
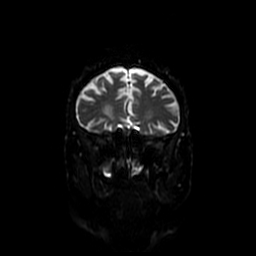
[im 68/68]
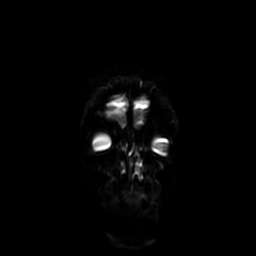

[Series 7: T1 · sagittal · 5.0mm · 0.47mm/px · 2 of 23 slices shown]
[im 1/23]
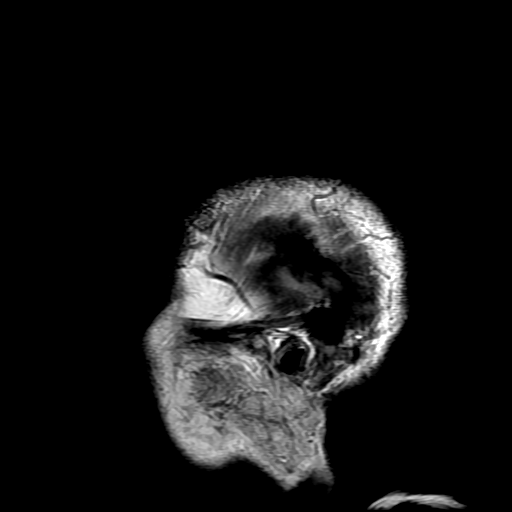
[im 23/23]
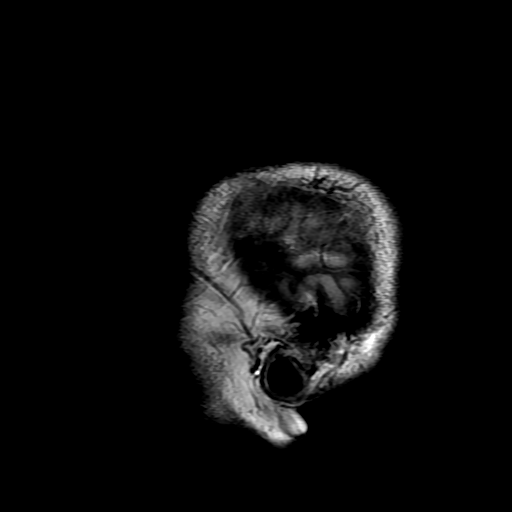

[Series 8: ax mpgr · axial · 5.0mm · 0.47mm/px · 1 of 24 slices shown]
[im 1/24]
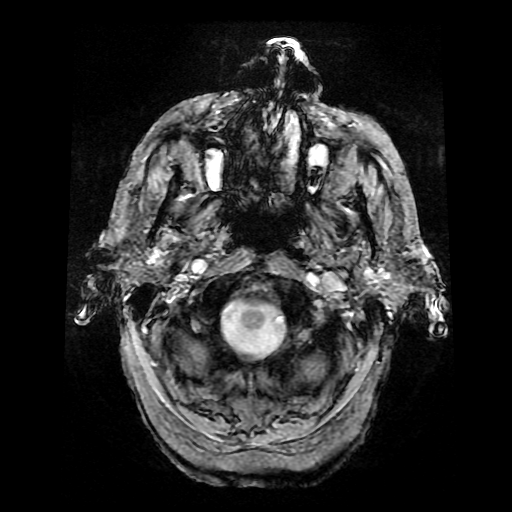

[Series 10: T2 · coronal · 5.0mm · 0.43mm/px · 3 of 29 slices shown (2 of 2)]
[im 1/29]
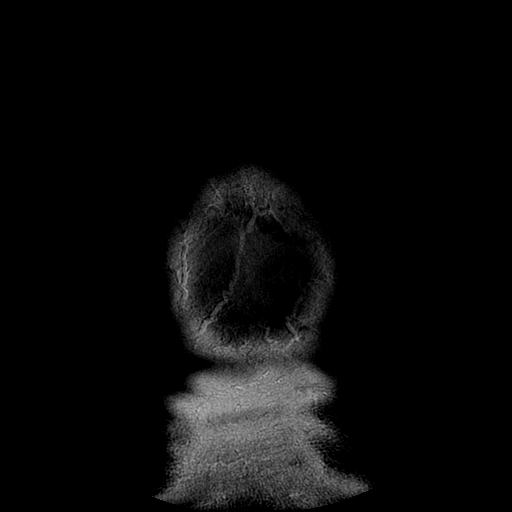
[im 15/29]
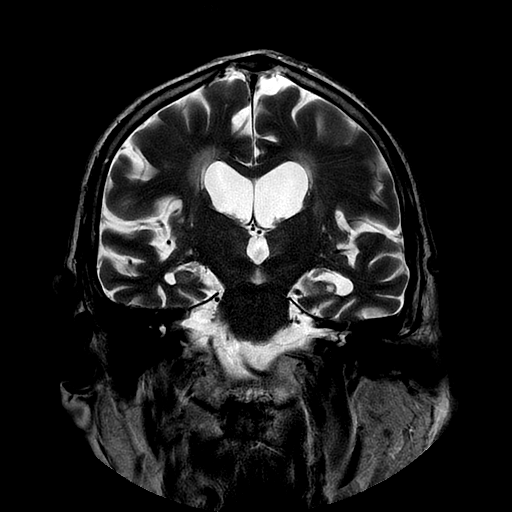
[im 29/29]
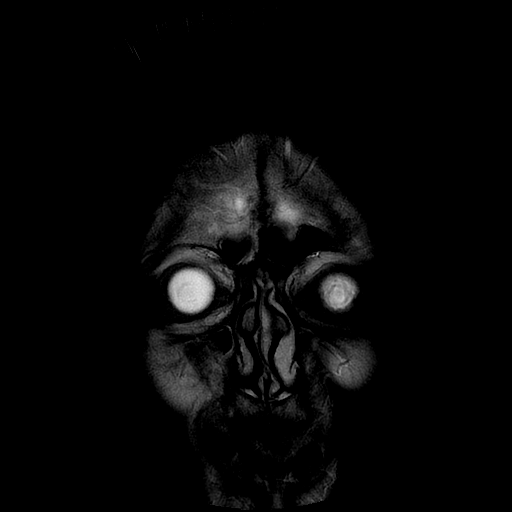

[Series 300: DWI · axial · 3.0mm · 1.09mm/px · z∈[+0,+132]mm · 5 of 48 slices shown (3 of 4)]
[im 1/48]
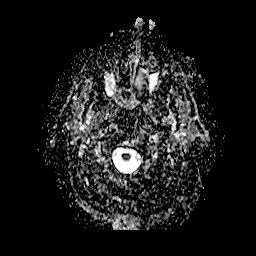
[im 12/48]
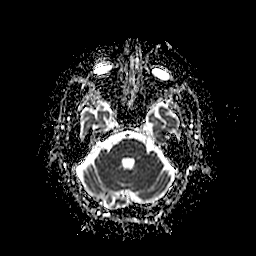
[im 24/48]
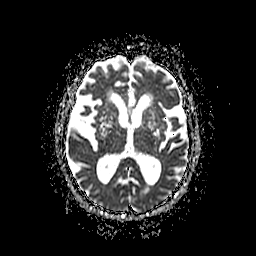
[im 36/48]
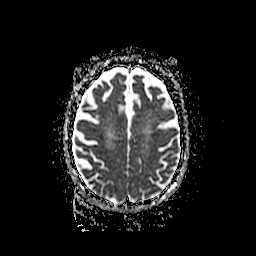
[im 48/48]
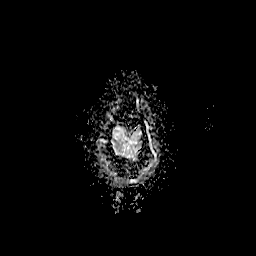

[Series 600: DWI · coronal · 5.0mm · 1.09mm/px · 4 of 34 slices shown (4 of 4)]
[im 1/34]
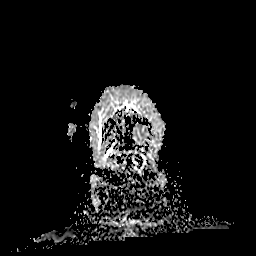
[im 12/34]
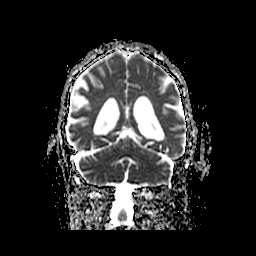
[im 23/34]
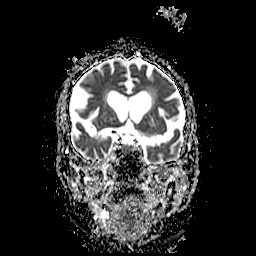
[im 34/34]
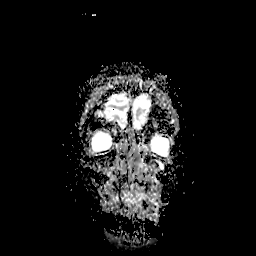

[37 of 48 positions shown; findings below may reference images not displayed]

FINDINGS: Brain: No restricted diffusion to suggest acute infarction. No
midline shift, mass effect, evidence of mass lesion,
ventriculomegaly, extra-axial collection or acute intracranial
hemorrhage. Cervicomedullary junction and pituitary are within
normal limits.

Cerebral volume loss since 9569, appears fairly generalized. Chronic
increased T2 signal in the deep gray matter nuclei appears primarily
due to perivascular spaces. Progressed and mildly confluent cerebral
white matter T2 and FLAIR hyperintensity which is mostly
periventricular. There is some deep white matter capsule
involvement. No cortical encephalomalacia. No chronic cerebral blood
products identified. Negative brainstem and cerebellum.

Vascular: Major intracranial vascular flow voids appear stable with
a mild to moderate degree of generalized intracranial artery
dolichoectasia.

Skull and upper cervical spine: Negative. Normal bone marrow signal.

Sinuses/Orbits: Chronic maxillary sinus mucous retention cyst. Other
paranasal sinuses remain clear. Orbits soft tissues appear stable.
Negative scalp soft tissues.

Other: Visible internal auditory structures appear normal. Mastoids
are clear.
IMPRESSION: 1.  No acute intracranial abnormality.
2. Evidence of chronic small vessel disease with mild progression
since 9569. Interval cerebral volume loss which appears fairly
generalized.

## 2017-02-13 IMAGING — DX DG CHEST 2V
2 series · 2 of 2 positions shown · non-contrast
Comparison: 12/28/2015

CLINICAL DATA: Fall, history hypertension, CHF, carcinoid tumor of
the lung post lobectomy

EXAM:
CHEST  2 VIEW

[chest lat]
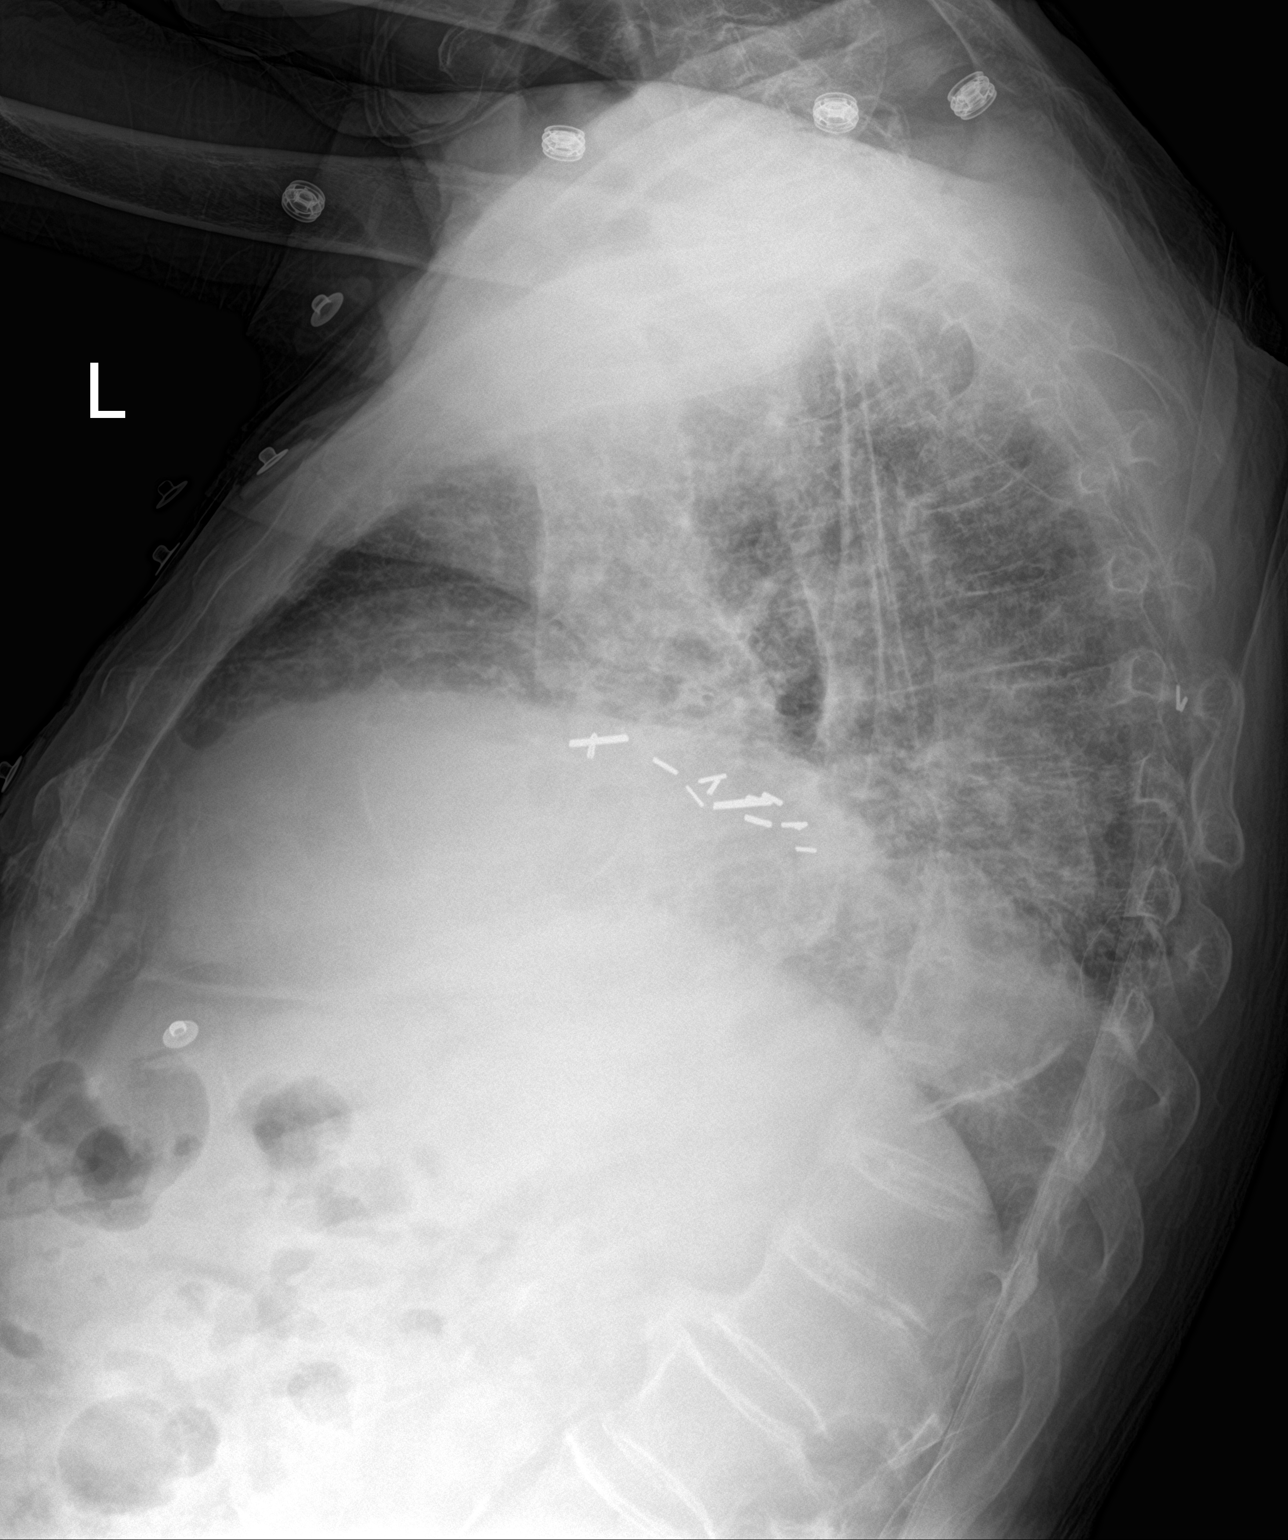

[chest ap]
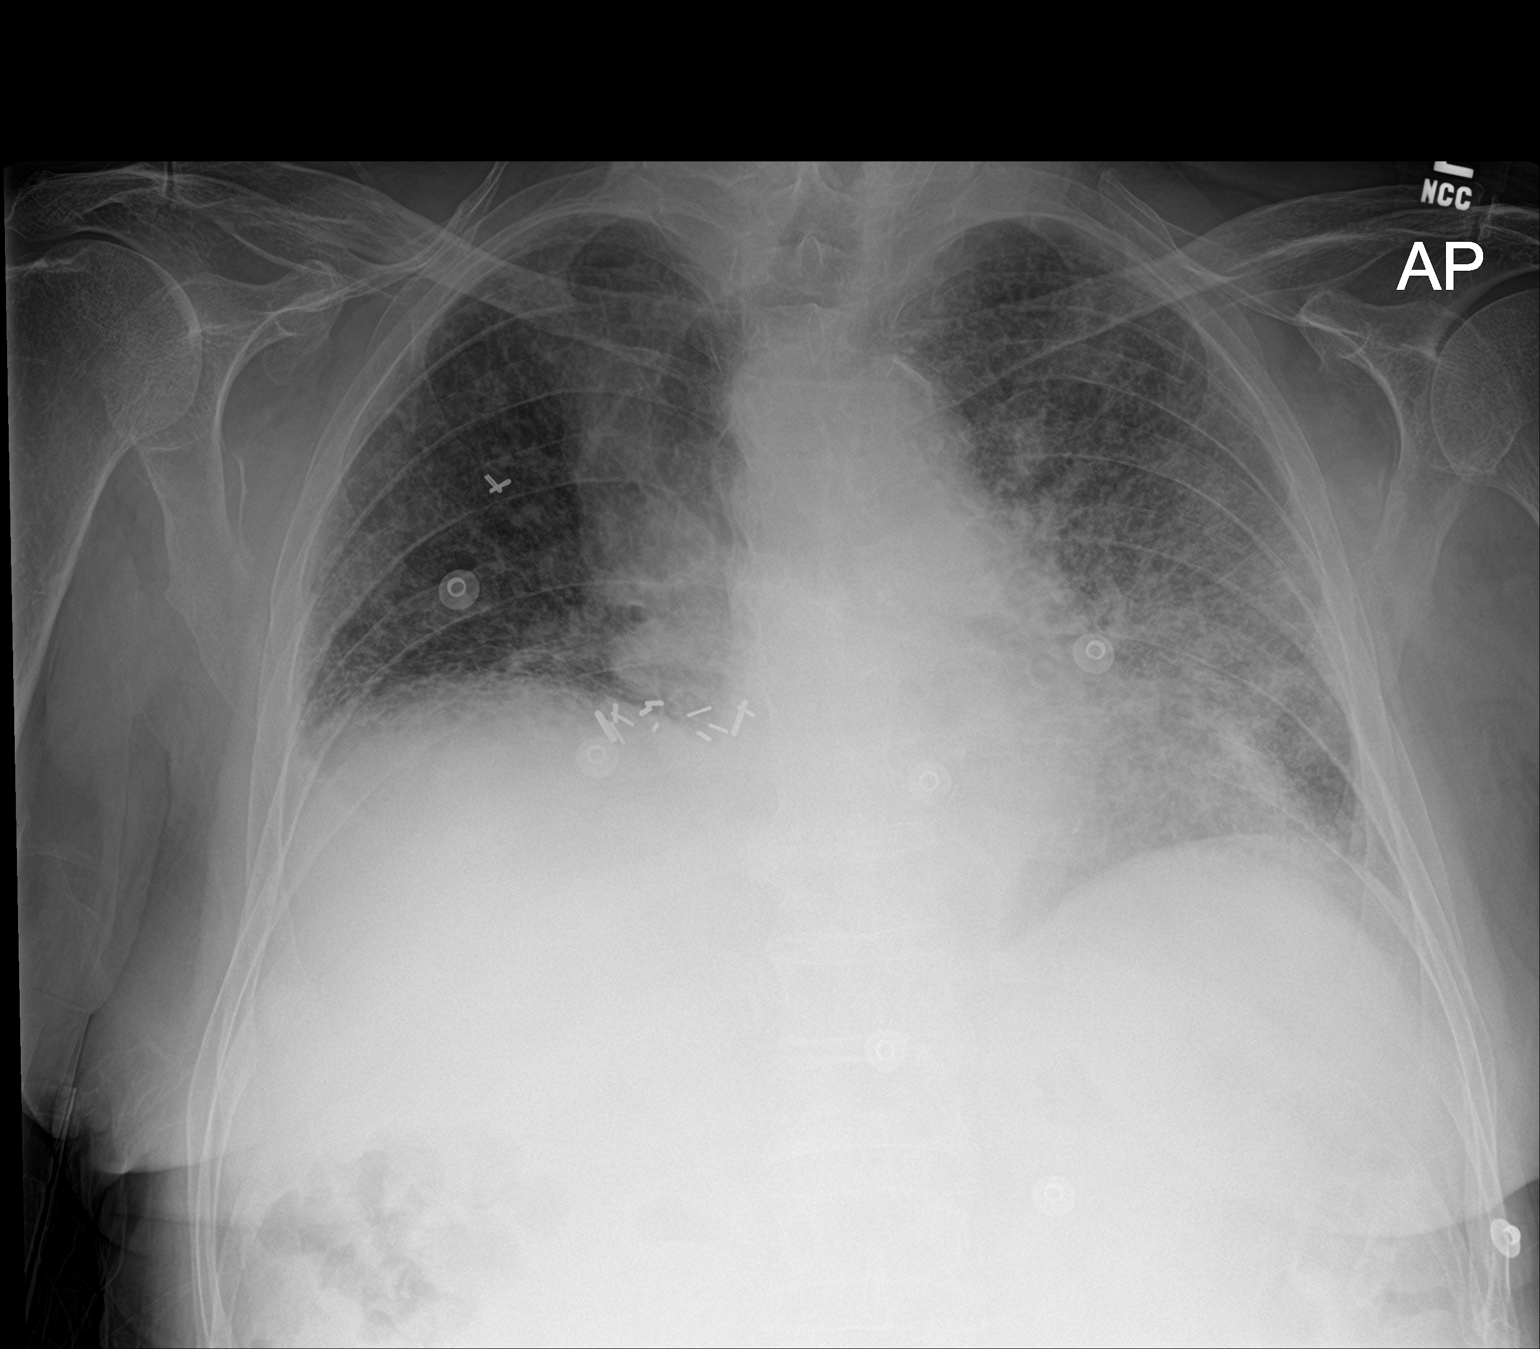

[2 of 2 positions shown; findings below may reference images not displayed]

FINDINGS: Enlargement of cardiac silhouette.

Chronic elevation of RIGHT diaphragm with evidence of prior RIGHT
basilar lung resection.

Atherosclerotic calcification aorta.

Extensive interstitial infiltrates throughout both lungs greater on
LEFT similar to previous exam.

Scarring versus atelectasis at RIGHT base.

No pleural effusion or pneumothorax.

Bones demineralized.
IMPRESSION: Enlargement of cardiac silhouette.

Postsurgical changes RIGHT hemithorax from prior lobectomy.

Chronic asymmetric interstitial infiltrates LEFT greater than RIGHT,
question recurrent or persistent pulmonary edema versus chronic
interstitial lung disease.
